# Patient Record
Sex: Male | Born: 1965 | Race: Black or African American | Hispanic: No | Marital: Married | Smoking: Never smoker
Health system: Southern US, Community
[De-identification: ages and names within clinical notes are randomized; demographics above are authoritative.]

## PROBLEM LIST (undated history)

## (undated) DIAGNOSIS — L03119 Cellulitis of unspecified part of limb: Secondary | ICD-10-CM

## (undated) DIAGNOSIS — K5792 Diverticulitis of intestine, part unspecified, without perforation or abscess without bleeding: Secondary | ICD-10-CM

## (undated) DIAGNOSIS — I10 Essential (primary) hypertension: Secondary | ICD-10-CM

## (undated) DIAGNOSIS — L02419 Cutaneous abscess of limb, unspecified: Secondary | ICD-10-CM

## (undated) HISTORY — PX: COLON SURGERY: SHX602

---

## 1999-11-16 ENCOUNTER — Emergency Department (HOSPITAL_COMMUNITY): Admission: EM | Admit: 1999-11-16 | Discharge: 1999-11-16 | Payer: Self-pay | Admitting: Emergency Medicine

## 1999-11-16 ENCOUNTER — Encounter: Payer: Self-pay | Admitting: Emergency Medicine

## 2012-04-19 ENCOUNTER — Encounter (HOSPITAL_BASED_OUTPATIENT_CLINIC_OR_DEPARTMENT_OTHER): Payer: Self-pay | Admitting: *Deleted

## 2012-04-19 ENCOUNTER — Emergency Department (HOSPITAL_BASED_OUTPATIENT_CLINIC_OR_DEPARTMENT_OTHER)
Admission: EM | Admit: 2012-04-19 | Discharge: 2012-04-19 | Disposition: A | Discharge status: Home / Self Care | Payer: Managed Care, Other (non HMO) | Attending: Emergency Medicine | Admitting: Emergency Medicine

## 2012-04-19 ENCOUNTER — Emergency Department (HOSPITAL_BASED_OUTPATIENT_CLINIC_OR_DEPARTMENT_OTHER): Payer: Managed Care, Other (non HMO)

## 2012-04-19 DIAGNOSIS — E119 Type 2 diabetes mellitus without complications: Secondary | ICD-10-CM | POA: Insufficient documentation

## 2012-04-19 DIAGNOSIS — N498 Inflammatory disorders of other specified male genital organs: Secondary | ICD-10-CM | POA: Insufficient documentation

## 2012-04-19 DIAGNOSIS — N492 Inflammatory disorders of scrotum: Secondary | ICD-10-CM

## 2012-04-19 DIAGNOSIS — I10 Essential (primary) hypertension: Secondary | ICD-10-CM | POA: Insufficient documentation

## 2012-04-19 HISTORY — DX: Diverticulitis of intestine, part unspecified, without perforation or abscess without bleeding: K57.92

## 2012-04-19 HISTORY — DX: Essential (primary) hypertension: I10

## 2012-04-19 LAB — URINALYSIS, ROUTINE W REFLEX MICROSCOPIC
Bilirubin Urine: NEGATIVE
Glucose, UA: >1000 mg/dL — AB
Ketones, ur: NEGATIVE mg/dL
Nitrite: NEGATIVE
Protein, ur: NEGATIVE mg/dL
Specific Gravity, Urine: 1.033 — ABNORMAL HIGH (ref 1.005–1.030)
Urobilinogen, UA: 1 mg/dL (ref 0.0–1.0)
pH: 5.5 (ref 5.0–8.0)

## 2012-04-19 LAB — URINE MICROSCOPIC-ADD ON

## 2012-04-19 MED ORDER — CLONIDINE HCL 0.1 MG PO TABS
0.1000 mg | ORAL_TABLET | Freq: Once | ORAL | Status: AC
  Administered 2012-04-19: 0.1 mg via ORAL
  Filled 2012-04-19: qty 1

## 2012-04-19 MED ORDER — SULFAMETHOXAZOLE-TRIMETHOPRIM 800-160 MG PO TABS: 1.0000 | ORAL_TABLET | Freq: Two times a day (BID) | ORAL | Status: AC

## 2012-04-19 MED ORDER — LIDOCAINE-EPINEPHRINE 2 %-1:100000 IJ SOLN: 20.0000 mL | Freq: Once | INTRAMUSCULAR | Status: DC

## 2012-04-19 MED ORDER — AMLODIPINE BESYLATE 5 MG PO TABS
5.0000 mg | ORAL_TABLET | Freq: Once | ORAL | Status: AC
  Administered 2012-04-19: 5 mg via ORAL
  Filled 2012-04-19: qty 1

## 2012-04-19 MED ORDER — HYDROCODONE-ACETAMINOPHEN 5-325 MG PO TABS
2.0000 | ORAL_TABLET | Freq: Once | ORAL | Status: AC
  Administered 2012-04-19: 2 via ORAL
  Filled 2012-04-19: qty 2

## 2012-04-19 MED ORDER — HYDROCODONE-ACETAMINOPHEN 5-500 MG PO TABS: 1.0000 | ORAL_TABLET | Freq: Four times a day (QID) | ORAL | Status: AC | PRN

## 2012-04-19 MED ORDER — HYDROMORPHONE HCL PF 1 MG/ML IJ SOLN
1.0000 mg | Freq: Once | INTRAMUSCULAR | Status: AC
  Administered 2012-04-19: 1 mg via INTRAMUSCULAR
  Filled 2012-04-19: qty 1

## 2012-04-19 MED ORDER — SULFAMETHOXAZOLE-TMP DS 800-160 MG PO TABS
1.0000 | ORAL_TABLET | Freq: Once | ORAL | Status: AC
  Administered 2012-04-19: 1 via ORAL
  Filled 2012-04-19: qty 1

## 2012-04-19 MED ORDER — SULFAMETHOXAZOLE-TRIMETHOPRIM 800-160 MG PO TABS: 1.0000 | ORAL_TABLET | Freq: Two times a day (BID) | ORAL | Status: DC

## 2012-04-19 NOTE — Discharge Instructions (Signed)
Keep area of incision and drainage very clean/dry. Take motrin or aleve as need.  You may also take vicodin as need for pain. No driving for the next 6 hours or when taking vicodin. Also, do not take tylenol or acetaminophen containing medication when taking vicodin.  Return to your doctor, or here, this Monday morning for recheck and possible packing removal.  Return to ER right away if worse, increased swelling, spreading redness, worsening or severe pain, fevers, severe headache, other concern.  Your blood pressure is high - take one extra of your sular tablets each day for the next couple days, and follow up with primary care doctor for recheck this Monday.      Incision and Drainage Incision and drainage (I&D) is a procedure in which a cavity-like structure (cystic structure) is opened and drained. The cyst to be drained usually contains material such as pus, fluid, or blood. Gauze is sometimes packed into the cut (incision). Keeping a drain or piece of gauze in the incision keeps the skin from healing first. This helps stop the cyst from forming again. HOME CARE INSTRUCTIONS   Only take over-the-counter or prescription medicines for pain, discomfort, or fever as directed by your caregiver. Use these only if your caregiver has not given medicines that would interfere.   See your caregiver as directed for a recheck.   If medicines (antibiotics) that kill germs were prescribed, take them as directed.  SEEK MEDICAL CARE IF:   You develop increased pain, swelling, redness, drainage, or bleeding in the wound.   You develop signs of an infection. These signs include muscle aches, chills, or a general ill feeling.   You have a fever.  MAKE SURE YOU:   Understand these instructions.   Will watch your condition.   Will get help right away if you are not doing well or get worse.  Document Released: 05/15/2001 Document Revised: 11/08/2011 Document Reviewed: 07/09/2008 [facility] Patient  Information 2012 [place], [place].      Abscess An abscess (boil or furuncle) is an infected area that contains a collection of pus.  SYMPTOMS Signs and symptoms of an abscess include pain, tenderness, redness, or hardness. You may feel a moveable soft area under your skin. An abscess can occur anywhere in the body.  TREATMENT  A surgical cut (incision) may be made over your abscess to drain the pus. Gauze may be packed into the space or a drain may be looped through the abscess cavity (pocket). This provides a drain that will allow the cavity to heal from the inside outwards. The abscess may be painful for a few days, but should feel much better if it was drained.  Your abscess, if seen early, may not have localized and may not have been drained. If not, another appointment may be required if it does not get better on its own or with medications. HOME CARE INSTRUCTIONS   Only take over-the-counter or prescription medicines for pain, discomfort, or fever as directed by your caregiver.   Take your antibiotics as directed if they were prescribed. Finish them even if you start to feel better.   Keep the skin and clothes clean around your abscess.   If the abscess was drained, you will need to use gauze dressing to collect any draining pus. Dressings will typically need to be changed 3 or more times a day.   The infection may spread by skin contact with others. Avoid skin contact as much as possible.   Practice good  hygiene. This includes regular hand washing, cover any draining skin lesions, and do not share personal care items.   If you participate in sports, do not share athletic equipment, towels, whirlpools, or personal care items. Shower after every practice or tournament.   If a draining area cannot be adequately covered:   Do not participate in sports.   Children should not participate in day care until the wound has healed or drainage stops.   If your caregiver has given you a  follow-up appointment, it is very important to keep that appointment. Not keeping the appointment could result in a much worse infection, chronic or permanent injury, pain, and disability. If there is any problem keeping the appointment, you must call back to this facility for assistance.  SEEK MEDICAL CARE IF:   You develop increased pain, swelling, redness, drainage, or bleeding in the wound site.   You develop signs of generalized infection including muscle aches, chills, fever, or a general ill feeling.   You have an oral temperature above 102 F (38.9 C).  MAKE SURE YOU:   Understand these instructions.   Will watch your condition.   Will get help right away if you are not doing well or get worse.  Document Released: 08/29/2005 Document Revised: 11/08/2011 Document Reviewed: 06/22/2008 [facility] Patient Information 2012 [place], [place].      Hypertension As your heart beats, it forces blood through your arteries. This force is your blood pressure. If the pressure is too high, it is called hypertension (HTN) or high blood pressure. HTN is dangerous because you may have it and not know it. High blood pressure may mean that your heart has to work harder to pump blood. Your arteries may be narrow or stiff. The extra work puts you at risk for heart disease, stroke, and other problems.  Blood pressure consists of two numbers, a higher number over a lower, 110/72, for example. It is stated as "110 over 72." The ideal is below 120 for the top number (systolic) and under 80 for the bottom (diastolic). Write down your blood pressure today. You should pay close attention to your blood pressure if you have certain conditions such as:  Heart failure.   Prior heart attack.   Diabetes   Chronic kidney disease.   Prior stroke.   Multiple risk factors for heart disease.  To see if you have HTN, your blood pressure should be measured while you are seated with your arm held at the level of  the heart. It should be measured at least twice. A one-time elevated blood pressure reading (especially in the Emergency Department) does not mean that you need treatment. There may be conditions in which the blood pressure is different between your right and left arms. It is important to see your caregiver soon for a recheck. Most people have essential hypertension which means that there is not a specific cause. This type of high blood pressure may be lowered by changing lifestyle factors such as:  Stress.   Smoking.   Lack of exercise.   Excessive weight.   Drug/tobacco/alcohol use.   Eating less salt.  Most people do not have symptoms from high blood pressure until it has caused damage to the body. Effective treatment can often prevent, delay or reduce that damage. TREATMENT  When a cause has been identified, treatment for high blood pressure is directed at the cause. There are a large number of medications to treat HTN. These fall into several categories, and your  caregiver will help you select the medicines that are best for you. Medications may have side effects. You should review side effects with your caregiver. If your blood pressure stays high after you have made lifestyle changes or started on medicines,   Your medication(s) may need to be changed.   Other problems may need to be addressed.   Be certain you understand your prescriptions, and know how and when to take your medicine.   Be sure to follow up with your caregiver within the time frame advised (usually within two weeks) to have your blood pressure rechecked and to review your medications.   If you are taking more than one medicine to lower your blood pressure, make sure you know how and at what times they should be taken. Taking two medicines at the same time can result in blood pressure that is too low.  SEEK IMMEDIATE MEDICAL CARE IF:  You develop a severe headache, blurred or changing vision, or confusion.    You have unusual weakness or numbness, or a faint feeling.   You have severe chest or abdominal pain, vomiting, or breathing problems.  MAKE SURE YOU:   Understand these instructions.   Will watch your condition.   Will get help right away if you are not doing well or get worse.  Document Released: 11/19/2005 Document Revised: 11/08/2011 Document Reviewed: 07/09/2008 [facility] Patient Information 2012 [place], [place].

## 2012-04-19 NOTE — ED Notes (Signed)
Suture cart at bedside 

## 2012-04-19 NOTE — ED Notes (Signed)
Informed EDP of pt. b/p

## 2012-04-19 NOTE — ED Provider Notes (Addendum)
History   This chart was scribed for Suzi Roots, MD by Shari Heritage. The patient was seen in room MH06/MH06. Patient's care was started at 1433.     CSN: [id number]  Arrival date & time 04/19/12  1433   First MD Initiated Contact with Patient 04/19/12 1457      Chief Complaint  Patient presents with  . Testicle Pain    (Consider location/radiation/quality/duration/timing/severity/associated sxs/prior treatment) The history is provided by the patient. No language interpreter was used.   [name] [name] is a 46 y.o. male who presents to the Emergency Department complaining of moderate testicular pain onset yesterday associated with abdominal pain under pressure. Patient says that the pain worsens when he is sitting and with palpation. Patient says that the pain onset gradually and worsened slowly. Pain is non-radiating and is a constant, dull pain. Patient says that he went to [facility] - was referred here for ultrasound. Patient denies dysuria, hematuria, or excessive urination. Patient is allergic to minocycline. Patient denies HA, chest pain, neck pain. Patient w/o history of testicular conditions or epididymitis. Patient claims he has never had an abscess or boil. Patient also with h/o of diabetes, diverticulitis and HTN. Patient has never smoked.     Past Medical History  Diagnosis Date  . Hypertension   . Diabetes mellitus   . Diverticulitis     Past Surgical History  Procedure Date  . Colon surgery     History reviewed. No pertinent family history.  History  Substance Use Topics  . Smoking status: Never Smoker   . Smokeless tobacco: Not on file  . Alcohol Use: Yes      Review of Systems  Constitutional: Negative for fever.  HENT: Negative for neck pain.   Eyes: Negative for pain.  Respiratory: Negative for shortness of breath.   Cardiovascular: Negative for chest pain.  Gastrointestinal: Negative for abdominal pain.  Genitourinary: Negative for flank pain  and genital sores.  Musculoskeletal: Negative for back pain.  Skin: Negative for rash.  Neurological: Negative for headaches.  Hematological: Negative for adenopathy.  Psychiatric/Behavioral: Negative for confusion.    Allergies  Minocycline  Home Medications   Current Outpatient Rx  Name Route Sig Dispense Refill  . METFORMIN HCL 500 MG PO TABS Oral Take 500 mg by mouth once.    Marland Kitchen NISOLDIPINE ER 17 MG PO TB24 Oral Take 17 mg by mouth daily.      BP 217/113  Pulse 99  Temp(Src) 98.6 F (37 C) (Oral)  Resp 18  Ht 6\' 3"  (1.905 m)  Wt 370 lb (167.831 kg)  BMI 46.25 kg/m2  SpO2 97%  Physical Exam  Nursing note and vitals reviewed. Constitutional: He is oriented to person, place, and time. He appears well-developed and well-nourished. No distress.  HENT:  Head: Atraumatic.  Eyes: Pupils are equal, round, and reactive to light.  Neck: Neck supple. No tracheal deviation present.  Cardiovascular: Normal rate.   Pulmonary/Chest: Effort normal. No accessory muscle usage. No respiratory distress.  Abdominal: Soft. He exhibits no distension. There is no tenderness.  Genitourinary:       Normal external genitalia. Testes desc bil. Left testicle and epididymus diffusely swollen and tender. No penile discharge. No ulcerations or skin lesions.   Musculoskeletal: Normal range of motion.  Neurological: He is alert and oriented to person, place, and time.  Skin: Skin is warm and dry.  Psychiatric: He has a normal mood and affect.    ED Course  Procedures (  including critical care time) DIAGNOSTIC STUDIES: Oxygen Saturation is 95% on room air, normal by my interpretation.    COORDINATION OF CARE: 3:04PM- Patient informed of current plan for treatment and evaluation and agrees with plan at this time.     Labs Reviewed  URINALYSIS, ROUTINE W REFLEX MICROSCOPIC - Abnormal; Notable for the following:    Specific Gravity, Urine 1.033 (*)    Glucose, UA >1000 (*)    Hgb urine  dipstick SMALL (*)    Leukocytes, UA SMALL (*)    All other components within normal limits  URINE MICROSCOPIC-ADD ON - Abnormal; Notable for the following:    Squamous Epithelial / LPF FEW (*)    All other components within normal limits    Results for orders placed during the hospital encounter of 04/19/12  URINALYSIS, ROUTINE W REFLEX MICROSCOPIC      Component Value Range   Color, Urine YELLOW  YELLOW    APPearance CLEAR  CLEAR    Specific Gravity, Urine 1.033 (*) 1.005 - 1.030    pH 5.5  5.0 - 8.0    Glucose, UA >1000 (*) NEGATIVE (mg/dL)   Hgb urine dipstick SMALL (*) NEGATIVE    Bilirubin Urine NEGATIVE  NEGATIVE    Ketones, ur NEGATIVE  NEGATIVE (mg/dL)   Protein, ur NEGATIVE  NEGATIVE (mg/dL)   Urobilinogen, UA 1.0  0.0 - 1.0 (mg/dL)   Nitrite NEGATIVE  NEGATIVE    Leukocytes, UA SMALL (*) NEGATIVE   URINE MICROSCOPIC-ADD ON      Component Value Range   Squamous Epithelial / LPF FEW (*) RARE    WBC, UA 3-6  <3 (WBC/hpf)   RBC / HPF 0-2  <3 (RBC/hpf)   Bacteria, UA RARE  RARE    Urine-Other MUCOUS PRESENT     US Scrotum  04/19/2012  *RADIOLOGY REPORT*  Clinical Data:  46 year old male with left testicular pain.  SCROTAL ULTRASOUND DOPPLER ULTRASOUND OF THE TESTICLES  Technique: Complete ultrasound examination of the testicles, epididymis, and other scrotal structures was performed.  Color and spectral Doppler ultrasound were also utilized to evaluate blood flow to the testicles.  Comparison:  None  Findings:  Right testis:  The right testicle is normal in appearance and size without focal solid testicular lesions. Normal color flow and vascular wave forms are noted.  Left testis:  The left testicle is normal in appearance and size without focal solid testicular lesions. Normal color flow vascular wave forms are noted.  Right epididymis:  Normal in size and appearance.  Left epididymis:  Normal in size and appearance.  Hydocele:  Absent  Varicocele:  A mild right varicocele is  present.  Pulsed Doppler interrogation of both testes demonstrates low resistance flow bilaterally.  IMPRESSION: Normal testicles bilaterally - no evidence of testicular torsion.  Mild right varicocele.  No other significant abnormalities identified.  Original Report Authenticated By: Rosendo Gros, M.D.   [place] Art/ven Flow Abd Pelv Doppler  04/19/2012  *RADIOLOGY REPORT*  Clinical Data:  46 year old male with left testicular pain.  SCROTAL ULTRASOUND DOPPLER ULTRASOUND OF THE TESTICLES  Technique: Complete ultrasound examination of the testicles, epididymis, and other scrotal structures was performed.  Color and spectral Doppler ultrasound were also utilized to evaluate blood flow to the testicles.  Comparison:  None  Findings:  Right testis:  The right testicle is normal in appearance and size without focal solid testicular lesions. Normal color flow and vascular wave forms are noted.  Left testis:  The left  testicle is normal in appearance and size without focal solid testicular lesions. Normal color flow vascular wave forms are noted.  Right epididymis:  Normal in size and appearance.  Left epididymis:  Normal in size and appearance.  Hydocele:  Absent  Varicocele:  A mild right varicocele is present.  Pulsed Doppler interrogation of both testes demonstrates low resistance flow bilaterally.  IMPRESSION: Normal testicles bilaterally - no evidence of testicular torsion.  Mild right varicocele.  No other significant abnormalities identified.  Original Report Authenticated By: Rosendo Gros, M.D.     MDM   vicodin po.   Ultrasound. Recheck bp.   Testicular u/s results noted.   Bedside u/s, appears to have soft tissue abscess lateral edge scrotum.    INCISION AND DRAINAGE Performed by: Suzi Roots Consent: Verbal consent obtained. Risks and benefits: risks, benefits and alternatives were discussed Type: abscess  Body area: left scrotum  Anesthesia: local infiltration  Local anesthetic:  lidocaine 2% w epinephrine  Anesthetic total: 7 ml  Complexity: complex Blunt dissection to break up loculations  Drainage: purulent  Drainage amount: large, bloody pus  Packing material: 1/4 in iodoform gauze  Patient tolerance: Patient tolerated the procedure well with no immediate complications.     Pt tolerated procedure well, sterile gauze dressing.   Bactrim ds po. Dilaudid 1 mg im.    Recheck pain improved. bp remains high. Pt states compliant w bp med.  States despite current bp med, bp tends to run quite high.  No headache, no cp, no sob, no edema.  Clonidine .1 mg po.   Recheck pt.   I personally performed the services described in this documentation, which was scribed in my presence. The recorded information has been reviewed and considered. Suzi Roots, MD  Recheck pt, no headache, no neuro c/o. No sob or cp. Pt states feels fine, requests d/c.   Pt is large individual, had normal bp cuff on, i rechecked w large, appropriately fitting cuff, bp 190/103.   Discussed need close pcp follow up with pt.   Suzi Roots, MD 04/19/12 1927  Suzi Roots, MD 04/19/12 2055

## 2012-04-19 NOTE — ED Notes (Signed)
Pt sent here from [facility] for ultrasound r/o torsion vs epididymitis. Pain since Thursday. Worse since last night.

## 2012-04-20 LAB — URINE CULTURE: Culture  Setup Time: 201305190226

## 2012-04-21 NOTE — ED Notes (Signed)
+   urine Chart sent to EDP office for review. 

## 2014-01-08 ENCOUNTER — Emergency Department (HOSPITAL_BASED_OUTPATIENT_CLINIC_OR_DEPARTMENT_OTHER): Payer: Managed Care, Other (non HMO)

## 2014-01-08 ENCOUNTER — Encounter (HOSPITAL_BASED_OUTPATIENT_CLINIC_OR_DEPARTMENT_OTHER): Payer: Self-pay | Admitting: Emergency Medicine

## 2014-01-08 ENCOUNTER — Inpatient Hospital Stay (HOSPITAL_COMMUNITY): Payer: Managed Care, Other (non HMO)

## 2014-01-08 ENCOUNTER — Inpatient Hospital Stay (HOSPITAL_BASED_OUTPATIENT_CLINIC_OR_DEPARTMENT_OTHER)
Admission: EM | Admit: 2014-01-08 | Discharge: 2014-01-19 | DRG: 853 | Disposition: A | Discharge status: Home Health | Payer: Managed Care, Other (non HMO) | Attending: Internal Medicine | Admitting: Internal Medicine

## 2014-01-08 DIAGNOSIS — Z833 Family history of diabetes mellitus: Secondary | ICD-10-CM

## 2014-01-08 DIAGNOSIS — Z8249 Family history of ischemic heart disease and other diseases of the circulatory system: Secondary | ICD-10-CM

## 2014-01-08 DIAGNOSIS — E119 Type 2 diabetes mellitus without complications: Secondary | ICD-10-CM | POA: Diagnosis present

## 2014-01-08 DIAGNOSIS — E1165 Type 2 diabetes mellitus with hyperglycemia: Secondary | ICD-10-CM | POA: Diagnosis present

## 2014-01-08 DIAGNOSIS — E1149 Type 2 diabetes mellitus with other diabetic neurological complication: Secondary | ICD-10-CM | POA: Diagnosis present

## 2014-01-08 DIAGNOSIS — M9080 Osteopathy in diseases classified elsewhere, unspecified site: Secondary | ICD-10-CM | POA: Diagnosis present

## 2014-01-08 DIAGNOSIS — I10 Essential (primary) hypertension: Secondary | ICD-10-CM | POA: Diagnosis present

## 2014-01-08 DIAGNOSIS — Z6841 Body Mass Index (BMI) 40.0 and over, adult: Secondary | ICD-10-CM

## 2014-01-08 DIAGNOSIS — IMO0002 Reserved for concepts with insufficient information to code with codable children: Secondary | ICD-10-CM | POA: Diagnosis present

## 2014-01-08 DIAGNOSIS — L02619 Cutaneous abscess of unspecified foot: Secondary | ICD-10-CM | POA: Diagnosis present

## 2014-01-08 DIAGNOSIS — M726 Necrotizing fasciitis: Secondary | ICD-10-CM | POA: Diagnosis present

## 2014-01-08 DIAGNOSIS — E871 Hypo-osmolality and hyponatremia: Secondary | ICD-10-CM | POA: Diagnosis present

## 2014-01-08 DIAGNOSIS — L03119 Cellulitis of unspecified part of limb: Secondary | ICD-10-CM

## 2014-01-08 DIAGNOSIS — L0291 Cutaneous abscess, unspecified: Secondary | ICD-10-CM | POA: Diagnosis present

## 2014-01-08 DIAGNOSIS — Z79899 Other long term (current) drug therapy: Secondary | ICD-10-CM

## 2014-01-08 DIAGNOSIS — E1142 Type 2 diabetes mellitus with diabetic polyneuropathy: Secondary | ICD-10-CM | POA: Diagnosis present

## 2014-01-08 DIAGNOSIS — I161 Hypertensive emergency: Secondary | ICD-10-CM | POA: Diagnosis present

## 2014-01-08 DIAGNOSIS — Z888 Allergy status to other drugs, medicaments and biological substances status: Secondary | ICD-10-CM

## 2014-01-08 DIAGNOSIS — M869 Osteomyelitis, unspecified: Secondary | ICD-10-CM | POA: Diagnosis present

## 2014-01-08 DIAGNOSIS — A480 Gas gangrene: Secondary | ICD-10-CM | POA: Diagnosis present

## 2014-01-08 DIAGNOSIS — E876 Hypokalemia: Secondary | ICD-10-CM | POA: Diagnosis not present

## 2014-01-08 DIAGNOSIS — E1169 Type 2 diabetes mellitus with other specified complication: Secondary | ICD-10-CM

## 2014-01-08 DIAGNOSIS — A419 Sepsis, unspecified organism: Principal | ICD-10-CM | POA: Diagnosis present

## 2014-01-08 DIAGNOSIS — L97409 Non-pressure chronic ulcer of unspecified heel and midfoot with unspecified severity: Secondary | ICD-10-CM | POA: Diagnosis present

## 2014-01-08 DIAGNOSIS — Z794 Long term (current) use of insulin: Secondary | ICD-10-CM

## 2014-01-08 DIAGNOSIS — E1159 Type 2 diabetes mellitus with other circulatory complications: Secondary | ICD-10-CM | POA: Diagnosis present

## 2014-01-08 DIAGNOSIS — Z91013 Allergy to seafood: Secondary | ICD-10-CM

## 2014-01-08 DIAGNOSIS — L0390 Cellulitis, unspecified: Secondary | ICD-10-CM | POA: Diagnosis present

## 2014-01-08 DIAGNOSIS — L02419 Cutaneous abscess of limb, unspecified: Secondary | ICD-10-CM

## 2014-01-08 DIAGNOSIS — N179 Acute kidney failure, unspecified: Secondary | ICD-10-CM | POA: Diagnosis present

## 2014-01-08 HISTORY — DX: Cutaneous abscess of limb, unspecified: L02.419

## 2014-01-08 HISTORY — DX: Cellulitis of unspecified part of limb: L03.119

## 2014-01-08 LAB — PROTIME-INR
INR: 1.16 (ref 0.00–1.49)
Prothrombin Time: 14.6 seconds (ref 11.6–15.2)

## 2014-01-08 LAB — CBC WITH DIFFERENTIAL/PLATELET
BASOS ABS: 0 10*3/uL (ref 0.0–0.1)
BASOS PCT: 0 % (ref 0–1)
EOS ABS: 0 10*3/uL (ref 0.0–0.7)
Eosinophils Relative: 0 % (ref 0–5)
HCT: 36.3 % — ABNORMAL LOW (ref 39.0–52.0)
HEMOGLOBIN: 12.1 g/dL — AB (ref 13.0–17.0)
LYMPHS PCT: 4 % — AB (ref 12–46)
Lymphs Abs: 1.5 10*3/uL (ref 0.7–4.0)
MCH: 26.4 pg (ref 26.0–34.0)
MCHC: 33.3 g/dL (ref 30.0–36.0)
MCV: 79.1 fL (ref 78.0–100.0)
MONOS PCT: 9 % (ref 3–12)
Monocytes Absolute: 3.4 10*3/uL — ABNORMAL HIGH (ref 0.1–1.0)
NEUTROS PCT: 87 % — AB (ref 43–77)
Neutro Abs: 33.3 10*3/uL — ABNORMAL HIGH (ref 1.7–7.7)
Platelets: 344 10*3/uL (ref 150–400)
RBC: 4.59 MIL/uL (ref 4.22–5.81)
RDW: 14.2 % (ref 11.5–15.5)
WBC: 38.2 10*3/uL — AB (ref 4.0–10.5)

## 2014-01-08 LAB — GLUCOSE, CAPILLARY
Glucose-Capillary: 277 mg/dL — ABNORMAL HIGH (ref 70–99)
Glucose-Capillary: 220 mg/dL — ABNORMAL HIGH (ref 70–99)
Glucose-Capillary: 193 mg/dL — ABNORMAL HIGH (ref 70–99)
Glucose-Capillary: 206 mg/dL — ABNORMAL HIGH (ref 70–99)

## 2014-01-08 LAB — CREATININE, SERUM
CREATININE: 1.17 mg/dL (ref 0.50–1.35)
GFR, EST AFRICAN AMERICAN: 85 mL/min — AB (ref >90)
GFR, EST NON AFRICAN AMERICAN: 74 mL/min — AB (ref >90)

## 2014-01-08 LAB — COMPREHENSIVE METABOLIC PANEL
ALT: 12 U/L (ref 0–53)
AST: 11 U/L (ref 0–37)
Albumin: 2.6 g/dL — ABNORMAL LOW (ref 3.5–5.2)
Alkaline Phosphatase: 123 U/L — ABNORMAL HIGH (ref 39–117)
BUN: 24 mg/dL — ABNORMAL HIGH (ref 6–23)
CALCIUM: 9.4 mg/dL (ref 8.4–10.5)
CO2: 20 mEq/L (ref 19–32)
Chloride: 89 mEq/L — ABNORMAL LOW (ref 96–112)
Creatinine, Ser: 1.2 mg/dL (ref 0.50–1.35)
GFR calc Af Amer: 83 mL/min — ABNORMAL LOW (ref >90)
GFR calc non Af Amer: 72 mL/min — ABNORMAL LOW (ref >90)
Glucose, Bld: 299 mg/dL — ABNORMAL HIGH (ref 70–99)
POTASSIUM: 4.1 meq/L (ref 3.7–5.3)
SODIUM: 131 meq/L — AB (ref 137–147)
TOTAL PROTEIN: 8.3 g/dL (ref 6.0–8.3)
Total Bilirubin: 0.6 mg/dL (ref 0.3–1.2)

## 2014-01-08 LAB — CBC
HCT: 33.9 % — ABNORMAL LOW (ref 39.0–52.0)
Hemoglobin: 11.8 g/dL — ABNORMAL LOW (ref 13.0–17.0)
MCH: 27.3 pg (ref 26.0–34.0)
MCHC: 34.8 g/dL (ref 30.0–36.0)
MCV: 78.5 fL (ref 78.0–100.0)
PLATELETS: 356 10*3/uL (ref 150–400)
RBC: 4.32 MIL/uL (ref 4.22–5.81)
RDW: 14.2 % (ref 11.5–15.5)
WBC: 41.3 10*3/uL — AB (ref 4.0–10.5)

## 2014-01-08 LAB — HEMOGLOBIN A1C
Hgb A1c MFr Bld: 12.5 % — ABNORMAL HIGH (ref <5.7)
Mean Plasma Glucose: 312 mg/dL — ABNORMAL HIGH (ref <117)

## 2014-01-08 LAB — LACTIC ACID, PLASMA: Lactic Acid, Venous: 1.7 mmol/L (ref 0.5–2.2)

## 2014-01-08 MED ORDER — INSULIN DETEMIR 100 UNIT/ML ~~LOC~~ SOLN
10.0000 [IU] | Freq: Every day | SUBCUTANEOUS | Status: DC
  Administered 2014-01-08 – 2014-01-10 (×3): 10 [IU] via SUBCUTANEOUS
  Filled 2014-01-08 (×4): qty 0.1

## 2014-01-08 MED ORDER — CLONIDINE HCL 0.2 MG PO TABS
0.2000 mg | ORAL_TABLET | Freq: Every day | ORAL | Status: DC
  Administered 2014-01-08 – 2014-01-11 (×3): 0.2 mg via ORAL
  Filled 2014-01-08 (×4): qty 1

## 2014-01-08 MED ORDER — SODIUM CHLORIDE 0.9 % IV SOLN
Freq: Once | INTRAVENOUS | Status: AC
  Administered 2014-01-08: 12:00:00 via INTRAVENOUS

## 2014-01-08 MED ORDER — INSULIN ASPART 100 UNIT/ML ~~LOC~~ SOLN
4.0000 [IU] | Freq: Three times a day (TID) | SUBCUTANEOUS | Status: DC
  Administered 2014-01-09 – 2014-01-10 (×4): 4 [IU] via SUBCUTANEOUS

## 2014-01-08 MED ORDER — ACETAMINOPHEN 650 MG RE SUPP: 650.0000 mg | Freq: Four times a day (QID) | RECTAL | Status: DC | PRN

## 2014-01-08 MED ORDER — CLINDAMYCIN PHOSPHATE 600 MG/50ML IV SOLN
600.0000 mg | Freq: Once | INTRAVENOUS | Status: AC
  Administered 2014-01-08: 600 mg via INTRAVENOUS
  Filled 2014-01-08: qty 50

## 2014-01-08 MED ORDER — INSULIN ASPART 100 UNIT/ML ~~LOC~~ SOLN
0.0000 [IU] | Freq: Every day | SUBCUTANEOUS | Status: DC
  Administered 2014-01-08: 2 [IU] via SUBCUTANEOUS
  Administered 2014-01-09: 3 [IU] via SUBCUTANEOUS
  Administered 2014-01-10: 2 [IU] via SUBCUTANEOUS

## 2014-01-08 MED ORDER — ONDANSETRON HCL 4 MG/2ML IJ SOLN
4.0000 mg | Freq: Four times a day (QID) | INTRAMUSCULAR | Status: DC | PRN
  Filled 2014-01-08: qty 2

## 2014-01-08 MED ORDER — HYDROCODONE-ACETAMINOPHEN 5-325 MG PO TABS
1.0000 | ORAL_TABLET | ORAL | Status: DC | PRN
  Administered 2014-01-09 – 2014-01-13 (×5): 2 via ORAL
  Administered 2014-01-14: 1 via ORAL
  Filled 2014-01-08: qty 1
  Filled 2014-01-08 (×5): qty 2

## 2014-01-08 MED ORDER — SODIUM CHLORIDE 0.9 % IV SOLN
INTRAVENOUS | Status: AC
  Administered 2014-01-09 (×2): via INTRAVENOUS

## 2014-01-08 MED ORDER — POLYETHYLENE GLYCOL 3350 17 G PO PACK
17.0000 g | PACK | Freq: Every day | ORAL | Status: DC | PRN
  Filled 2014-01-08: qty 1

## 2014-01-08 MED ORDER — HYDRALAZINE HCL 20 MG/ML IJ SOLN
5.0000 mg | Freq: Four times a day (QID) | INTRAMUSCULAR | Status: DC | PRN
  Administered 2014-01-11 – 2014-01-17 (×4): 5 mg via INTRAVENOUS
  Filled 2014-01-08 (×5): qty 1

## 2014-01-08 MED ORDER — CLINDAMYCIN PHOSPHATE 600 MG/50ML IV SOLN
600.0000 mg | Freq: Three times a day (TID) | INTRAVENOUS | Status: DC
  Administered 2014-01-08 – 2014-01-13 (×15): 600 mg via INTRAVENOUS
  Filled 2014-01-08 (×18): qty 50

## 2014-01-08 MED ORDER — PIPERACILLIN-TAZOBACTAM 3.375 G IVPB
3.3750 g | Freq: Three times a day (TID) | INTRAVENOUS | Status: DC
  Administered 2014-01-08 – 2014-01-17 (×28): 3.375 g via INTRAVENOUS
  Filled 2014-01-08 (×32): qty 50

## 2014-01-08 MED ORDER — INSULIN ASPART 100 UNIT/ML ~~LOC~~ SOLN
0.0000 [IU] | Freq: Three times a day (TID) | SUBCUTANEOUS | Status: DC
  Administered 2014-01-08: 3 [IU] via SUBCUTANEOUS
  Administered 2014-01-09: 8 [IU] via SUBCUTANEOUS
  Administered 2014-01-10 – 2014-01-11 (×4): 5 [IU] via SUBCUTANEOUS
  Administered 2014-01-11: 3 [IU] via SUBCUTANEOUS
  Administered 2014-01-11: 5 [IU] via SUBCUTANEOUS
  Administered 2014-01-12: 2 [IU] via SUBCUTANEOUS
  Administered 2014-01-12: 3 [IU] via SUBCUTANEOUS
  Administered 2014-01-12 – 2014-01-13 (×3): 2 [IU] via SUBCUTANEOUS
  Administered 2014-01-13: 3 [IU] via SUBCUTANEOUS
  Administered 2014-01-14: 2 [IU] via SUBCUTANEOUS
  Administered 2014-01-14: 3 [IU] via SUBCUTANEOUS
  Administered 2014-01-14: 2 [IU] via SUBCUTANEOUS
  Administered 2014-01-15 – 2014-01-16 (×4): 3 [IU] via SUBCUTANEOUS
  Administered 2014-01-17 (×2): 2 [IU] via SUBCUTANEOUS
  Administered 2014-01-18 (×2): 3 [IU] via SUBCUTANEOUS
  Administered 2014-01-19: 2 [IU] via SUBCUTANEOUS

## 2014-01-08 MED ORDER — VANCOMYCIN HCL IN DEXTROSE 1-5 GM/200ML-% IV SOLN
1000.0000 mg | Freq: Once | INTRAVENOUS | Status: AC
  Administered 2014-01-08: 1000 mg via INTRAVENOUS
  Filled 2014-01-08: qty 200

## 2014-01-08 MED ORDER — ONDANSETRON HCL 4 MG PO TABS: 4.0000 mg | ORAL_TABLET | Freq: Four times a day (QID) | ORAL | Status: DC | PRN

## 2014-01-08 MED ORDER — SODIUM CHLORIDE 0.9 % IV SOLN
INTRAVENOUS | Status: AC
  Administered 2014-01-08: 16:00:00 via INTRAVENOUS

## 2014-01-08 MED ORDER — NISOLDIPINE ER 17 MG PO TB24
17.0000 mg | ORAL_TABLET | Freq: Every day | ORAL | Status: DC
  Administered 2014-01-08 – 2014-01-11 (×3): 17 mg via ORAL
  Filled 2014-01-08 (×4): qty 1

## 2014-01-08 MED ORDER — HYDRALAZINE HCL 25 MG PO TABS
25.0000 mg | ORAL_TABLET | Freq: Two times a day (BID) | ORAL | Status: DC
  Administered 2014-01-08 – 2014-01-13 (×11): 25 mg via ORAL
  Filled 2014-01-08 (×15): qty 1

## 2014-01-08 MED ORDER — VANCOMYCIN HCL 10 G IV SOLR
1500.0000 mg | Freq: Two times a day (BID) | INTRAVENOUS | Status: DC
  Administered 2014-01-09 – 2014-01-12 (×7): 1500 mg via INTRAVENOUS
  Filled 2014-01-08 (×9): qty 1500

## 2014-01-08 MED ORDER — CHLORHEXIDINE GLUCONATE 0.12 % MT SOLN
15.0000 mL | Freq: Two times a day (BID) | OROMUCOSAL | Status: DC
  Administered 2014-01-08 – 2014-01-16 (×14): 15 mL via OROMUCOSAL
  Filled 2014-01-08 (×18): qty 15

## 2014-01-08 MED ORDER — BIOTENE DRY MOUTH MT LIQD
15.0000 mL | Freq: Two times a day (BID) | OROMUCOSAL | Status: DC
  Administered 2014-01-10 – 2014-01-16 (×9): 15 mL via OROMUCOSAL

## 2014-01-08 MED ORDER — HEPARIN SODIUM (PORCINE) 5000 UNIT/ML IJ SOLN
5000.0000 [IU] | Freq: Three times a day (TID) | INTRAMUSCULAR | Status: DC
  Administered 2014-01-08 (×2): 5000 [IU] via SUBCUTANEOUS
  Filled 2014-01-08 (×6): qty 1

## 2014-01-08 MED ORDER — GADOBENATE DIMEGLUMINE 529 MG/ML IV SOLN
20.0000 mL | Freq: Once | INTRAVENOUS | Status: AC | PRN
  Administered 2014-01-08: 20 mL via INTRAVENOUS

## 2014-01-08 MED ORDER — PIPERACILLIN-TAZOBACTAM 3.375 G IVPB
3.3750 g | Freq: Once | INTRAVENOUS | Status: AC
  Administered 2014-01-08: 3.375 g via INTRAVENOUS
  Filled 2014-01-08: qty 50

## 2014-01-08 MED ORDER — ACETAMINOPHEN 325 MG PO TABS: 650.0000 mg | ORAL_TABLET | Freq: Four times a day (QID) | ORAL | Status: DC | PRN

## 2014-01-08 MED ORDER — VANCOMYCIN HCL IN DEXTROSE 1-5 GM/200ML-% IV SOLN: 1000.0000 mg | Freq: Once | INTRAVENOUS | Status: DC

## 2014-01-08 NOTE — ED Notes (Signed)
[facility] at bedside preparing for transport 

## 2014-01-08 NOTE — Progress Notes (Signed)
[facility] to [phone]

## 2014-01-08 NOTE — Progress Notes (Signed)
1415: Pt arrived to [id number] via [facility].  1420: [facility] Admission paged and notified of pt's arrival to unit.

## 2014-01-08 NOTE — ED Notes (Signed)
Right foot has been wrapped with gause and sterile 4x4.

## 2014-01-08 NOTE — ED Provider Notes (Signed)
CSN: [id number]     Arrival date & time 01/08/14  1018 History   First MD Initiated Contact with Patient 01/08/14 1046     Chief Complaint  Patient presents with  . Foot Pain   (Consider location/radiation/quality/duration/timing/severity/associated sxs/prior Treatment) Patient is a 48 y.o. male presenting with lower extremity pain. The history is provided by the patient.  Foot Pain This is a new problem. Episode onset: 1 month. The problem occurs constantly. The problem has been rapidly worsening. Associated symptoms comments: About one month ago patient was on a two-week course of Cipro for a diabetic foot wound. He had been using Silvadene for the last 2 weeks and was restarted on Cipro on tues. Yesterday he noticed some increased redness in his foot but today the foot was red, swollen had blisters around his right great toe that were draining. Nothing aggravates the symptoms. Nothing relieves the symptoms. Treatments tried: abx. The treatment provided no relief.    Past Medical History  Diagnosis Date  . Hypertension   . Diabetes mellitus   . Diverticulitis    Past Surgical History  Procedure Laterality Date  . Colon surgery     No family history on file. History  Substance Use Topics  . Smoking status: Never Smoker   . Smokeless tobacco: Not on file  . Alcohol Use: Yes    Review of Systems  Constitutional: Negative for fever.  All other systems reviewed and are negative.    Allergies  Minocycline  Home Medications   Current Outpatient Rx  Name  Route  Sig  Dispense  Refill  . Canagliflozin 300 MG TABS   Oral   Take by mouth.         . cloNIDine (CATAPRES) 0.2 MG tablet   Oral   Take 0.2 mg by mouth daily.         . hydrALAZINE (APRESOLINE) 25 MG tablet   Oral   Take 25 mg by mouth 2 (two) times daily.         . Olmesartan-Amlodipine-HCTZ (TRIBENZOR) 40-5-25 MG TABS   Oral   Take by mouth.         . metFORMIN (GLUCOPHAGE) 500 MG tablet   Oral    Take 1,000 mg by mouth daily with breakfast.          . nisoldipine (SULAR) 17 MG 24 hr tablet   Oral   Take 17 mg by mouth daily.          BP 161/90  Pulse 104  Temp(Src) 98.3 F (36.8 C) (Oral)  Resp 18  SpO2 97% Physical Exam  Nursing note and vitals reviewed. Constitutional: He is oriented to person, place, and time. He appears well-developed and well-nourished. No distress.  HENT:  Head: Normocephalic and atraumatic.  Mouth/Throat: Oropharynx is clear and moist.  Eyes: Conjunctivae and EOM are normal. Pupils are equal, round, and reactive to light.  Neck: Normal range of motion. Neck supple.  Cardiovascular: Regular rhythm and intact distal pulses.  Tachycardia present.   No murmur heard. Pulmonary/Chest: Effort normal and breath sounds normal. No respiratory distress. He has no wheezes. He has no rales.  Abdominal: Soft. He exhibits no distension. There is no tenderness. There is no rebound and no guarding.  Musculoskeletal: Normal range of motion. He exhibits edema and tenderness.  Diabetic wound on the bottom of the right foot with pustular drainage and pain. Ruptured vesicles around the right great toe with warmth and erythema on the foot and  travel midway up the tib-fib. No calf tenderness. 2+ DP pulses. Decreased sensation over the right great toe. Sensation intact on dorsal and plantar surface of the foot  Neurological: He is alert and oriented to person, place, and time.  Skin: Skin is warm and dry. No rash noted. No erythema.  Psychiatric: He has a normal mood and affect. His behavior is normal.    ED Course  Procedures (including critical care time) Labs Review Labs Reviewed  GLUCOSE, CAPILLARY - Abnormal; Notable for the following:    Glucose-Capillary 277 (*)    All other components within normal limits  CBC WITH DIFFERENTIAL - Abnormal; Notable for the following:    WBC 38.2 (*)    Hemoglobin 12.1 (*)    HCT 36.3 (*)    All other components within  normal limits  COMPREHENSIVE METABOLIC PANEL   Imaging Review Dg Foot Complete Right  01/08/2014   CLINICAL DATA:  Prior history of soft tissue trauma.  EXAM: RIGHT FOOT COMPLETE - 3+ VIEW  COMPARISON:  None.  FINDINGS: Areas of subcutaneous emphysema project within the soft tissues between the distal aspect of the first and second digit as well as adjacent to the base of the distal phalanx and along the proximal phalanx of the first digit. No definite cortical destruction is appreciated. Considering the patient's history and extensive emphysematous changes surgical consultation, immediate is recommended. A necrotizing organism within the soft tissues cannot be excluded. There is otherwise no evidence of fracture nor dislocation.  IMPRESSION: Areas of subcutaneous emphysema within the soft tissues adjacent to the first and second digits as well as along the medial aspect of the first digit. Considering the patient's history and the radiographic findings immediate surgical consultation is recommended in etiologies such is a necrotizing organism cannot be excluded. There does not appear to be definite cortical destruction of the bone though if clinically warranted further evaluation with contrasted MRI is recommended These results were called by telephone at the time of interpretation on 01/08/2014 at 11:47 AM to Dr. Gwyneth SproutWHITNEY Taziah Difatta , who verbally acknowledged these results.   Electronically Signed   By: Salome HolmesHector  Cooper M.D.   On: 01/08/2014 11:48    EKG Interpretation   None       MDM   1. Cellulitis   2. Wound cellulitis     Patient with a diabetic wound to the bottom of his right foot with new cellulitis and swelling. Patient has been on Cipro intermittently for the last one month if symptoms are worsening. No prior history of diabetic wounds. He denies fever. Vital signs are stable. Patient will need admission for IV antibiotics. Vancomycin given. Plain film, CBC and CMP pending  11:52  AM Leukocytosis of 38,000. Plain film doubt osteomyelitis however areas of subcutaneous emphysema concerning for necrotizing organism. Will consult general surgery as well as medicine for admission and evaluation.  Ortho and triad will eval upon arrival.  abx broadened to zosyn and clinda  Gwyneth SproutWhitney Aristotelis Vilardi, MD 01/08/14 1222

## 2014-01-08 NOTE — Progress Notes (Signed)
ANTIBIOTIC CONSULT NOTE - INITIAL  Pharmacy Consult for Zosyn Indication: cellulitis and abscess/sepsis  Allergies  Allergen Reactions  . Minocycline Anaphylaxis    Patient Measurements: Height: 6\' 3"  (190.5 cm) Weight: 352 lb 11.2 oz (159.984 kg) IBW/kg (Calculated) : 84.5 Adjusted Body Weight: 115kg  Vital Signs: Temp: 99.4 F (37.4 C) (02/06 1422) Temp src: Oral (02/06 1422) BP: 185/101 mmHg (02/06 1422) Pulse Rate: 102 (02/06 1422) Intake/Output from previous day:   Intake/Output from this shift: Total I/O In: 250 [I.V.:250] Out: -   Labs:  Recent Labs  01/08/14 1115  WBC 38.2*  HGB 12.1*  PLT 344  CREATININE 1.20   Estimated Creatinine Clearance: 126.1 ml/min (by C-G formula based on Cr of 1.2). No results found for this basename: VANCOTROUGH, VANCOPEAK, VANCORANDOM, GENTTROUGH, GENTPEAK, GENTRANDOM, TOBRATROUGH, TOBRAPEAK, TOBRARND, AMIKACINPEAK, AMIKACINTROU, AMIKACIN,  in the last 72 hours   Microbiology: No results found for this or any previous visit (from the past 720 hour(s)).  Assessment: [age] YOM with history of DM who was on ciprofloxacin PTA for drainage of a wound on his foot. Did not improve and is now on IV antibiotics- vancomycin, clindamycin and Zosyn- has only received 1 dose at 1200 this afternoon.  SCr is 1.2mg /dL with est CrCl >161WR/UEA>100mL/min. WBC 38.2, Tmax 99.4. No results on blood cultures yet.  Goal of Therapy:  Eradication of infection  Plan:  1. Zosyn 3.375gm IV q8h extended interval dosing starting at 2100. 2. Follow c/s and narrow spectrum of therapy when able 3. Follow renal function and adjust dosing as needed  Jerricka Carvey D. Renna Kilmer, PharmD, BCPS Clinical Pharmacist Pager: [phone] 01/08/2014 3:26 PM

## 2014-01-08 NOTE — H&P (Addendum)
[facility] History and Physical  [name] [name] [name] DOB: [birth year] DOA: 01/08/2014  Referring physician: Dr. Anitra Lauth PCP: [name] Blamer, MD   Chief Complaint: foot drainage  HPI: [name] [name] is a 48 y.o. male  With past medical history of diabetes mellitus unknown hemoglobin A1c non-insulin-dependent hypertension that comes in for drainage. He relates this started about a month ago he was seen by his primary care doctor 2 weeks prior to admission and started on ciprofloxacin that but the drainage and the swelling progressively got worse to. Today he noticed increased redness coming up his foot and a blister around the great toe. He relates no cuts on this foot nothing makes it better or worse.  In the ED: Basic metabolic panel was done that shows mild hyponatremia with an elevated blood glucose of 300, CBC was done with a white count of 38 and a left shift of 33.3 foot x-ray shows area of soft tissue emphysema along the first and second toe, blood cultures were done and he was started empirically on vancomycin and Zosyn   Review of Systems:  Constitutional:  No weight loss, night sweats, Fevers, chills, fatigue.  HEENT:  No headaches, Difficulty swallowing,Tooth/dental problems,Sore throat,  No sneezing, itching, ear ache, nasal congestion, post nasal drip,  Cardio-vascular:  No chest pain, Orthopnea, PND, swelling in lower extremities, anasarca, dizziness, palpitations  GI:  No heartburn, indigestion, abdominal pain, nausea, vomiting, diarrhea, change in bowel habits, loss of appetite  Resp:  No shortness of breath with exertion or at rest. No excess mucus, no productive cough, No non-productive cough, No coughing up of blood.No change in color of mucus.No wheezing.No chest wall deformity  GU:  no dysuria, change in color of urine, no urgency or frequency. No flank pain.  Musculoskeletal:  No joint pain or swelling. No decreased range of motion. No back  pain.  Psych:  No change in mood or affect. No depression or anxiety. No memory loss.   Past Medical History  Diagnosis Date  . Hypertension   . Diabetes mellitus   . Diverticulitis    Past Surgical History  Procedure Laterality Date  . Colon surgery     Social History:  reports that he has never smoked. He does not have any smokeless tobacco history on file. He reports that he drinks alcohol. He reports that he does not use illicit drugs.  Allergies  Allergen Reactions  . Minocycline Anaphylaxis    Family History  Problem Relation Age of Onset  . Hypertension Mother   . Diabetes Mellitus II Father      Prior to Admission medications   Medication Sig Start Date End Date Taking? Authorizing Provider  Canagliflozin 300 MG TABS Take by mouth.   Yes Historical Provider, MD  cloNIDine (CATAPRES) 0.2 MG tablet Take 0.2 mg by mouth daily.   Yes Historical Provider, MD  hydrALAZINE (APRESOLINE) 25 MG tablet Take 25 mg by mouth 2 (two) times daily.   Yes Historical Provider, MD  Olmesartan-Amlodipine-HCTZ (TRIBENZOR) 40-5-25 MG TABS Take by mouth.   Yes Historical Provider, MD  metFORMIN (GLUCOPHAGE) 500 MG tablet Take 1,000 mg by mouth daily with breakfast.     Historical Provider, MD  nisoldipine (SULAR) 17 MG 24 hr tablet Take 17 mg by mouth daily.    Historical Provider, MD   Physical Exam: Filed Vitals:   01/08/14 1422  BP: 185/101  Pulse: 102  Temp: 99.4 F (37.4 C)  Resp: 18    BP 185/101  Pulse 102  Temp(Src) 99.4 F (37.4 C) (Oral)  Resp 18  SpO2 96%  General:  Appears calm and comfortable Eyes: PERRL, normal lids, irises & conjunctiva ENT: grossly normal hearing, lips & tongue Neck: no LAD, masses or thyromegaly Cardiovascular: RRR, no m/r/g. No LE edema. Telemetry: SR, no arrhythmias  Respiratory: CTA bilaterally, no w/r/r. Normal respiratory effort. Abdomen: soft, ntnd Skin/musculoskeletal: The right foot specially the first and second toe are swollen  redness extending past the ankle, there is a cut in the interim webbing of the first and second toe, with bloody drainage. there is a small ulcer along the head of the first toe there is some serous sanguinolent drainage. Not tender or warm to touch. The second toe is also edematous with this some blood. No answers are cut along the second or third toe or webbing, there is no pain with this movement of the joints. Psychiatric: grossly normal mood and affect, speech fluent and appropriate Neurologic: grossly non-focal.          Labs on Admission:  Basic Metabolic Panel:  Recent Labs Lab 01/08/14 1115  NA 131*  K 4.1  CL 89*  CO2 20  GLUCOSE 299*  BUN 24*  CREATININE 1.20  CALCIUM 9.4   Liver Function Tests:  Recent Labs Lab 01/08/14 1115  AST 11  ALT 12  ALKPHOS 123*  BILITOT 0.6  PROT 8.3  ALBUMIN 2.6*   No results found for this basename: LIPASE, AMYLASE,  in the last 168 hours No results found for this basename: AMMONIA,  in the last 168 hours CBC:  Recent Labs Lab 01/08/14 1115  WBC 38.2*  NEUTROABS 33.3*  HGB 12.1*  HCT 36.3*  MCV 79.1  PLT 344   Cardiac Enzymes: No results found for this basename: CKTOTAL, CKMB, CKMBINDEX, TROPONINI,  in the last 168 hours  BNP (last 3 results) No results found for this basename: PROBNP,  in the last 8760 hours CBG:  Recent Labs Lab 01/08/14 1045 01/08/14 1420  GLUCAP 277* 220*    Radiological Exams on Admission: Dg Foot Complete Right  01/08/2014   CLINICAL DATA:  Prior history of soft tissue trauma.  EXAM: RIGHT FOOT COMPLETE - 3+ VIEW  COMPARISON:  None.  FINDINGS: Areas of subcutaneous emphysema project within the soft tissues between the distal aspect of the first and second digit as well as adjacent to the base of the distal phalanx and along the proximal phalanx of the first digit. No definite cortical destruction is appreciated. Considering the patient's history and extensive emphysematous changes surgical  consultation, immediate is recommended. A necrotizing organism within the soft tissues cannot be excluded. There is otherwise no evidence of fracture nor dislocation.  IMPRESSION: Areas of subcutaneous emphysema within the soft tissues adjacent to the first and second digits as well as along the medial aspect of the first digit. Considering the patient's history and the radiographic findings immediate surgical consultation is recommended in etiologies such is a necrotizing organism cannot be excluded. There does not appear to be definite cortical destruction of the bone though if clinically warranted further evaluation with contrasted MRI is recommended These results were called by telephone at the time of interpretation on 01/08/2014 at 11:47 AM to Dr. Gwyneth SproutWHITNEY PLUNKETT , who verbally acknowledged these results.   Electronically Signed   By: Salome HolmesHector  Cooper M.D.   On: 01/08/2014 11:48    EKG: Independently reviewed. pending  Assessment/Plan Cellulitis and abscess/Sepsis: - Cont IV vancomycin, zosyn and clindamycin. -  Check lactic acid he does have an anion gap. - Tylenol for fever, MRI of right foot rule out osteomyelitis. - Consulted Dr. Charlann Boxer for possible I&D. - Cultures have been sent. - Zofran from nausea keep patient n.p.o. - Start IV fluids normal saline. - His low risk for cardiopulmonary complications. I discussed the risk and benefits of surgical procedures with him at this time I think it would be prudent to proceed with I&D.  Malignant hypertension - Resume home medications.  Diabetes mellitus, type 2: - He will be  n.p.o., start him on Levemir tomorrow morning and sliding scale. Check a hemoglobin A1c.  Orthopedic surgeon Dr. August Saucer  Code Status: full Family Communication: none Disposition Plan: inpatient  Time spent: 80 minutes  FELIZ Rosine Beat [facility] Pager [phone]
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
## 2014-01-08 NOTE — Consult Note (Signed)
Reason for Consult:  Right foot diabetic ulcer, infection Referring Physician:  David Stall, MD  [name] [name] is an 48 y.o. male.  HPI: 48 yo male with past medical history of diabetes mellitus unknown hemoglobin A1c non-insulin-dependent hypertension that comes in for drainage. He relates this started about a month ago.  He was seen by his primary care doctor 2 weeks prior to admission and started on ciprofloxacin that but the drainage and the swelling progressively got worse to. Today he noticed increased redness coming up his foot and a blister around the great toe. He relates no cuts on this foot nothing makes it better or worse.  In the ED:   He was admitted to the medical service due to condition of foot and uncontrolled diabetes.  Orthopaedics was consulted for management of his foot.  Basic metabolic panel was done that shows mild hyponatremia with an elevated blood glucose of 300, CBC was done with a white count of 38 and a left shift of 33.3 foot x-ray shows area of soft tissue emphysema along the first and second toe, blood cultures were done and he was started empirically on vancomycin and Zosyn     Past Medical History  Diagnosis Date  . Hypertension   . Diabetes mellitus   . Diverticulitis   . Cellulitis and abscess of leg 01/08/2014    RT LEG    Past Surgical History  Procedure Laterality Date  . Colon surgery      Family History  Problem Relation Age of Onset  . Hypertension Mother   . Diabetes Mellitus II Father     Social History:  reports that he has never smoked. He has never used smokeless tobacco. He reports that he drinks alcohol. He reports that he does not use illicit drugs.  Allergies:  Allergies  Allergen Reactions  . Minocycline Anaphylaxis  . Shellfish Allergy Nausea And Vomiting    Medications:  I have reviewed the patient's current medications. Scheduled: . sodium chloride   Intravenous STAT  . antiseptic oral rinse  15 mL Mouth Rinse  [id number]  . chlorhexidine  15 mL Mouth Rinse BID  . clindamycin (CLEOCIN) IV  600 mg Intravenous Q8H  . cloNIDine  0.2 mg Oral Daily  . heparin  5,000 Units Subcutaneous Q8H  . hydrALAZINE  25 mg Oral BID  . insulin aspart  0-15 Units Subcutaneous TID WC  . insulin aspart  0-5 Units Subcutaneous QHS  . insulin aspart  4 Units Subcutaneous TID WC  . insulin detemir  10 Units Subcutaneous QHS  . nisoldipine  17 mg Oral Daily  . piperacillin-tazobactam (ZOSYN)  IV  3.375 g Intravenous Q8H  . [START ON 01/09/2014] vancomycin  1,500 mg Intravenous Q12H    Results for orders placed during the hospital encounter of 01/08/14 (from the past 24 hour(s))  GLUCOSE, CAPILLARY     Status: Abnormal   Collection Time    01/08/14 10:45 AM      Result Value Range   Glucose-Capillary 277 (*) 70 - 99 mg/dL   Comment 1 Notify RN     Comment 2 Documented in Chart    CBC WITH DIFFERENTIAL     Status: Abnormal   Collection Time    01/08/14 11:15 AM      Result Value Range   WBC 38.2 (*) 4.0 - 10.5 K/uL   RBC 4.59  4.22 - 5.81 MIL/uL   Hemoglobin 12.1 (*) 13.0 - 17.0 g/dL   HCT 16.1 (*)  39.0 - 52.0 %   MCV 79.1  78.0 - 100.0 fL   MCH 26.4  26.0 - 34.0 pg   MCHC 33.3  30.0 - 36.0 g/dL   RDW 78.4  69.6 - 29.5 %   Platelets 344  150 - 400 K/uL   Neutrophils Relative % 87 (*) 43 - 77 %   Lymphocytes Relative 4 (*) 12 - 46 %   Monocytes Relative 9  3 - 12 %   Eosinophils Relative 0  0 - 5 %   Basophils Relative 0  0 - 1 %   Neutro Abs 33.3 (*) 1.7 - 7.7 K/uL   Lymphs Abs 1.5  0.7 - 4.0 K/uL   Monocytes Absolute 3.4 (*) 0.1 - 1.0 K/uL   Eosinophils Absolute 0.0  0.0 - 0.7 K/uL   Basophils Absolute 0.0  0.0 - 0.1 K/uL   RBC Morphology POLYCHROMASIA PRESENT     WBC Morphology MILD LEFT SHIFT (1-5% METAS, OCC MYELO, OCC BANDS)    COMPREHENSIVE METABOLIC PANEL     Status: Abnormal   Collection Time    01/08/14 11:15 AM      Result Value Range   Sodium 131 (*) 137 - 147 mEq/L   Potassium 4.1  3.7 - 5.3  mEq/L   Chloride 89 (*) 96 - 112 mEq/L   CO2 20  19 - 32 mEq/L   Glucose, Bld 299 (*) 70 - 99 mg/dL   BUN 24 (*) 6 - 23 mg/dL   Creatinine, Ser 2.84  0.50 - 1.35 mg/dL   Calcium 9.4  8.4 - 13.2 mg/dL   Total Protein 8.3  6.0 - 8.3 g/dL   Albumin 2.6 (*) 3.5 - 5.2 g/dL   AST 11  0 - 37 U/L   ALT 12  0 - 53 U/L   Alkaline Phosphatase 123 (*) 39 - 117 U/L   Total Bilirubin 0.6  0.3 - 1.2 mg/dL   GFR calc non Af Amer 72 (*) >90 mL/min   GFR calc Af Amer 83 (*) >90 mL/min  GLUCOSE, CAPILLARY     Status: Abnormal   Collection Time    01/08/14  2:20 PM      Result Value Range   Glucose-Capillary 220 (*) 70 - 99 mg/dL  CBC     Status: Abnormal   Collection Time    01/08/14  3:40 PM      Result Value Range   WBC 41.3 (*) 4.0 - 10.5 K/uL   RBC 4.32  4.22 - 5.81 MIL/uL   Hemoglobin 11.8 (*) 13.0 - 17.0 g/dL   HCT 44.0 (*) 10.2 - 72.5 %   MCV 78.5  78.0 - 100.0 fL   MCH 27.3  26.0 - 34.0 pg   MCHC 34.8  30.0 - 36.0 g/dL   RDW 36.6  44.0 - 34.7 %   Platelets 356  150 - 400 K/uL  CREATININE, SERUM     Status: Abnormal   Collection Time    01/08/14  3:40 PM      Result Value Range   Creatinine, Ser 1.17  0.50 - 1.35 mg/dL   GFR calc non Af Amer 74 (*) >90 mL/min   GFR calc Af Amer 85 (*) >90 mL/min  PROTIME-INR     Status: None   Collection Time    01/08/14  3:40 PM      Result Value Range   Prothrombin Time 14.6  11.6 - 15.2 seconds   INR 1.16  0.00 - 1.49  LACTIC ACID, PLASMA     Status: None   Collection Time    01/08/14  3:40 PM      Result Value Range   Lactic Acid, Venous 1.7  0.5 - 2.2 mmol/L  GLUCOSE, CAPILLARY     Status: Abnormal   Collection Time    01/08/14  5:09 PM      Result Value Range   Glucose-Capillary 193 (*) 70 - 99 mg/dL    X-ray: EXAM:  RIGHT FOOT COMPLETE - 3+ VIEW  COMPARISON: None.   FINDINGS:   Areas of subcutaneous emphysema project within the soft tissues  between the distal aspect of the first and second digit as well as  adjacent to  the base of the distal phalanx and along the proximal  phalanx of the first digit. No definite cortical destruction is  appreciated. Considering the patient's history and extensive  emphysematous changes surgical consultation, immediate is  recommended. A necrotizing organism within the soft tissues cannot  be excluded. There is otherwise no evidence of fracture nor  dislocation.   IMPRESSION:   Areas of subcutaneous emphysema within the soft tissues adjacent to  the first and second digits as well as along the medial aspect of  the first digit. Considering the patient's history and the  radiographic findings immediate surgical consultation is recommended  in etiologies such is a necrotizing organism cannot be excluded.  There does not appear to be definite cortical destruction of the  bone though if clinically warranted further evaluation with  contrasted MRI is recommended These results were called by telephone  at the time of interpretation on 01/08/2014 at 11:47 AM to Dr. Gwyneth SproutWHITNEY  PLUNKETT , who verbally acknowledged these results.   Electronically Signed  By: Salome HolmesHector Cooper M.D.  On: 01/08/2014 11:48   ROS Review of Systems:  Constitutional:  No weight loss, night sweats, Fevers, chills, fatigue.  HEENT:  No headaches, Difficulty swallowing,Tooth/dental problems,Sore throat,  No sneezing, itching, ear ache, nasal congestion, post nasal drip,  Cardio-vascular:  No chest pain, Orthopnea, PND, swelling in lower extremities, anasarca, dizziness, palpitations  GI:  No heartburn, indigestion, abdominal pain, nausea, vomiting, diarrhea, change in bowel habits, loss of appetite  Resp:  No shortness of breath with exertion or at rest. No excess mucus, no productive cough, No non-productive cough, No coughing up of blood.No change in color of mucus.No wheezing.No chest wall deformity  GU:  no dysuria, change in color of urine, no urgency or frequency. No flank pain.  Musculoskeletal:   Positive foot infection, blistering  Psych:  No change in mood or affect. No depression or anxiety. No memory loss.    Blood pressure 158/70, pulse 102, temperature 99.4 F (37.4 C), temperature source Oral, resp. rate 18, height 6\' 3"  (1.905 m), weight 159.984 kg (352 lb 11.2 oz), SpO2 96.00%.  Physical Exam Seen and evaluated tonight States comfortable but a bit weak feeling Food at bedside  Right foot exam: Foul smell Swelling, erythema and warm Skin breaks at great and second toes with bloody drainage no obvious pus  Palpable DP pulse  Assessment/Plan: Right diabetic foot ulcer involving medial forefoot Soft tissue abscess  MRI ordered by medical team appropriately to assess for osteomyelitis and its extent Will make NPO after midnight May have one of partners manage tomorrow as not emergent tonight and MRI yet to be complete  At time of note completion MRI completed but not interpreted by Radiologist  My review revealed no  significant bone involvement with more plantar based abscess, medial forefoot  Jayli Fogleman D 01/08/2014, 9:30 PM

## 2014-01-08 NOTE — ED Notes (Addendum)
Pt c/o right foot pain with swelling and drainage x 1 month. Pt sts he took 2 wk course of Cipro prescribed by Dr. Tiburcio PeaHarris without improvement. Pt sts he was then prescribed silvadene cream after that and was started on a second course of Cipro on 01/05/14. Serous drainage noted to sock in triage

## 2014-01-08 NOTE — ED Notes (Signed)
Pt returned from radiology.

## 2014-01-08 NOTE — Progress Notes (Signed)
UR completed. Longino Trefz RN CCM Case Mgmt phone [phone] 

## 2014-01-08 NOTE — ED Notes (Signed)
Report called to [facility].  Pt and family informed of plan of care.

## 2014-01-08 NOTE — ED Notes (Signed)
Warmth, redness, and swelling noted to foot, extending up right leg. Dr. Anitra LauthPlunkett at bedside.

## 2014-01-08 NOTE — ED Notes (Signed)
MD at bedside. 

## 2014-01-08 NOTE — Progress Notes (Signed)
ANTIBIOTIC CONSULT NOTE - INITIAL  Pharmacy Consult for vancomycin Indication: wound infection  Allergies  Allergen Reactions  . Minocycline Anaphylaxis    Patient Measurements:    Weight: 167.8 kg as of 04/19/12  Vital Signs: Temp: 98.3 F (36.8 C) (02/06 1031) Temp src: Oral (02/06 1031) BP: 161/90 mmHg (02/06 1031) Pulse Rate: 104 (02/06 1031) Intake/Output from previous day:   Intake/Output from this shift:    Labs:  Recent Labs  01/08/14 1115  WBC 38.2*  HGB 12.1*  PLT 344   CrCl is unknown because no creatinine reading has been taken. No results found for this basename: VANCOTROUGH, VANCOPEAK, VANCORANDOM, GENTTROUGH, GENTPEAK, GENTRANDOM, TOBRATROUGH, TOBRAPEAK, TOBRARND, AMIKACINPEAK, AMIKACINTROU, AMIKACIN,  in the last 72 hours   Microbiology: No results found for this or any previous visit (from the past 720 hour(s)).  Medical History: Past Medical History  Diagnosis Date  . Hypertension   . Diabetes mellitus   . Diverticulitis      Assessment: 48 yo man to start vancomycin per pharmacy for diabetic foot infection.  Per PMH he was on a 2 week course of cipro 1 month ago for diabetic foot infection.  He has been using silvadene for the last 2 weeks and was restarted on cipro on Tuesday 01/05/14.   Old wt in EPIC from 04/19/12 = 167.8 kg.  AF. WBC elevated at 38.2. Xray of foot pending.  Creat 1.2,   Goal of Therapy:  Vancomycin trough level 10-15 mcg/ml  Plan:  1. Vancomycin 2000 mg IV x 1 loading dose 2.vancomycin 1500 mg IV q12 maintenance dose per obesity nomogram 3. F/u weight, renal fxn, wbc, temp, culture data. 4. Check steady state vanco trough as needed.  Herby AbrahamMichelle T. Lorenzo Arscott, Pharm.D. [phone] 01/08/2014 11:51 AM

## 2014-01-09 ENCOUNTER — Encounter (HOSPITAL_COMMUNITY): Payer: Self-pay | Admitting: Certified Registered Nurse Anesthetist

## 2014-01-09 ENCOUNTER — Inpatient Hospital Stay (HOSPITAL_COMMUNITY): Payer: Managed Care, Other (non HMO) | Admitting: Anesthesiology

## 2014-01-09 ENCOUNTER — Encounter (HOSPITAL_COMMUNITY)
Admission: EM | Disposition: A | Discharge status: Home Health | Payer: Self-pay | Source: Home / Self Care | Attending: Internal Medicine

## 2014-01-09 ENCOUNTER — Encounter (HOSPITAL_COMMUNITY): Payer: Managed Care, Other (non HMO) | Admitting: Anesthesiology

## 2014-01-09 HISTORY — PX: I & D EXTREMITY: SHX5045

## 2014-01-09 LAB — CBC
HEMATOCRIT: 31.5 % — AB (ref 39.0–52.0)
Hemoglobin: 10.8 g/dL — ABNORMAL LOW (ref 13.0–17.0)
MCH: 26.9 pg (ref 26.0–34.0)
MCHC: 34.3 g/dL (ref 30.0–36.0)
MCV: 78.4 fL (ref 78.0–100.0)
Platelets: 328 10*3/uL (ref 150–400)
RBC: 4.02 MIL/uL — ABNORMAL LOW (ref 4.22–5.81)
RDW: 14.4 % (ref 11.5–15.5)
WBC: 35.3 10*3/uL — ABNORMAL HIGH (ref 4.0–10.5)

## 2014-01-09 LAB — COMPREHENSIVE METABOLIC PANEL
ALK PHOS: 103 U/L (ref 39–117)
ALT: 11 U/L (ref 0–53)
AST: 12 U/L (ref 0–37)
Albumin: 1.9 g/dL — ABNORMAL LOW (ref 3.5–5.2)
BUN: 30 mg/dL — ABNORMAL HIGH (ref 6–23)
CO2: 19 mEq/L (ref 19–32)
Calcium: 8.6 mg/dL (ref 8.4–10.5)
Chloride: 92 mEq/L — ABNORMAL LOW (ref 96–112)
Creatinine, Ser: 1.53 mg/dL — ABNORMAL HIGH (ref 0.50–1.35)
GFR calc non Af Amer: 53 mL/min — ABNORMAL LOW (ref >90)
GFR, EST AFRICAN AMERICAN: 62 mL/min — AB (ref >90)
GLUCOSE: 196 mg/dL — AB (ref 70–99)
POTASSIUM: 3.6 meq/L — AB (ref 3.7–5.3)
SODIUM: 132 meq/L — AB (ref 137–147)
TOTAL PROTEIN: 7.1 g/dL (ref 6.0–8.3)
Total Bilirubin: 0.5 mg/dL (ref 0.3–1.2)

## 2014-01-09 LAB — GLUCOSE, CAPILLARY
GLUCOSE-CAPILLARY: 251 mg/dL — AB (ref 70–99)
Glucose-Capillary: 185 mg/dL — ABNORMAL HIGH (ref 70–99)
Glucose-Capillary: 183 mg/dL — ABNORMAL HIGH (ref 70–99)
Glucose-Capillary: 211 mg/dL — ABNORMAL HIGH (ref 70–99)
Glucose-Capillary: 204 mg/dL — ABNORMAL HIGH (ref 70–99)

## 2014-01-09 LAB — MRSA PCR SCREENING: MRSA by PCR: NEGATIVE

## 2014-01-09 SURGERY — IRRIGATION AND DEBRIDEMENT EXTREMITY
Anesthesia: General | Site: Foot | Laterality: Right

## 2014-01-09 MED ORDER — MIDAZOLAM HCL 2 MG/2ML IJ SOLN
INTRAMUSCULAR | Status: AC
  Filled 2014-01-09: qty 2

## 2014-01-09 MED ORDER — ONDANSETRON HCL 4 MG PO TABS
4.0000 mg | ORAL_TABLET | Freq: Four times a day (QID) | ORAL | Status: DC | PRN
Start: 2014-01-09 — End: 2014-01-09

## 2014-01-09 MED ORDER — MIDAZOLAM HCL 5 MG/5ML IJ SOLN
INTRAMUSCULAR | Status: DC | PRN
  Administered 2014-01-09: 2 mg via INTRAVENOUS

## 2014-01-09 MED ORDER — ENOXAPARIN SODIUM 40 MG/0.4ML ~~LOC~~ SOLN
40.0000 mg | SUBCUTANEOUS | Status: DC
  Administered 2014-01-09 – 2014-01-14 (×6): 40 mg via SUBCUTANEOUS
  Filled 2014-01-09 (×7): qty 0.4

## 2014-01-09 MED ORDER — ONDANSETRON HCL 4 MG/2ML IJ SOLN
INTRAMUSCULAR | Status: AC
  Filled 2014-01-09: qty 2

## 2014-01-09 MED ORDER — GLYCOPYRROLATE 0.2 MG/ML IJ SOLN
INTRAMUSCULAR | Status: DC | PRN
  Administered 2014-01-09 (×2): 0.1 mg via INTRAVENOUS

## 2014-01-09 MED ORDER — PROPOFOL 10 MG/ML IV BOLUS
INTRAVENOUS | Status: AC
  Filled 2014-01-09: qty 20

## 2014-01-09 MED ORDER — LIDOCAINE HCL (CARDIAC) 20 MG/ML IV SOLN
INTRAVENOUS | Status: AC
  Filled 2014-01-09: qty 5

## 2014-01-09 MED ORDER — METOCLOPRAMIDE HCL 5 MG/ML IJ SOLN
5.0000 mg | Freq: Three times a day (TID) | INTRAMUSCULAR | Status: DC | PRN
  Filled 2014-01-09: qty 2

## 2014-01-09 MED ORDER — METOCLOPRAMIDE HCL 10 MG PO TABS: 5.0000 mg | ORAL_TABLET | Freq: Three times a day (TID) | ORAL | Status: DC | PRN

## 2014-01-09 MED ORDER — OXYCODONE HCL 5 MG/5ML PO SOLN: 5.0000 mg | Freq: Once | ORAL | Status: DC | PRN

## 2014-01-09 MED ORDER — PROPOFOL 10 MG/ML IV BOLUS
INTRAVENOUS | Status: DC | PRN
  Administered 2014-01-09: 200 mg via INTRAVENOUS

## 2014-01-09 MED ORDER — SODIUM CHLORIDE 0.9 % IR SOLN
Status: DC | PRN
  Administered 2014-01-09: 1000 mL

## 2014-01-09 MED ORDER — POTASSIUM CHLORIDE CRYS ER 10 MEQ PO TBCR
30.0000 meq | EXTENDED_RELEASE_TABLET | Freq: Once | ORAL | Status: AC
  Administered 2014-01-09: 30 meq via ORAL
  Filled 2014-01-09: qty 1

## 2014-01-09 MED ORDER — ONDANSETRON HCL 4 MG/2ML IJ SOLN: 4.0000 mg | Freq: Four times a day (QID) | INTRAMUSCULAR | Status: DC | PRN

## 2014-01-09 MED ORDER — LIDOCAINE HCL (CARDIAC) 20 MG/ML IV SOLN
INTRAVENOUS | Status: DC | PRN
  Administered 2014-01-09: 100 mg via INTRAVENOUS

## 2014-01-09 MED ORDER — DOCUSATE SODIUM 100 MG PO CAPS
100.0000 mg | ORAL_CAPSULE | Freq: Two times a day (BID) | ORAL | Status: DC
  Administered 2014-01-09 – 2014-01-19 (×12): 100 mg via ORAL
  Filled 2014-01-09 (×22): qty 1

## 2014-01-09 MED ORDER — GLYCOPYRROLATE 0.2 MG/ML IJ SOLN
INTRAMUSCULAR | Status: AC
  Filled 2014-01-09: qty 1

## 2014-01-09 MED ORDER — FENTANYL CITRATE 0.05 MG/ML IJ SOLN
INTRAMUSCULAR | Status: AC
  Filled 2014-01-09: qty 5

## 2014-01-09 MED ORDER — PROMETHAZINE HCL 25 MG/ML IJ SOLN: 6.2500 mg | INTRAMUSCULAR | Status: DC | PRN

## 2014-01-09 MED ORDER — OXYCODONE HCL 5 MG PO TABS: 5.0000 mg | ORAL_TABLET | Freq: Once | ORAL | Status: DC | PRN

## 2014-01-09 MED ORDER — FENTANYL CITRATE 0.05 MG/ML IJ SOLN
INTRAMUSCULAR | Status: DC | PRN
  Administered 2014-01-09: 50 ug via INTRAVENOUS
  Administered 2014-01-09: 100 ug via INTRAVENOUS
  Administered 2014-01-09: 50 ug via INTRAVENOUS

## 2014-01-09 MED ORDER — HYDROMORPHONE HCL PF 1 MG/ML IJ SOLN: 0.2500 mg | INTRAMUSCULAR | Status: DC | PRN

## 2014-01-09 MED ORDER — ONDANSETRON HCL 4 MG/2ML IJ SOLN
INTRAMUSCULAR | Status: DC | PRN
  Administered 2014-01-09: 4 mg via INTRAVENOUS

## 2014-01-09 SURGICAL SUPPLY — 46 items
BANDAGE ELASTIC 4 VELCRO ST LF (GAUZE/BANDAGES/DRESSINGS) ×3 IMPLANT
BANDAGE GAUZE ELAST BULKY 4 IN (GAUZE/BANDAGES/DRESSINGS) ×5 IMPLANT
CLOTH BEACON ORANGE TIMEOUT ST (SAFETY) ×1 IMPLANT
CUFF TOURNIQUET SINGLE 18IN (TOURNIQUET CUFF) ×3 IMPLANT
CUFF TOURNIQUET SINGLE 24IN (TOURNIQUET CUFF) IMPLANT
CUFF TOURNIQUET SINGLE 34IN LL (TOURNIQUET CUFF) ×2 IMPLANT
CUFF TOURNIQUET SINGLE 44IN (TOURNIQUET CUFF) IMPLANT
DRAPE SURG 17X23 STRL (DRAPES) ×5 IMPLANT
DRAPE U-SHAPE 47X51 STRL (DRAPES) ×3 IMPLANT
DRSG EMULSION OIL 3X3 NADH (GAUZE/BANDAGES/DRESSINGS) ×1 IMPLANT
DRSG PAD ABDOMINAL 8X10 ST (GAUZE/BANDAGES/DRESSINGS) ×5 IMPLANT
ELECT REM PT RETURN 9FT ADLT (ELECTROSURGICAL) ×3
ELECTRODE REM PT RTRN 9FT ADLT (ELECTROSURGICAL) IMPLANT
FACESHIELD LNG OPTICON STERILE (SAFETY) ×1 IMPLANT
GLOVE BIOGEL PI IND STRL 7.5 (GLOVE) ×1 IMPLANT
GLOVE BIOGEL PI IND STRL 8 (GLOVE) ×1 IMPLANT
GLOVE BIOGEL PI INDICATOR 7.5 (GLOVE)
GLOVE BIOGEL PI INDICATOR 8 (GLOVE) ×2
GLOVE ECLIPSE 8.0 STRL XLNG CF (GLOVE) ×1 IMPLANT
GLOVE ORTHO TXT STRL SZ7.5 (GLOVE) ×5 IMPLANT
GLOVE SURG ORTHO 8.0 STRL STRW (GLOVE) ×1 IMPLANT
GOWN STRL NON-REIN LRG LVL3 (GOWN DISPOSABLE) ×9 IMPLANT
GOWN STRL REIN 3XL XLG LVL4 (GOWN DISPOSABLE) ×3 IMPLANT
HANDPIECE INTERPULSE COAX TIP (DISPOSABLE)
KIT BASIN OR (CUSTOM PROCEDURE TRAY) ×3 IMPLANT
KIT ROOM TURNOVER OR (KITS) ×3 IMPLANT
MANIFOLD NEPTUNE II (INSTRUMENTS) ×3 IMPLANT
NS IRRIG 1000ML POUR BTL (IV SOLUTION) ×3 IMPLANT
PACK ORTHO EXTREMITY (CUSTOM PROCEDURE TRAY) ×3 IMPLANT
PAD ARMBOARD 7.5X6 YLW CONV (MISCELLANEOUS) ×6 IMPLANT
SET HNDPC FAN SPRY TIP SCT (DISPOSABLE) IMPLANT
SPONGE GAUZE 4X4 12PLY (GAUZE/BANDAGES/DRESSINGS) ×5 IMPLANT
SPONGE LAP 18X18 X RAY DECT (DISPOSABLE) ×5 IMPLANT
SPONGE LAP 4X18 X RAY DECT (DISPOSABLE) ×1 IMPLANT
STOCKINETTE IMPERVIOUS 9X36 MD (GAUZE/BANDAGES/DRESSINGS) ×3 IMPLANT
SUCTION FRAZIER TIP 10 FR DISP (SUCTIONS) IMPLANT
SUT ETHILON 3 0 PS 1 (SUTURE) ×3 IMPLANT
SUT SILK 2 0 FS (SUTURE) ×3 IMPLANT
TOWEL OR 17X24 6PK STRL BLUE (TOWEL DISPOSABLE) ×3 IMPLANT
TOWEL OR 17X26 10 PK STRL BLUE (TOWEL DISPOSABLE) ×3 IMPLANT
TUBE ANAEROBIC SPECIMEN COL (MISCELLANEOUS) ×4 IMPLANT
TUBE CONNECTING 12'X1/4 (SUCTIONS) ×1
TUBE CONNECTING 12X1/4 (SUCTIONS) ×2 IMPLANT
UNDERPAD 30X30 INCONTINENT (UNDERPADS AND DIAPERS) ×3 IMPLANT
WATER STERILE IRR 1000ML POUR (IV SOLUTION) ×1 IMPLANT
YANKAUER SUCT BULB TIP NO VENT (SUCTIONS) ×3 IMPLANT

## 2014-01-09 NOTE — Progress Notes (Signed)
Orthopedic Tech Progress Note Patient Details:  [name] [name] December 08, 1965 [id number]  Ortho Devices Type of Ortho Device: Darco shoe Ortho Device/Splint Interventions: Application   Shawnie PonsCammer, Francina Beery Carol 01/09/2014, 12:11 PM

## 2014-01-09 NOTE — Op Note (Signed)
01/08/2014 - 01/09/2014  11:09 AM  PATIENT:   Loleta Chanceeginald F Haas  48 y.o. 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 M.D.  ASSISTANTS: none   ANESTHESIA:   LMA general  EBL: minimal  TT <30 min  SPECIMEN:  Aerobic and anaerobic cultures  Drains: none, wound packed open   PATIENT DISPOSITION:  PACU - hemodynamically stable.    PLAN OF CARE: Admit to inpatient   Wound care consult for BID wet to dry dressing changes Will monitor wound. Significant tissue loss and zone of necrosis will likely require 1st and second toe/ray amputation  Dictation# [id number]

## 2014-01-09 NOTE — Plan of Care (Signed)
Problem: Phase I Progression Outcomes Goal: Pain controlled with appropriate interventions Outcome: Completed/Met Date Met:  01/09/14 Pt has not complained of pain this shift

## 2014-01-09 NOTE — Preoperative (Signed)
Beta Blockers   Reason not to administer Beta Blockers:Not Applicable 

## 2014-01-09 NOTE — Anesthesia Preprocedure Evaluation (Addendum)
Anesthesia Evaluation   Patient awake    Reviewed: Allergy & Precautions, H&P , NPO status , Patient's Chart, lab work & pertinent test results  History of Anesthesia Complications Negative for: history of anesthetic complications  Airway Mallampati: II TM Distance: >3 FB Neck ROM: Full    Dental  (+) Teeth Intact and Dental Advisory Given   Pulmonary neg pulmonary ROS,    Pulmonary exam normal       Cardiovascular hypertension,     Neuro/Psych negative neurological ROS  negative psych ROS   GI/Hepatic negative GI ROS, Neg liver ROS,   Endo/Other  diabetes, Oral Hypoglycemic Agents and Insulin DependentMorbid obesity  Renal/GU Renal InsufficiencyRenal disease     Musculoskeletal   Abdominal   Peds  Hematology negative hematology ROS (+)   Anesthesia Other Findings   Reproductive/Obstetrics                          Anesthesia Physical Anesthesia Plan  ASA: III and emergent  Anesthesia Plan: General   Post-op Pain Management:    Induction: Intravenous  Airway Management Planned: LMA  Additional Equipment:   Intra-op Plan:   Post-operative Plan: Extubation in OR  Informed Consent: I have reviewed the patients History and Physical, chart, labs and discussed the procedure including the risks, benefits and alternatives for the proposed anesthesia with the patient or authorized representative who has indicated his/her understanding and acceptance.   Dental advisory given  Plan Discussed with: CRNA, Anesthesiologist and Surgeon  Anesthesia Plan Comments:        Anesthesia Quick Evaluation

## 2014-01-09 NOTE — Progress Notes (Signed)
[name] ChanceReginald F [name]  MRN: [id number] DOB/Age: 01-28-1966 47 y.o. Physician: Lynnea MaizesK Tyisha Cressy, M.D. Day of Surgery Procedure(s) (LRB): IRRIGATION AND DEBRIDEMENT EXTREMITY (Right)  Subjective: C/o foot pain, swelling. Vital Signs Temp:  [98.3 F (36.8 C)-99.4 F (37.4 C)] 98.9 F (37.2 C) (02/07 0300) Pulse Rate:  [86-104] 86 (02/07 0300) Resp:  [18-24] 23 (02/07 0300) BP: (152-185)/(69-101) 155/69 mmHg (02/07 0300) SpO2:  [94 %-97 %] 94 % (02/07 0300) Weight:  [159.984 kg (352 lb 11.2 oz)] 159.984 kg (352 lb 11.2 oz) (02/06 1422)  Lab Results  Recent Labs  01/08/14 1540 01/09/14 0546  WBC 41.3* 35.3*  HGB 11.8* 10.8*  HCT 33.9* 31.5*  PLT 356 328   BMET  Recent Labs  01/08/14 1115 01/08/14 1540 01/09/14 0546  NA 131*  --  132*  K 4.1  --  3.6*  CL 89*  --  92*  CO2 20  --  19  GLUCOSE 299*  --  196*  BUN 24*  --  30*  CREATININE 1.20 1.17 1.53*  CALCIUM 9.4  --  8.6   INR  Date Value Range Status  01/08/2014 1.16  0.00 - 1.49 Final     Exam  Right foot diffusely swollen, foul smell, purulent drainage, ascending erythema to midfoot. MRI with sub Q gas, soft tissue swelling, no obvious bone involvement.  Impression: Gas gangrene right forefoot   Plan To OR for I and D. Risks/benefits/ treatment options reviewed.  [name] [name] M 01/09/2014, 9:05 AM

## 2014-01-09 NOTE — Progress Notes (Signed)
Assessed pt for IV site with Earlie LouValorie Conley, RN. Unable to see any potential sites.

## 2014-01-09 NOTE — Progress Notes (Signed)
[facility] PROGRESS NOTE  [name] [name]:[id number] DOB: May 14, 1966 DOA: 01/08/2014 PCP: [name], MD  Brief narrative: [name] [name] is a 48yo man with PMH of DM on metformin and victoza who presents with right foot drainage, swelling.  He notes that he stepped on a tack about a month ago and developed a wound.  At that time he was treated with ciprofloxacin for 2 weeks.  The wound improved and he was subsequently treated with silvadene cream for 2 more weeks, however, the wound worsened.  He was started back on Ciprofloxacin Tuesday of this week, however, he noticed an increased redness and swelling of the left foot/great toe so he presented to the hospital.   Cellulitis/Abscess - On IV vancomycin, zosyn, clindamycin - Due for OR debridement today - Swelling/redness somewhat improved - Tylenol for fever - MRI foot revealed concern for necrotizing fasciitis  - Cultures pending - Zofran, IVF.  - Continue NPO for now until post surgical.   H/O Malignant HTN - Resumed on home medications including clonidine and hydralazine - If BP starts to run high, will start a prn medication  DM2 - Hgb A1C - SSI and levemir - Holding PO medications at this time while NPO - CBGs ranging from 180s to 200s.  Acute rise in Cr, AKI?  - Unknown baseline Cr - Possibly due to medications - Check daily and if continues to rise, possibly consider stopping vancomycin and other renally cleared medications in his acute illness - Continue IVF, PO fluids when tolerated  Mild Hypokalemia - will replace orally and monitor  Mild Hyponatremia - Monitor BMET, currently 132  DVT PPx: Lovenox SQ  Consultants:  Orthopedics  Procedures/Studies: Mr Foot Right W Wo Contrast  01/09/2014   CLINICAL DATA:  Diabetic with great toe swelling, erythema and drainage. Soft tissue emphysema on radiographs.  EXAM: MRI OF THE RIGHT FOREFOOT WITHOUT AND WITH CONTRAST  TECHNIQUE: Multiplanar, multisequence MR  imaging was performed both before and after administration of intravenous contrast.  CONTRAST:  20mL MULTIHANCE GADOBENATE DIMEGLUMINE 529 MG/ML IV SOLN  COMPARISON:  DG FOOT COMPLETE*R* dated 01/08/2014  FINDINGS: There is extensive susceptibility artifact within the plantar aspect of the first web space corresponding with the extensive soft tissue emphysema on the radiographs. There is extension to the skin plantar to the first metatarsal phalangeal joint. There is also dorsal extension into the great toe and along the extensor tendon dorsal to the second metatarsal. There is soft tissue edema and heterogeneous enhancement throughout the forefoot. No focal fluid collections are identified.  There is a small effusion of the first metatarsal phalangeal joint. This joint demonstrates low level synovial enhancement following contrast. There is no evidence of intra-articular air.  The toes and visualized metatarsals demonstrate no suspicious marrow edema, enhancement or cortical destruction to suggest osteomyelitis.  IMPRESSION: 1. As demonstrated radiographically, there is extensive soft tissue emphysema within the forefoot medially, suspicious for necrotizing infection. 2. No drainable abscess identified. 3. No evidence of osteomyelitis. Mild synovial enhancement of the first metatarsal phalangeal joint is noted, potentially due to intra-articular spread of infection. However, there is only a small associated effusion.   Electronically Signed   By: Roxy Horseman M.D.   On: 01/09/2014 08:21   Dg Foot Complete Right  01/08/2014   CLINICAL DATA:  Prior history of soft tissue trauma.  EXAM: RIGHT FOOT COMPLETE - 3+ VIEW  COMPARISON:  None.  FINDINGS: Areas of subcutaneous emphysema project within the soft tissues between the  distal aspect of the first and second digit as well as adjacent to the base of the distal phalanx and along the proximal phalanx of the first digit. No definite cortical destruction is appreciated.  Considering the patient's history and extensive emphysematous changes surgical consultation, immediate is recommended. A necrotizing organism within the soft tissues cannot be excluded. There is otherwise no evidence of fracture nor dislocation.  IMPRESSION: Areas of subcutaneous emphysema within the soft tissues adjacent to the first and second digits as well as along the medial aspect of the first digit. Considering the patient's history and the radiographic findings immediate surgical consultation is recommended in etiologies such is a necrotizing organism cannot be excluded. There does not appear to be definite cortical destruction of the bone though if clinically warranted further evaluation with contrasted MRI is recommended These results were called by telephone at the time of interpretation on 01/08/2014 at 11:47 AM to Dr. Gwyneth Sprout , who verbally acknowledged these results.   Electronically Signed   By: Salome Holmes M.D.   On: 01/08/2014 11:48     Antibiotics:  Clindamycin --> 01/08/14  Vancomycin --> 01/08/14  Zosyn --> 01/08/14  Code Status: Full Family Communication: Pt at bedside Disposition Plan: Home when medically stable  HPI/Subjective: No events overnight.  Pain in leg seems to have decreased somewhat.   Objective: Filed Vitals:   01/08/14 1553 01/08/14 1731 01/08/14 2100 01/09/14 0300  BP: 180/70 158/70 152/83 155/69  Pulse:   98 86  Temp:   98.5 F (36.9 C) 98.9 F (37.2 C)  TempSrc:   Oral Oral  Resp:   24 23  Height:      Weight:      SpO2:   94% 94%    Intake/Output Summary (Last 24 hours) at 01/09/14 0900 Last data filed at 01/09/14 6045  Gross per 24 hour  Intake 2016.67 ml  Output    700 ml  Net 1316.67 ml    Exam:   General:  Pt is alert, follows commands appropriately, not in acute distress  Cardiovascular: Regular rate and rhythm, S1/S2  Respiratory: Clear to auscultation bilaterally, no wheezing, no crackles  Abdomen: Soft, non tender,  non distended, bowel sounds present, no guarding  Extremities: + non pitting edema to right foot to ankle, + erythema to ankle and warmth to mid calf on the right.  First and second toe swollen with cut in the web space between with clear/yellow drainage.  + TTP.  No pain with movement of joints.  Patient able to move toes without issue.   Neuro: Grossly nonfocal  Data Reviewed: Basic Metabolic Panel:  Recent Labs Lab 01/08/14 1115 01/08/14 1540 01/09/14 0546  NA 131*  --  132*  K 4.1  --  3.6*  CL 89*  --  92*  CO2 20  --  19  GLUCOSE 299*  --  196*  BUN 24*  --  30*  CREATININE 1.20 1.17 1.53*  CALCIUM 9.4  --  8.6   Liver Function Tests:  Recent Labs Lab 01/08/14 1115 01/09/14 0546  AST 11 12  ALT 12 11  ALKPHOS 123* 103  BILITOT 0.6 0.5  PROT 8.3 7.1  ALBUMIN 2.6* 1.9*   CBC:  Recent Labs Lab 01/08/14 1115 01/08/14 1540 01/09/14 0546  WBC 38.2* 41.3* 35.3*  NEUTROABS 33.3*  --   --   HGB 12.1* 11.8* 10.8*  HCT 36.3* 33.9* 31.5*  MCV 79.1 78.5 78.4  PLT 344 356 328  CBG:  Recent Labs Lab 01/08/14 1045 01/08/14 1420 01/08/14 1709 01/08/14 2222 01/09/14 0806  GLUCAP 277* 220* 193* 206* 185*      Scheduled Meds: . sodium chloride   Intravenous STAT  . antiseptic oral rinse  15 mL Mouth Rinse q12n4p  . chlorhexidine  15 mL Mouth Rinse BID  . clindamycin (CLEOCIN) IV  600 mg Intravenous Q8H  . cloNIDine  0.2 mg Oral Daily  . heparin  5,000 Units Subcutaneous Q8H  . hydrALAZINE  25 mg Oral BID  . insulin aspart  0-15 Units Subcutaneous TID WC  . insulin aspart  0-5 Units Subcutaneous QHS  . insulin aspart  4 Units Subcutaneous TID WC  . insulin detemir  10 Units Subcutaneous QHS  . nisoldipine  17 mg Oral Daily  . piperacillin-tazobactam (ZOSYN)  IV  3.375 g Intravenous Q8H  . vancomycin  1,500 mg Intravenous Q12H   Continuous Infusions: . sodium chloride 125 mL/hr at 01/08/14 1553     [name] [name], [name] Felmlee, MD  [facility] Pager [phone]  If  7PM-7AM, please contact night-coverage [email or web address] Password [email or web address] 01/09/2014, 9:00 AM   LOS: 1 day

## 2014-01-09 NOTE — Anesthesia Postprocedure Evaluation (Signed)
Anesthesia Post Note  Patient: Loleta ChanceReginald F Footman  Procedure(s) Performed: Procedure(s) (LRB): IRRIGATION AND DEBRIDEMENT EXTREMITY (Right)  Anesthesia type: general  Patient location: PACU  Post pain: Pain level controlled  Post assessment: Patient's Cardiovascular Status Stable  Last Vitals:  Filed Vitals:   01/09/14 1150  BP:   Pulse: 72  Temp: 36.7 C  Resp: 18    Post vital signs: Reviewed and stable  Level of consciousness: sedated  Complications: No apparent anesthesia complications

## 2014-01-09 NOTE — Transfer of Care (Signed)
Immediate Anesthesia Transfer of Care Note  Patient: [name] [name]  Procedure(s) Performed: Procedure(s): IRRIGATION AND DEBRIDEMENT EXTREMITY (Right)  Patient Location: PACU  Anesthesia Type:General  Level of Consciousness: patient cooperative, lethargic and responds to stimulation  Airway & Oxygen Therapy: Patient Spontanous Breathing and Patient connected to face mask oxygen  Post-op Assessment: Report given to PACU RN and Post -op Vital signs reviewed and stable  Post vital signs: Reviewed and stable  Complications: No apparent anesthesia complications

## 2014-01-10 LAB — CBC
HCT: 31.2 % — ABNORMAL LOW (ref 39.0–52.0)
Hemoglobin: 10.6 g/dL — ABNORMAL LOW (ref 13.0–17.0)
MCH: 26.8 pg (ref 26.0–34.0)
MCHC: 34 g/dL (ref 30.0–36.0)
MCV: 79 fL (ref 78.0–100.0)
PLATELETS: 355 10*3/uL (ref 150–400)
RBC: 3.95 MIL/uL — AB (ref 4.22–5.81)
RDW: 14.6 % (ref 11.5–15.5)
WBC: 33.6 10*3/uL — ABNORMAL HIGH (ref 4.0–10.5)

## 2014-01-10 LAB — BASIC METABOLIC PANEL
BUN: 32 mg/dL — AB (ref 6–23)
CALCIUM: 8.4 mg/dL (ref 8.4–10.5)
CO2: 20 mEq/L (ref 19–32)
Chloride: 97 mEq/L (ref 96–112)
Creatinine, Ser: 1.39 mg/dL — ABNORMAL HIGH (ref 0.50–1.35)
GFR calc Af Amer: 69 mL/min — ABNORMAL LOW (ref >90)
GFR, EST NON AFRICAN AMERICAN: 60 mL/min — AB (ref >90)
GLUCOSE: 222 mg/dL — AB (ref 70–99)
Potassium: 4 mEq/L (ref 3.7–5.3)
SODIUM: 135 meq/L — AB (ref 137–147)

## 2014-01-10 LAB — GLUCOSE, CAPILLARY
Glucose-Capillary: 220 mg/dL — ABNORMAL HIGH (ref 70–99)
Glucose-Capillary: 249 mg/dL — ABNORMAL HIGH (ref 70–99)
Glucose-Capillary: 250 mg/dL — ABNORMAL HIGH (ref 70–99)
Glucose-Capillary: 230 mg/dL — ABNORMAL HIGH (ref 70–99)

## 2014-01-10 NOTE — Progress Notes (Signed)
Pt's IV found to be alarming without sound on both modules. IV Zosyn hanging. Next dose of Zosyn scheduled for 2200. Called pharmacy and spoke with pharmacist who instructed RN to discard Zosyn that was hanging (with at least half dose still in bag), and hang new bag after new IV started. Will continue to monitor.

## 2014-01-10 NOTE — Progress Notes (Signed)
PROGRESS NOTE  [name] [name] ZOX:[id number] DOB: Jan 13, 1966 DOA: 01/08/2014 PCP: [name], MD  Brief narrative: [name] [name] is a 48yo man with PMH of DM on metformin and victoza who presents with right foot drainage, swelling.  He notes that he stepped on a tack about a month ago and developed a wound.  At that time he was treated with ciprofloxacin for 2 weeks.  The wound improved and he was subsequently treated with silvadene cream for 2 more weeks, however, the wound worsened.  He was started back on Ciprofloxacin Tuesday of this week, however, he noticed an increased redness and swelling of the left foot/great toe so he presented to the hospital.   Cellulitis/Abscess - On IV vancomycin, zosyn, clindamycin - s/p debridement in OR 2/7 - Swelling/redness somewhat improved - Tylenol for fever - MRI foot revealed concern for necrotizing fasciitis  - Cultures pending - Zofran, IVF.  -pain controlled  H/O Malignant HTN - Resumed on home medications including clonidine and hydralazine   DM2 - Hgb A1C: 12.5 on admission - SSI and levemir - CBGs ranging from 180s to 200s.  Acute rise in Cr, AKI?  - Unknown baseline Cr - Possibly due to medications - Check daily and if continues to rise, possibly consider stopping vancomycin and other renally cleared medications in his acute illness - Continue IVF, PO fluids when tolerated  Mild Hypokalemia - repleted  Mild Hyponatremia - resolved  DVT PPx: Lovenox SQ  Consultants:  Orthopedics  Procedures/Studies: Mr Foot Right W Wo Contrast  01/09/2014   CLINICAL DATA:  Diabetic with great toe swelling, erythema and drainage. Soft tissue emphysema on radiographs.  EXAM: MRI OF THE RIGHT FOREFOOT WITHOUT AND WITH CONTRAST  TECHNIQUE: Multiplanar, multisequence MR imaging was performed both before and after administration of intravenous contrast.  CONTRAST:  20mL MULTIHANCE GADOBENATE DIMEGLUMINE 529 MG/ML IV SOLN  COMPARISON:  DG FOOT  COMPLETE*R* dated 01/08/2014  FINDINGS: There is extensive susceptibility artifact within the plantar aspect of the first web space corresponding with the extensive soft tissue emphysema on the radiographs. There is extension to the skin plantar to the first metatarsal phalangeal joint. There is also dorsal extension into the great toe and along the extensor tendon dorsal to the second metatarsal. There is soft tissue edema and heterogeneous enhancement throughout the forefoot. No focal fluid collections are identified.  There is a small effusion of the first metatarsal phalangeal joint. This joint demonstrates low level synovial enhancement following contrast. There is no evidence of intra-articular air.  The toes and visualized metatarsals demonstrate no suspicious marrow edema, enhancement or cortical destruction to suggest osteomyelitis.  IMPRESSION: 1. As demonstrated radiographically, there is extensive soft tissue emphysema within the forefoot medially, suspicious for necrotizing infection. 2. No drainable abscess identified. 3. No evidence of osteomyelitis. Mild synovial enhancement of the first metatarsal phalangeal joint is noted, potentially due to intra-articular spread of infection. However, there is only a small associated effusion.   Electronically Signed   By: Roxy Horseman M.D.   On: 01/09/2014 08:21   Dg Foot Complete Right  01/08/2014   CLINICAL DATA:  Prior history of soft tissue trauma.  EXAM: RIGHT FOOT COMPLETE - 3+ VIEW  COMPARISON:  None.  FINDINGS: Areas of subcutaneous emphysema project within the soft tissues between the distal aspect of the first and second digit as well as adjacent to the base of the distal phalanx and along the proximal phalanx of the first digit. No definite cortical destruction  is appreciated. Considering the patient's history and extensive emphysematous changes surgical consultation, immediate is recommended. A necrotizing organism within the soft tissues cannot be  excluded. There is otherwise no evidence of fracture nor dislocation.  IMPRESSION: Areas of subcutaneous emphysema within the soft tissues adjacent to the first and second digits as well as along the medial aspect of the first digit. Considering the patient's history and the radiographic findings immediate surgical consultation is recommended in etiologies such is a necrotizing organism cannot be excluded. There does not appear to be definite cortical destruction of the bone though if clinically warranted further evaluation with contrasted MRI is recommended These results were called by telephone at the time of interpretation on 01/08/2014 at 11:47 AM to Dr. Gwyneth Sprout , who verbally acknowledged these results.   Electronically Signed   By: Salome Holmes M.D.   On: 01/08/2014 11:48    Antibiotics:  Clindamycin --> 01/08/14  Vancomycin --> 01/08/14  Zosyn --> 01/08/14  Code Status: Full Family Communication: Pt at bedside Disposition Plan: Home when medically stable  HPI/Subjective: S/p surgery Pain controlled thirsty   Objective: Filed Vitals:   01/09/14 1848 01/09/14 2113 01/10/14 0224 01/10/14 0647  BP: 163/95 167/78 158/82 144/86  Pulse: 94 94 83 85  Temp: 98.5 F (36.9 C) 98.7 F (37.1 C) 99 F (37.2 C) 98.9 F (37.2 C)  TempSrc: Oral Oral Oral Oral  Resp: 18 17 17 17   Height:      Weight:      SpO2: 91% 90% 90% 93%    Intake/Output Summary (Last 24 hours) at 01/10/14 0937 Last data filed at 01/10/14 0649  Gross per 24 hour  Intake    500 ml  Output   1400 ml  Net   -900 ml    Exam:   General:  Pt is alert, follows commands appropriately, not in acute distress  Cardiovascular: Regular rate and rhythm, S1/S2  Respiratory: Clear to auscultation bilaterally, no wheezing, no crackles  Abdomen: Soft, non tender, non distended, bowel sounds present, no guarding  Extremities: right foot wrapped in gauze/brace   Neuro: Grossly nonfocal  Data Reviewed: Basic  Metabolic Panel:  Recent Labs Lab 01/08/14 1115 01/08/14 1540 01/09/14 0546 01/10/14 0700  NA 131*  --  132* 135*  K 4.1  --  3.6* 4.0  CL 89*  --  92* 97  CO2 20  --  19 20  GLUCOSE 299*  --  196* 222*  BUN 24*  --  30* 32*  CREATININE 1.20 1.17 1.53* 1.39*  CALCIUM 9.4  --  8.6 8.4   Liver Function Tests:  Recent Labs Lab 01/08/14 1115 01/09/14 0546  AST 11 12  ALT 12 11  ALKPHOS 123* 103  BILITOT 0.6 0.5  PROT 8.3 7.1  ALBUMIN 2.6* 1.9*   CBC:  Recent Labs Lab 01/08/14 1115 01/08/14 1540 01/09/14 0546 01/10/14 0700  WBC 38.2* 41.3* 35.3* 33.6*  NEUTROABS 33.3*  --   --   --   HGB 12.1* 11.8* 10.8* 10.6*  HCT 36.3* 33.9* 31.5* 31.2*  MCV 79.1 78.5 78.4 79.0  PLT 344 356 328 355   CBG:  Recent Labs Lab 01/09/14 1113 01/09/14 1757 01/09/14 2110 01/09/14 2322 01/10/14 0758  GLUCAP 183* 251* 211* 204* 220*      Scheduled Meds: . antiseptic oral rinse  15 mL Mouth Rinse q12n4p  . chlorhexidine  15 mL Mouth Rinse BID  . clindamycin (CLEOCIN) IV  600 mg Intravenous Q8H  .  cloNIDine  0.2 mg Oral Daily  . docusate sodium  100 mg Oral BID  . enoxaparin (LOVENOX) injection  40 mg Subcutaneous Q24H  . hydrALAZINE  25 mg Oral BID  . insulin aspart  0-15 Units Subcutaneous TID WC  . insulin aspart  0-5 Units Subcutaneous QHS  . insulin aspart  4 Units Subcutaneous TID WC  . insulin detemir  10 Units Subcutaneous QHS  . nisoldipine  17 mg Oral Daily  . piperacillin-tazobactam (ZOSYN)  IV  3.375 g Intravenous Q8H  . vancomycin  1,500 mg Intravenous Q12H   Continuous Infusions:     VANN, JESSICA, DO  [facility] Pager (98[address] [email or web address]) [phone]  If 7PM-7AM, please contact night-coverage [email or web address] Password [email or web address] 01/10/2014, 9:37 AM   LOS: 2 days

## 2014-01-10 NOTE — Progress Notes (Signed)
RN misread the CBG value to be 251 instead of 211 and gave 3 units of insulin instead of 2 units. Patient and MD notified. CBG was rechecked and found to be 204. Patient did not show or complain of any signs of distress. Will continue to monitor.

## 2014-01-10 NOTE — Progress Notes (Signed)
Subjective: 1 Day Post-Op Procedure(s) (LRB): IRRIGATION AND DEBRIDEMENT EXTREMITY (Right) Patient reports pain as 2 on 0-10 scale.  Dressing intact with some bloody drainage.WBC 33,600 but less than Pre-op. Afebrile  Objective: Vital signs in last 24 hours: Temp:  [97.4 F (36.3 C)-99 F (37.2 C)] 98.9 F (37.2 C) (02/08 0647) Pulse Rate:  [72-94] 85 (02/08 0647) Resp:  [17-22] 17 (02/08 0647) BP: (132-167)/(69-95) 144/86 mmHg (02/08 0647) SpO2:  [89 %-93 %] 93 % (02/08 0647) FiO2 (%):  [32 %] 32 % (02/07 1217)  Intake/Output from previous day: 02/07 0701 - 02/08 0700 In: 500 [I.V.:500] Out: 1700 [Urine:1700] Intake/Output this shift:     Recent Labs  01/08/14 1115 01/08/14 1540 01/09/14 0546 01/10/14 0700  HGB 12.1* 11.8* 10.8* 10.6*    Recent Labs  01/09/14 0546 01/10/14 0700  WBC 35.3* 33.6*  RBC 4.02* 3.95*  HCT 31.5* 31.2*  PLT 328 355    Recent Labs  01/09/14 0546 01/10/14 0700  NA 132* 135*  K 3.6* 4.0  CL 92* 97  CO2 19 20  BUN 30* 32*  CREATININE 1.53* 1.39*  GLUCOSE 196* 222*  CALCIUM 8.6 8.4    Recent Labs  01/08/14 1540  INR 1.16    Dressinf intact.  Assessment/Plan: 1 Day Post-Op Procedure(s) (LRB): IRRIGATION AND DEBRIDEMENT EXTREMITY (Right)   Kamie Korber A 01/10/2014, 9:26 AM

## 2014-01-10 NOTE — Op Note (Signed)
NAMAris Lot:  [name], [name]              ACCOUNT NO.:  [id number]  MEDICAL RECORD NO.:  [id number]  LOCATION:  [id number]                        FACILITY:  [facility]  PHYSICIAN:  Vania ReaKevin M. Geniene [name], M.D.  DATE OF BIRTH:  Jul 08, 1966  DATE OF PROCEDURE:  01/09/2014 DATE OF DISCHARGE:                              OPERATIVE REPORT   PREOPERATIVE DIAGNOSIS:  Right forefoot gas gangrene.  POSTOPERATIVE DIAGNOSIS:  Right forefoot gas gangrene with the additional finding of a first webspace abscess.  PROCEDURE:  Exploration, incision, debridement and irrigation of right forefoot first webspace abscess.  SURGEON:  Vania ReaKevin M. [name] [name], M.D.  ASSISTANT:  None.  ANESTHESIA:  LMA, general.  TOURNIQUET TIME:  Less than 30 minutes.  Exact time is available from the anesthesia record.  ESTIMATED BLOOD LOSS:  Minimal.  DRAINS:  None.  The wound was packed open.  INTRAOPERATIVE SPECIMENS:  Aerobic and anaerobic cultures were taken of the abscess.  HISTORY:  [name] is a 48 year old gentleman, diabetic, presents with progressive increasing right foot pain, swelling, drainage and foul odor.  Admitted to the Medicine Service and completed a MRI scan, which was concerning for gas gangrene with necrotic tissue, but no obvious drainable fluid collection.  His examination shows a diffusely swollen right foot particularly about the forefoot with an area dorsally of superficial skin slough and a plantar eschar at the first metatarsal head and some foul-smelling purulent drainage emanating from the plantar ulcer and also some ascending erythema.  The patient reported minimal pain; however, plain radiograph showed no obvious bony involvement and the MRI scan confirmed the soft tissue involvement, but no obvious bony destruction.  Due to the evidence clinically and diagnostically for gas gangrene with associated soft tissue infection/abscess, the patient was brought urgently to the operating room at this morning for  planned exploration, irrigation and debridement.  Preoperatively, I counseled [name] regarding treatment options and risks versus benefits thereof.  Possible surgical complications were reviewed including potential for bleeding, infection, neurovascular injury, and likely need for additional surgery including potential for amputation.  He understands and accepts, and agrees with our plan.  PROCEDURE IN DETAIL:  After undergoing routine preop evaluation, the patient was maintained on his current dosing of antibiotics.  Brought to the operating room and placed supine on the operating table, underwent smooth induction of an LMA general anesthesia.  Turned by the right thigh, right leg was sterilely prepped and draped in standard fashion. Time-out was called.  The tourniquet was inflated to 350 mmHg.  I made an initial dorsal incision along the first webspace to the midmetatarsal level and this did indeed to gain access to area of frankly necrotic tissue with extremely foul smell and then this was confluent with the ulceration plantarly.  I did obtain cultures at this point both aerobic and anaerobic.  I then went ahead and extended the incision plantarly eclipsing out the plantar ulcer and found large areas of necrotic tissue, which were sharply divided and excised.  Ultimately, entire webspace was opened, and both of the soft tissues were removed since they were all frankly necrotic.  Dissected to the margins of the abscess/necrotic zone and completely released these areas debulking the  obviously necrotic nonviable tissues.  At this point, then the tourniquet was let down and confirmed that the majority of the cut edges did have bleeding surfaces.  There were some areas distally in the webspace about the first and second MTP joints where definitive vascularity was not obvious, but strongly, we had removed all the grossly devitalized necrotic tissues.  The wound was then  copiously irrigated.  I placed a wet packing into the dorsal and plantar aspects of the wound and then bulky, dry dressings were wrapped about the forefoot followed by Kerlix and Ace bandage.  At this point, the patient was then awakened, extubated, and taken to the recovery room in stable condition.     Vania Rea. [name] [name], M.D.     KMS/MEDQ  D:  01/09/2014  T:  01/10/2014  Job:  [id number]

## 2014-01-11 ENCOUNTER — Encounter (HOSPITAL_COMMUNITY): Payer: Self-pay | Admitting: Orthopedic Surgery

## 2014-01-11 DIAGNOSIS — Z794 Long term (current) use of insulin: Secondary | ICD-10-CM

## 2014-01-11 DIAGNOSIS — IMO0002 Reserved for concepts with insufficient information to code with codable children: Secondary | ICD-10-CM | POA: Diagnosis present

## 2014-01-11 DIAGNOSIS — E1165 Type 2 diabetes mellitus with hyperglycemia: Secondary | ICD-10-CM | POA: Diagnosis present

## 2014-01-11 DIAGNOSIS — A480 Gas gangrene: Secondary | ICD-10-CM | POA: Diagnosis present

## 2014-01-11 LAB — BASIC METABOLIC PANEL
BUN: 23 mg/dL (ref 6–23)
CALCIUM: 8.6 mg/dL (ref 8.4–10.5)
CO2: 22 meq/L (ref 19–32)
CREATININE: 1.13 mg/dL (ref 0.50–1.35)
Chloride: 98 mEq/L (ref 96–112)
GFR calc Af Amer: 89 mL/min — ABNORMAL LOW (ref >90)
GFR calc non Af Amer: 77 mL/min — ABNORMAL LOW (ref >90)
GLUCOSE: 251 mg/dL — AB (ref 70–99)
Potassium: 3.7 mEq/L (ref 3.7–5.3)
Sodium: 135 mEq/L — ABNORMAL LOW (ref 137–147)

## 2014-01-11 LAB — GLUCOSE, CAPILLARY
GLUCOSE-CAPILLARY: 215 mg/dL — AB (ref 70–99)
Glucose-Capillary: 204 mg/dL — ABNORMAL HIGH (ref 70–99)
Glucose-Capillary: 173 mg/dL — ABNORMAL HIGH (ref 70–99)
Glucose-Capillary: 180 mg/dL — ABNORMAL HIGH (ref 70–99)

## 2014-01-11 LAB — CBC
HCT: 32.3 % — ABNORMAL LOW (ref 39.0–52.0)
HEMOGLOBIN: 11 g/dL — AB (ref 13.0–17.0)
MCH: 26.9 pg (ref 26.0–34.0)
MCHC: 34.1 g/dL (ref 30.0–36.0)
MCV: 79 fL (ref 78.0–100.0)
Platelets: 390 10*3/uL (ref 150–400)
RBC: 4.09 MIL/uL — AB (ref 4.22–5.81)
RDW: 14.7 % (ref 11.5–15.5)
WBC: 28.1 10*3/uL — ABNORMAL HIGH (ref 4.0–10.5)

## 2014-01-11 LAB — WOUND CULTURE: Culture: NO GROWTH

## 2014-01-11 MED ORDER — CLONIDINE HCL 0.1 MG PO TABS
0.1000 mg | ORAL_TABLET | Freq: Every day | ORAL | Status: DC
  Administered 2014-01-12: 0.1 mg via ORAL
  Filled 2014-01-11 (×2): qty 1

## 2014-01-11 MED ORDER — INSULIN ASPART 100 UNIT/ML ~~LOC~~ SOLN: 6.0000 [IU] | Freq: Three times a day (TID) | SUBCUTANEOUS | Status: DC

## 2014-01-11 MED ORDER — INSULIN PEN STARTER KIT
1.0000 | Freq: Once | Status: AC
  Administered 2014-01-11: 1
  Filled 2014-01-11: qty 1

## 2014-01-11 MED ORDER — INSULIN DETEMIR 100 UNIT/ML ~~LOC~~ SOLN
20.0000 [IU] | Freq: Every day | SUBCUTANEOUS | Status: DC
  Administered 2014-01-11 – 2014-01-13 (×3): 20 [IU] via SUBCUTANEOUS
  Filled 2014-01-11 (×4): qty 0.2

## 2014-01-11 MED ORDER — LISINOPRIL 10 MG PO TABS
10.0000 mg | ORAL_TABLET | Freq: Every day | ORAL | Status: DC
  Administered 2014-01-11 – 2014-01-12 (×2): 10 mg via ORAL
  Filled 2014-01-11 (×2): qty 1

## 2014-01-11 MED ORDER — INSULIN ASPART 100 UNIT/ML ~~LOC~~ SOLN
6.0000 [IU] | Freq: Three times a day (TID) | SUBCUTANEOUS | Status: DC
  Administered 2014-01-11 – 2014-01-13 (×7): 6 [IU] via SUBCUTANEOUS

## 2014-01-11 MED ORDER — METOPROLOL TARTRATE 12.5 MG HALF TABLET
12.5000 mg | ORAL_TABLET | Freq: Two times a day (BID) | ORAL | Status: DC
  Administered 2014-01-11 – 2014-01-12 (×4): 12.5 mg via ORAL
  Filled 2014-01-11 (×6): qty 1

## 2014-01-11 MED ORDER — INSULIN DETEMIR 100 UNIT/ML ~~LOC~~ SOLN
14.0000 [IU] | Freq: Every day | SUBCUTANEOUS | Status: DC
  Filled 2014-01-11: qty 0.14

## 2014-01-11 NOTE — Progress Notes (Signed)
[facility] PROGRESS NOTE  [name] [name] TOI:[id number] DOB: 06-24-1966 DOA: 01/08/2014 PCP: [name] Frees, MD  HPI/Subjective: [name] [name] is a 48yo man with PMH of DM2 and HTN who presents with right foot drainage, swelling. MRI showed sub-cutaneous gas but no indication of osteomyelitis. He is s/p debridement and irrigation of the right foot; done on 2/7. Currently patient has mild pain in his foot with weight bearing but no other complaints.   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 Onnie [name]    H/O Malignant HTN  -BP still elevated at 174/94 -Continue home regimen of clonidine and hydralazine.  Taper clonidine down to 0.1 mg -Added metoprolol 12.5 mg bid and Lisinopril 10 mg Q day on 2/9.  DM2  -Hgb A1C: 12.5 on admission; on Metformin and Invokana at home -Continue SSI and levemir  -RN insulin education ordered.  He will be discharged on insulin. -DM coordinator has been consulted   Acute rise in Cr, AKI  -on 2/7, Cr of 1.53 secondary to acute infection. -Currently Cr of 1.10  -Will monitor with BMET in am   Mild Hypokalemia  -Repleted -Will monitor with BMET in am   Mild Hyponatremia  -Resolving with IVF -Will monitor with BMET in am   DVT Prophylaxis:  SCDs and Lovenox  Code Status: Full  Family Communication: Wife at the bedside this morning  Disposition Plan: Remain inpatient   Consultants:  Orthopaedics   Procedures:  Irrigation and debridement extremity right  Antibiotics:  Clindamycin; day 4   Zosyn; day 4  Vancomycin; day  3  Objective: Filed Vitals:   01/11/14 0634 01/11/14 0737 01/11/14 1035 01/11/14 1314  BP: 163/87 160/82 175/97 174/92  Pulse: 83 80 84 80  Temp:    98.9 F (37.2 C)  TempSrc:    Oral  Resp:  18  18  Height:      Weight:      SpO2:  97%  96%    Intake/Output Summary (Last 24 hours) at 01/11/14 1536 Last data filed at 01/11/14 1520  Gross per 24 hour  Intake   1520 ml  Output   1500 ml  Net     20 ml   Filed Weights   01/08/14 1422  Weight: 159.984 kg (352 lb 11.2 oz)   Exam: General: Obese, NAD, appears stated age  Cardiovascular: RRR, S1 S2 auscultated, no rubs, murmurs or gallops.   Respiratory: Clear to auscultation bilaterally with equal chest rise  Abdomen: Soft, nontender, nondistended, + bowel sounds  Extremities: L foot in bandage; lower L leg warm and tender to palpation with mild erythema  Neuro: AAOx3 Psych: Normal affect and demeanor with intact judgement and insight  Data Reviewed: Basic Metabolic Panel:  Recent Labs Lab 01/08/14 1115 01/08/14 1540 01/09/14 0546 01/10/14 0700 01/11/14 0945  NA 131*  --  132* 135* 135*  K 4.1  --  3.6* 4.0 3.7  CL 89*  --  92* 97 98  CO2 20  --  19 20 22   GLUCOSE 299*  --  196* 222* 251*  BUN 24*  --  30* 32* 23  CREATININE 1.20 1.17 1.53* 1.39* 1.13  CALCIUM 9.4  --  8.6 8.4 8.6  Liver Function Tests:  Recent Labs Lab 01/08/14 1115 01/09/14 0546  AST 11 12  ALT 12 11  ALKPHOS 123* 103  BILITOT 0.6 0.5  PROT 8.3 7.1  ALBUMIN 2.6* 1.9*  CBC:  Recent Labs Lab 01/08/14 1115 01/08/14 1540 01/09/14 0546 01/10/14 0700 01/11/14 0945  WBC 38.2* 41.3* 35.3* 33.6* 28.1*  NEUTROABS 33.3*  --   --   --   --   HGB 12.1* 11.8* 10.8* 10.6* 11.0*  HCT 36.3* 33.9* 31.5* 31.2* 32.3*  MCV 79.1 78.5 78.4 79.0 79.0  PLT 344 356 328 355 390  CBG:  Recent Labs Lab 01/10/14 1147 01/10/14 1703 01/10/14 2144 01/11/14 0801 01/11/14 1237  GLUCAP 249* 250* 230* 204* 173*    Recent Results (from the past 240  hour(s))  CULTURE, BLOOD (ROUTINE X 2)     Status: None   Collection Time    01/08/14 12:20 PM      Result Value Range Status   Specimen Description BLOOD LEFT FOREARM   Final   Special Requests BOTTLES DRAWN AEROBIC ONLY 5CC EACH   Final   Culture  Setup Time     Final   Value: 01/08/2014 14:10     Performed at [facility]   Culture     Final   Value:        BLOOD CULTURE RECEIVED NO GROWTH TO DATE CULTURE WILL BE HELD FOR 5 DAYS BEFORE ISSUING A FINAL NEGATIVE REPORT     Performed at [facility]   Report Status PENDING   Incomplete  MRSA PCR SCREENING     Status: None   Collection Time    01/09/14  7:42 AM      Result Value Range Status   MRSA by PCR NEGATIVE  NEGATIVE Final   Comment:            The GeneXpert MRSA Assay (FDA     approved for NASAL specimens     only), is one component of a     comprehensive MRSA colonization     surveillance program. It is not     intended to diagnose MRSA     infection nor to guide or     monitor treatment for     MRSA infections.  WOUND CULTURE     Status: None   Collection Time    01/09/14 10:35 AM      Result Value Range Status   Specimen Description WOUND LEFT FOOT   Final   Special Requests NONE   Final   Gram Stain     Final   Value: RARE WBC PRESENT, PREDOMINANTLY PMN     NO SQUAMOUS EPITHELIAL CELLS SEEN     MODERATE GRAM POSITIVE COCCI IN PAIRS     Performed at [facility]   Culture     Final   Value: NO GROWTH 2 DAYS     Performed at [facility]   Report Status 01/11/2014 FINAL   Final  ANAEROBIC CULTURE     Status: None   Collection Time    01/09/14 10:35 AM      Result Value Range Status   Specimen Description FOOT LEFT   Final   Special Requests NONE   Final   Gram Stain     Final   Value: NO WBC SEEN     NO SQUAMOUS EPITHELIAL CELLS SEEN  ABUNDANT GRAM POSITIVE COCCI IN PAIRS     MODERATE GRAM VARIABLE ROD     Performed at [facility]   Culture     Final    Value: NO ANAEROBES ISOLATED; CULTURE IN PROGRESS FOR 5 DAYS     Performed at [facility]   Report Status PENDING   Incomplete    Studies: No results found.  Scheduled Meds: . antiseptic oral rinse  15 mL Mouth Rinse q12n4p  . chlorhexidine  15 mL Mouth Rinse BID  . clindamycin (CLEOCIN) IV  600 mg Intravenous Q8H  . [START ON 01/12/2014] cloNIDine  0.1 mg Oral Daily  . docusate sodium  100 mg Oral BID  . enoxaparin (LOVENOX) injection  40 mg Subcutaneous Q24H  . hydrALAZINE  25 mg Oral BID  . insulin aspart  0-15 Units Subcutaneous TID WC  . insulin aspart  0-5 Units Subcutaneous QHS  . insulin aspart  6 Units Subcutaneous TID WC  . insulin detemir  14 Units Subcutaneous QHS  . Insulin Pen Starter Kit  1 kit Other Once  . lisinopril  10 mg Oral Daily  . metoprolol tartrate  12.5 mg Oral BID  . piperacillin-tazobactam (ZOSYN)  IV  3.375 g Intravenous Q8H  . vancomycin  1,500 mg Intravenous Q12H   Active Problems:   Cellulitis and abscess   Malignant hypertension   Diabetes mellitus, type 2   Sepsis  Lang Snow, PA-S Imogene Burn, PA-C [facility] Pager [phone]. If 7PM-7AM, please contact night-coverage at [email or web address], password [email or web address] 01/11/2014, 3:36 PM  LOS: 3 days

## 2014-01-11 NOTE — Progress Notes (Signed)
[name] [name]  MRN: [id number] DOB/Age: 46/12/1965 46 y.o. Physician: [name] [name], [name].D. 2 Days Post-Op Procedure(s) (LRB): IRRIGATION AND DEBRIDEMENT EXTREMITY (Right)  Subjective: Denies foot pain Vital Signs Temp:  [97.9 F (36.6 C)-98.5 F (36.9 C)] 98.2 F (36.8 C) (02/09 0551) Pulse Rate:  [80-87] 84 (02/09 1035) Resp:  [18] 18 (02/09 0737) BP: (141-175)/(80-97) 175/97 mmHg (02/09 1035) SpO2:  [90 %-97 %] 97 % (02/09 0737)  Lab Results  Recent Labs  01/10/14 0700 01/11/14 0945  WBC 33.6* 28.1*  HGB 10.6* 11.0*  HCT 31.2* 32.3*  PLT 355 390   BMET  Recent Labs  01/10/14 0700 01/11/14 0945  NA 135* 135*  K 4.0 3.7  CL 97 98  CO2 20 22  GLUCOSE 222* 251*  BUN 32* 23  CREATININE 1.39* 1.13  CALCIUM 8.4 8.6   INR  Date Value Range Status  01/08/2014 1.16  0.00 - 1.49 Final     Exam  Dressings unchanged since surgery. Removed now showing dorsal skin dysvascularity, necrotic tissue in depth of webspace, reports sensation in tip of great toe. All /cx NGSF  Plan Will require further debridement, will allow tissue viability to further declare. [name] [name] 01/11/2014, 12:57 PM

## 2014-01-11 NOTE — Progress Notes (Addendum)
Inpatient Diabetes Program Recommendations  AACE/ADA: New Consensus Statement on Inpatient Glycemic Control (2013)  Target Ranges:  Prepandial:   less than 140 mg/dL      Peak postprandial:   less than 180 mg/dL (1-2 hours)      Critically ill patients:  140 - 180 mg/dL   Admit with LE cellulitis.  A1c 12.5% shows extremely poor glucose control at home.  PCP is Dr. Kenton Kingfisher.  Spoke to patient about his current A1c of 12.5%.  Explained what an A1c is and what it measures.  Reminded patient that his goal A1c is 7% or less per ADA standards to prevent both acute and long-term complications.  Encouraged patient to check his CBGs at least bid at home (fasting and another check within the day) and to record all CBGs in a logbook for his PCP to review.  Patient stated to me that he does not have a CBG meter at home.  Will need CBG meter Rx at d/c.  Based on his A1c of 12.5%, patient will likely need insulin at d/c.  Was taking Metformin and Invokana prior to admission without success.  Patient stated he would be interested in using insulin pens at home.  Educated patient and spouse on insulin pen use at home.  Reviewed contents of insulin flexpen starter kit.  Reviewed all steps if insulin pen including attachment of needle, 2-unit air shot, dialing up dose, giving injection, removing needle, disposal of sharps, storage of unused insulin, disposal of insulin etc.  Patient able to provide successful return demonstration.  Also reviewed troubleshooting with insulin pen.  MD to give patient Rxs for insulin pens and insulin pen needles.  Also reviewed s/sxs of hypoglycemia at home and how to treat.  MD- Note that Levemir increased to 14 units QHS today and also noted that Novolog MC increased to 6 units tid today.  MD- Please make sure to give patient the following Rxs at d/c:  1. Levemir Flexpen 2. Novolog Flexpen (if Novolog ordered at d/c) 3. Insulin pen needles (32 gauge x10m) 4. CBG meter rx  (patient does not have CBG meter at home)    Will follow. JWyn QuakerRN, MSN, CDE Diabetes Coordinator Inpatient Diabetes Program Team Pager: [phone](8a-10p)

## 2014-01-11 NOTE — Progress Notes (Signed)
Pt watched first 3 diabetic videos. Diabetic coordinator showed pt use of insulin pen. RN reviewed insulin pen use and gave pt insulin pen kit. pt gave teachback on proper administration of insulin pen. Devoria Albe RN

## 2014-01-11 NOTE — Progress Notes (Signed)
Addendum  Patient seen and examined, chart and data base reviewed.  I agree with the above assessment and plan.  For full details please see Mrs. Algis DownsMarianne York PA note.  Gas gangrene of the right foot, patient is on vancomycin, Zosyn and clindamycin.  Status post debridement done by Dr. Rennis ChrisSupple, patient will need further debridement.  We will consult ID in a.m. for further antibiotics recommendation.   Clint LippsMutaz A Aylyn Wenzler, MD [facility] Pager: [phone] 01/11/2014, 4:20 PM

## 2014-01-11 NOTE — Progress Notes (Signed)
ANTIBIOTIC CONSULT NOTE - Follow up  Pharmacy Consult for Zosyn, Vancomycin  Indication: cellulitis and abscess/sepsis  Allergies  Allergen Reactions  . Minocycline Anaphylaxis  . Shellfish Allergy Nausea And Vomiting    Patient Measurements: Height: 6\' 3"  (190.5 cm) Weight: 352 lb 11.2 oz (159.984 kg) IBW/kg (Calculated) : 84.5 Adjusted Body Weight: 115kg  Vital Signs: Temp: 98.9 F (37.2 C) (02/09 1314) Temp src: Oral (02/09 1314) BP: 174/92 mmHg (02/09 1314) Pulse Rate: 80 (02/09 1314) Intake/Output from previous day: 02/08 0701 - 02/09 0700 In: 1350 [IV Piggyback:1350] Out: 1500 [Urine:1500] Intake/Output from this shift: Total I/O In: 480 [P.O.:480] Out: -   Labs:  Recent Labs  01/09/14 0546 01/10/14 0700 01/11/14 0945  WBC 35.3* 33.6* 28.1*  HGB 10.8* 10.6* 11.0*  PLT 328 355 390  CREATININE 1.53* 1.39* 1.13   Estimated Creatinine Clearance: 133.9 ml/min (by C-G formula based on Cr of 1.13). No results found for this basename: VANCOTROUGH, VANCOPEAK, VANCORANDOM, GENTTROUGH, GENTPEAK, GENTRANDOM, TOBRATROUGH, TOBRAPEAK, TOBRARND, AMIKACINPEAK, AMIKACINTROU, AMIKACIN,  in the last 72 hours    Assessment: [age] YOM with history of DM on IV antibiotics- vancomycin, clindamycin and Zosyn for right foot cellulitis, abscess. MRI of foot concerning for necrotizing fasciitis.S/p debridement in OR on 2/7. Per ortho, pt will require further debridement. SCr is 1.13mg /dL with est CrCl >401UU/VOZ>100mL/min. WBC has trended down to 28.1, Afebrile. No results on blood cultures yet.  Goal of Therapy:  Eradication of infection  Plan:  1. Zosyn 3.375gm IV q8h extended interval dosing starting at 2100. 2. Vancomycin 1500 mg IV Q 12 hours  3. Clindamycin 600 mg IV Q 8 hours per physician orders 4. Follow c/s and narrow spectrum of therapy when able 5. Follow renal function and adjust dosing as needed 6. Vanc trough scheduled for tonight at 2330.   Vinnie LevelBenjamin Maggi Hershkowitz, PharmD.   Clinical Pharmacist Pager [phone]

## 2014-01-11 NOTE — Consult Note (Signed)
Reason for Consult: Cellulitis abscess open wound right foot status post irrigation and debridement. Referring Physician: Dr. supple  [name] [name] is an 48 y.o. male.  HPI: Patient is a 48 year old gentleman with diabetic insensate neuropathy he noted an acute onset of redness swelling ulceration to his right foot. Patient came to the emergency room and underwent emergent irrigation and debridement of the abscess of the right forefoot with Dr. supple. Patient is currently on aggressive IV antibiotics and is seen in consultation for foot salvage intervention.  Past Medical History  Diagnosis Date  . Hypertension   . Diabetes mellitus   . Diverticulitis   . Cellulitis and abscess of leg 01/08/2014    RT LEG    Past Surgical History  Procedure Laterality Date  . Colon surgery    . I&d extremity Right 01/09/2014    Procedure: IRRIGATION AND DEBRIDEMENT EXTREMITY;  Surgeon: Marin Shutter, MD;  Location: [facility];  Service: Orthopedics;  Laterality: Right;    Family History  Problem Relation Age of Onset  . Hypertension Mother   . Diabetes Mellitus II Father     Social History:  reports that he has never smoked. He has never used smokeless tobacco. He reports that he drinks alcohol. He reports that he does not use illicit drugs.  Allergies:  Allergies  Allergen Reactions  . Minocycline Anaphylaxis  . Shellfish Allergy Nausea And Vomiting    Medications: I have reviewed the patient's current medications.  Results for orders placed during the hospital encounter of 01/08/14 (from the past 48 hour(s))  GLUCOSE, CAPILLARY     Status: Abnormal   Collection Time    01/09/14  9:10 PM      Result Value Range   Glucose-Capillary 211 (*) 70 - 99 mg/dL   Comment 1 Notify RN    GLUCOSE, CAPILLARY     Status: Abnormal   Collection Time    01/09/14 11:22 PM      Result Value Range   Glucose-Capillary 204 (*) 70 - 99 mg/dL  BASIC METABOLIC PANEL     Status: Abnormal   Collection Time    01/10/14  7:00 AM      Result Value Range   Sodium 135 (*) 137 - 147 mEq/L   Potassium 4.0  3.7 - 5.3 mEq/L   Chloride 97  96 - 112 mEq/L   CO2 20  19 - 32 mEq/L   Glucose, Bld 222 (*) 70 - 99 mg/dL   BUN 32 (*) 6 - 23 mg/dL   Creatinine, Ser 1.39 (*) 0.50 - 1.35 mg/dL   Calcium 8.4  8.4 - 10.5 mg/dL   GFR calc non Af Amer 60 (*) >90 mL/min   GFR calc Af Amer 69 (*) >90 mL/min   Comment: (NOTE)     The eGFR has been calculated using the CKD EPI equation.     This calculation has not been validated in all clinical situations.     eGFR's persistently <90 mL/min signify possible Chronic Kidney     Disease.  CBC     Status: Abnormal   Collection Time    01/10/14  7:00 AM      Result Value Range   WBC 33.6 (*) 4.0 - 10.5 K/uL   Comment: CONSISTENT WITH PREVIOUS RESULT   RBC 3.95 (*) 4.22 - 5.81 MIL/uL   Hemoglobin 10.6 (*) 13.0 - 17.0 g/dL   HCT 31.2 (*) 39.0 - 52.0 %   MCV 79.0  78.0 -  100.0 fL   MCH 26.8  26.0 - 34.0 pg   MCHC 34.0  30.0 - 36.0 g/dL   RDW 14.6  11.5 - 15.5 %   Platelets 355  150 - 400 K/uL  GLUCOSE, CAPILLARY     Status: Abnormal   Collection Time    01/10/14  7:58 AM      Result Value Range   Glucose-Capillary 220 (*) 70 - 99 mg/dL  GLUCOSE, CAPILLARY     Status: Abnormal   Collection Time    01/10/14 11:47 AM      Result Value Range   Glucose-Capillary 249 (*) 70 - 99 mg/dL  GLUCOSE, CAPILLARY     Status: Abnormal   Collection Time    01/10/14  5:03 PM      Result Value Range   Glucose-Capillary 250 (*) 70 - 99 mg/dL  GLUCOSE, CAPILLARY     Status: Abnormal   Collection Time    01/10/14  9:44 PM      Result Value Range   Glucose-Capillary 230 (*) 70 - 99 mg/dL   Comment 1 Notify RN    GLUCOSE, CAPILLARY     Status: Abnormal   Collection Time    01/11/14  8:01 AM      Result Value Range   Glucose-Capillary 204 (*) 70 - 99 mg/dL  CBC     Status: Abnormal   Collection Time    01/11/14  9:45 AM      Result Value Range   WBC 28.1 (*) 4.0 - 10.5  K/uL   RBC 4.09 (*) 4.22 - 5.81 MIL/uL   Hemoglobin 11.0 (*) 13.0 - 17.0 g/dL   HCT 32.3 (*) 39.0 - 52.0 %   MCV 79.0  78.0 - 100.0 fL   MCH 26.9  26.0 - 34.0 pg   MCHC 34.1  30.0 - 36.0 g/dL   RDW 14.7  11.5 - 15.5 %   Platelets 390  150 - 400 K/uL  BASIC METABOLIC PANEL     Status: Abnormal   Collection Time    01/11/14  9:45 AM      Result Value Range   Sodium 135 (*) 137 - 147 mEq/L   Potassium 3.7  3.7 - 5.3 mEq/L   Chloride 98  96 - 112 mEq/L   CO2 22  19 - 32 mEq/L   Glucose, Bld 251 (*) 70 - 99 mg/dL   BUN 23  6 - 23 mg/dL   Creatinine, Ser 1.13  0.50 - 1.35 mg/dL   Calcium 8.6  8.4 - 10.5 mg/dL   GFR calc non Af Amer 77 (*) >90 mL/min   GFR calc Af Amer 89 (*) >90 mL/min   Comment: (NOTE)     The eGFR has been calculated using the CKD EPI equation.     This calculation has not been validated in all clinical situations.     eGFR's persistently <90 mL/min signify possible Chronic Kidney     Disease.  GLUCOSE, CAPILLARY     Status: Abnormal   Collection Time    01/11/14 12:37 PM      Result Value Range   Glucose-Capillary 173 (*) 70 - 99 mg/dL  GLUCOSE, CAPILLARY     Status: Abnormal   Collection Time    01/11/14  5:17 PM      Result Value Range   Glucose-Capillary 215 (*) 70 - 99 mg/dL    No results found.  Review of Systems  All other systems reviewed  and are negative.   Blood pressure 174/92, pulse 80, temperature 98.9 F (37.2 C), temperature source Oral, resp. rate 18, height 6' 3"  (1.905 m), weight 159.984 kg (352 lb 11.2 oz), SpO2 96.00%. Physical Exam On examination patient has a palpable dorsalis pedis pulse. He has a large wound through the second ray right foot. There is ischemic skin changes around the wound edges and there is some ischemic tissue deep within the wound. There is no purulence. There is cellulitis of the right lower extremity. Assessment/Plan: Assessment: Diabetic insensate neuropathy status post aggressive emergent irrigation and  debridement for an abscess to the right foot.  Plan: We'll start hydrotherapy and continue with his IV antibiotics. We will plan for repeat surgery on Friday. This should give his foot time to resolve some of the periwound cellulitis provide time for the pulsatile lavage to help decrease the bacterial load and provide [place] the best chance for foot salvage intervention. Discussed with the patient that we will try to save as much of his foot as possible.  Mahi Zabriskie V 01/11/2014, 6:57 PM

## 2014-01-11 NOTE — Consult Note (Signed)
Reviewed records, patient s/p irrigation and debridement of right forefoot 01/10/14.  Orders per orthopedics for BID saline packing to the dorsal and plantar open wounds on the right foot.  Bedside nursing performs routine wound care as ordered. WOC will not consult unless needed for recommendations for other topical care.  M.Kimmie Doren RN,CWOCN [phone]

## 2014-01-12 DIAGNOSIS — E1159 Type 2 diabetes mellitus with other circulatory complications: Secondary | ICD-10-CM

## 2014-01-12 DIAGNOSIS — IMO0001 Reserved for inherently not codable concepts without codable children: Secondary | ICD-10-CM

## 2014-01-12 DIAGNOSIS — A480 Gas gangrene: Secondary | ICD-10-CM

## 2014-01-12 DIAGNOSIS — I96 Gangrene, not elsewhere classified: Secondary | ICD-10-CM

## 2014-01-12 DIAGNOSIS — L97409 Non-pressure chronic ulcer of unspecified heel and midfoot with unspecified severity: Secondary | ICD-10-CM

## 2014-01-12 DIAGNOSIS — E1165 Type 2 diabetes mellitus with hyperglycemia: Secondary | ICD-10-CM

## 2014-01-12 LAB — CBC
HCT: 31.8 % — ABNORMAL LOW (ref 39.0–52.0)
Hemoglobin: 10.6 g/dL — ABNORMAL LOW (ref 13.0–17.0)
MCH: 26.3 pg (ref 26.0–34.0)
MCHC: 33.3 g/dL (ref 30.0–36.0)
MCV: 78.9 fL (ref 78.0–100.0)
PLATELETS: 392 10*3/uL (ref 150–400)
RBC: 4.03 MIL/uL — ABNORMAL LOW (ref 4.22–5.81)
RDW: 14.6 % (ref 11.5–15.5)
WBC: 22.6 10*3/uL — AB (ref 4.0–10.5)

## 2014-01-12 LAB — GLUCOSE, CAPILLARY
GLUCOSE-CAPILLARY: 128 mg/dL — AB (ref 70–99)
Glucose-Capillary: 166 mg/dL — ABNORMAL HIGH (ref 70–99)
Glucose-Capillary: 150 mg/dL — ABNORMAL HIGH (ref 70–99)
Glucose-Capillary: 178 mg/dL — ABNORMAL HIGH (ref 70–99)

## 2014-01-12 LAB — MAGNESIUM: Magnesium: 2.6 mg/dL — ABNORMAL HIGH (ref 1.5–2.5)

## 2014-01-12 LAB — BASIC METABOLIC PANEL
BUN: 13 mg/dL (ref 6–23)
CALCIUM: 8.4 mg/dL (ref 8.4–10.5)
CO2: 24 mEq/L (ref 19–32)
Chloride: 102 mEq/L (ref 96–112)
Creatinine, Ser: 1 mg/dL (ref 0.50–1.35)
GFR calc Af Amer: >90 mL/min (ref >90)
GFR, EST NON AFRICAN AMERICAN: 89 mL/min — AB (ref >90)
Glucose, Bld: 157 mg/dL — ABNORMAL HIGH (ref 70–99)
Potassium: 3.5 mEq/L — ABNORMAL LOW (ref 3.7–5.3)
Sodium: 139 mEq/L (ref 137–147)

## 2014-01-12 LAB — SURGICAL PCR SCREEN
MRSA, PCR: NEGATIVE
Staphylococcus aureus: NEGATIVE

## 2014-01-12 LAB — VANCOMYCIN, TROUGH: Vancomycin Tr: 12.4 ug/mL (ref 10.0–20.0)

## 2014-01-12 LAB — MRSA PCR SCREENING: MRSA by PCR: NEGATIVE

## 2014-01-12 MED ORDER — VANCOMYCIN HCL 10 G IV SOLR
1250.0000 mg | Freq: Three times a day (TID) | INTRAVENOUS | Status: DC
  Administered 2014-01-12 – 2014-01-14 (×5): 1250 mg via INTRAVENOUS
  Filled 2014-01-12 (×6): qty 1250

## 2014-01-12 MED ORDER — POTASSIUM CHLORIDE CRYS ER 20 MEQ PO TBCR
40.0000 meq | EXTENDED_RELEASE_TABLET | Freq: Once | ORAL | Status: AC
  Administered 2014-01-12: 40 meq via ORAL
  Filled 2014-01-12: qty 2

## 2014-01-12 MED ORDER — LISINOPRIL 20 MG PO TABS
20.0000 mg | ORAL_TABLET | Freq: Every day | ORAL | Status: DC
  Administered 2014-01-13 – 2014-01-14 (×2): 20 mg via ORAL
  Filled 2014-01-12 (×3): qty 1

## 2014-01-12 NOTE — Progress Notes (Signed)
ANTIBIOTIC CONSULT NOTE - Follow up  Pharmacy Consult for Zosyn, Vancomycin  Indication: cellulitis and abscess/sepsis  Allergies  Allergen Reactions  . Minocycline Anaphylaxis  . Shellfish Allergy Nausea And Vomiting    Patient Measurements: Height: 6\' 3"  (190.5 cm) Weight: 352 lb 11.2 oz (159.984 kg) IBW/kg (Calculated) : 84.5 Adjusted Body Weight: 115kg  Vital Signs: Temp: 98.8 F (37.1 C) (02/10 0450) Temp src: Oral (02/10 0450) BP: 174/74 mmHg (02/10 0450) Pulse Rate: 78 (02/10 0450) Intake/Output from previous day: 02/09 0701 - 02/10 0700 In: 820 [P.O.:720; IV Piggyback:100] Out: -  Intake/Output from this shift:    Labs:  Recent Labs  01/10/14 0700 01/11/14 0945 01/12/14 0624  WBC 33.6* 28.1* 22.6*  HGB 10.6* 11.0* 10.6*  PLT 355 390 392  CREATININE 1.39* 1.13 1.00   Estimated Creatinine Clearance: 151.3 ml/min (by C-G formula based on Cr of 1).  Recent Labs  01/12/14 1130  VANCOTROUGH 12.4      Assessment: 47 YOM with history of DM on IV antibiotics- vancomycin, clindamycin and Zosyn for right foot cellulitis, abscess. MRI of foot concerning for necrotizing fasciitis.S/p debridement in OR on 2/7. Per ortho, pt will require further debridement. SCr is 1 mg/dL with est CrCl >829FA/OZH>100mL/min. WBC has trended down to 22.6, Afebrile. No results on blood cultures yet. A 11.5 hour vancomycin trough was sub-therapeutic at 12.4. Will increase Vanc dose today.   Clinda 2/6>> Zosyn 2/6>> Vanco 2/6>>   2/6 BCx2>>ngtd  2/7: L foot wound cx>> moderate gram positive cocci    Goal of Therapy:  Eradication of infection  Plan:  1. Zosyn 3.375gm IV q8h extended interval dosing starting at 2100. 2. Increase Vancomycin to 1250 mg IV Q 8 hours  3. Clindamycin 600 mg IV Q 8 hours per physician orders 4. Follow c/s and narrow spectrum of therapy when able 5. Collect vanc trough at steady state.  6. Follow up ID recommendations.   Vinnie LevelBenjamin Lottie Siska, PharmD.   Clinical Pharmacist Pager [phone]

## 2014-01-12 NOTE — Progress Notes (Signed)
[facility] PROGRESS NOTE  BRADYN SOWARD OZH:[id number] DOB: [birth year] DOA: 01/08/2014 PCP: [name], MD  HPI/Subjective: [name] is a 48yo man with PMH of DM2 and HTN who presents with right foot drainage, swelling. MRI showed sub-cutaneous gas but no indication of osteomyelitis. He is s/p debridement and irrigation of the right foot; done on 2/7.   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 Rennis Chris consulted Dr. Lajoyce Corners who will take the patient to the OR on Friday. -Blood cultures pending  -anaerobic Wound cultures growing Gram + cocci in pairs  -Continue IV vancomycin, zosyn, clindamycin; have consulted ID (Dr. Orvan Falconer) for antibiotic guidance -Hydrotherapy ordered by Dr. Lajoyce Corners.  Continue Tylenol/Hydrocodone for pain management     H/O Malignant HTN  - BP only moderately controlled. -Continue home regimen of clonidine and hydralazine.  Tapering clonidine will d/c it on 2/11. -Added metoprolol 12.5 mg bid and Lisinopril 10 mg Q day on 2/9. Increase lisinopril to 20 mg on 2/11.   DM2  -Hgb A1C: 12.5 on admission; on Metformin and Invokana at home -Continue SSI and levemir.  CBGs much better controlled. -He will be discharged on insulin. Will need levemir and novolog flex pens and Rx for meter.  Acute rise in Cr, AKI  -on 2/7, Cr of 1.53 secondary to acute infection. -resolved and back to baseline   Mild Hypokalemia  -Repleted.  Continue to replete as needed. -Add on serum magnesium -Will monitor with BMET in am   Mild Hyponatremia  -Resolved with IVF -Will monitor with BMET in am   DVT Prophylaxis:  SCDs and Lovenox  Code Status: Full  Family Communication:  Disposition Plan: Remain inpatient   Consultants:  Orthopaedics (Drs Rennis Chris and Lajoyce Corners)  Procedures:  Irrigation and debridement extremity right  Antibiotics: Anti-infectives    Start     Dose/Rate Route Frequency Ordered Stop   01/09/14 0000  vancomycin (VANCOCIN) 1,500 mg in sodium chloride 0.9 % 500 mL IVPB     1,500 mg 250 mL/hr over 120 Minutes Intravenous Every 12 hours 01/08/14 1236     01/08/14 2100  piperacillin-tazobactam (ZOSYN) IVPB 3.375 g     3.375 g 12.5 mL/hr over 240 Minutes Intravenous 3 times per day 01/08/14 1526     01/08/14 1530  clindamycin (CLEOCIN) IVPB 600 mg     600 mg 100 mL/hr over 30 Minutes Intravenous 3 times per day 01/08/14 1514     01/08/14 1230  vancomycin (VANCOCIN) IVPB 1000 mg/200 mL premix  Status:  Discontinued     1,000 mg 200 mL/hr over 60 Minutes Intravenous  Once 01/08/14 1103 01/08/14 1514   01/08/14 1200  piperacillin-tazobactam (ZOSYN) IVPB 3.375 g     3.375 g 12.5 mL/hr over 240 Minutes Intravenous  Once 01/08/14 1152 01/08/14 1627   01/08/14 1200  clindamycin (CLEOCIN) IVPB 600 mg     600 mg 100 mL/hr over 30 Minutes Intravenous  Once 01/08/14 1152 01/08/14 1257   01/08/14 1130  vancomycin (VANCOCIN) IVPB 1000 mg/200 mL premix     1,000 mg 200 mL/hr over 60 Minutes Intravenous  Once 01/08/14 1103 01/08/14 1227     Objective: Filed Vitals:   01/11/14 1035 01/11/14 1314 01/11/14 2143 01/12/14 0450  BP: 175/97 174/92 183/85 174/74  Pulse: 84 80 87 78  Temp:  98.9 F (37.2 C) 99 F (37.2 C) 98.8 F (37.1 C)  TempSrc:  Oral Oral Oral  Resp:  18 20 24   Height:      Weight:      SpO2:  96% 96% 97%    Intake/Output Summary (Last 24 hours) at 01/12/14 1205 Last data filed at 01/11/14 1520  Gross per 24 hour  Intake    340 ml  Output      0 ml  Net    340 ml   Filed Weights   01/08/14 1422  Weight: 159.984 kg (352 lb 11.2 oz)   Exam: General: Obese, NAD, appears stated age.  Speaks as though he is feeling well, but grimaces as though he's in pain. Cardiovascular: RRR, S1 S2 auscultated, no rubs, murmurs or gallops.   Respiratory: Clear to auscultation bilaterally with equal chest rise  Abdomen:  Soft, nontender, nondistended, + bowel sounds  Extremities: L foot in bandage; lower L leg warm and tender to palpation with mild erythema  Neuro: AAOx3 Psych: Normal affect and demeanor with intact judgement and insight  Data Reviewed: Basic Metabolic Panel:  Recent Labs Lab 01/08/14 1115 01/08/14 1540 01/09/14 0546 01/10/14 0700 01/11/14 0945 01/12/14 0624  NA 131*  --  132* 135* 135* 139  K 4.1  --  3.6* 4.0 3.7 3.5*  CL 89*  --  92* 97 98 102  CO2 20  --  19 20 22 24   GLUCOSE 299*  --  196* 222* 251* 157*  BUN 24*  --  30* 32* 23 13  CREATININE 1.20 1.17 1.53* 1.39* 1.13 1.00  CALCIUM 9.4  --  8.6 8.4 8.6 8.4  Liver Function Tests:  Recent Labs Lab 01/08/14 1115 01/09/14 0546  AST 11 12  ALT 12 11  ALKPHOS 123* 103  BILITOT 0.6 0.5  PROT 8.3 7.1  ALBUMIN 2.6* 1.9*  CBC:  Recent Labs Lab 01/08/14 1115 01/08/14 1540 01/09/14 0546 01/10/14 0700 01/11/14 0945 01/12/14 0624  WBC 38.2* 41.3* 35.3* 33.6* 28.1* 22.6*  NEUTROABS 33.3*  --   --   --   --   --   HGB 12.1* 11.8* 10.8* 10.6* 11.0* 10.6*  HCT 36.3* 33.9* 31.5* 31.2* 32.3* 31.8*  MCV 79.1 78.5 78.4 79.0 79.0 78.9  PLT 344 356 328 355 390 392  CBG:  Recent Labs Lab 01/11/14 0801 01/11/14 1237 01/11/14 1717 01/11/14 2126 01/12/14 0759  GLUCAP 204* 173* 215* 180* 128*    Recent Results (from the past 240 hour(s))  CULTURE, BLOOD (ROUTINE X 2)     Status: None   Collection Time    01/08/14 12:20 PM      Result Value Range Status   Specimen Description BLOOD LEFT FOREARM   Final   Special Requests BOTTLES DRAWN AEROBIC ONLY 5CC EACH   Final   Culture  Setup Time     Final   Value: 01/08/2014 14:10     [facility]formed at [facility]   Culture     Final   Value:        BLOOD CULTURE RECEIVED NO GROWTH TO DATE CULTURE WILL BE HELD FOR 5 DAYS BEFORE ISSUING A FINAL NEGATIVE REPORT     Performed at [facility]   Report Status PENDING   Incomplete  MRSA PCR SCREENING     Status:  None   Collection Time    01/09/14  7:42 AM      Result Value Range Status   MRSA by PCR NEGATIVE  NEGATIVE Final   Comment:  The GeneXpert MRSA Assay (FDA     approved for NASAL specimens     only), is one component of a     comprehensive MRSA colonization     surveillance program. It is not     intended to diagnose MRSA     infection nor to guide or     monitor treatment for     MRSA infections.  WOUND CULTURE     Status: None   Collection Time    01/09/14 10:35 AM      Result Value Range Status   Specimen Description WOUND LEFT FOOT   Final   Special Requests NONE   Final   Gram Stain     Final   Value: RARE WBC PRESENT, PREDOMINANTLY PMN     NO SQUAMOUS EPITHELIAL CELLS SEEN     MODERATE GRAM POSITIVE COCCI IN PAIRS     Performed at [facility]   Culture     Final   Value: NO GROWTH 2 DAYS     Performed at [facility]   Report Status 01/11/2014 FINAL   Final  ANAEROBIC CULTURE     Status: None   Collection Time    01/09/14 10:35 AM      Result Value Range Status   Specimen Description FOOT LEFT   Final   Special Requests NONE   Final   Gram Stain     Final   Value: NO WBC SEEN     NO SQUAMOUS EPITHELIAL CELLS SEEN     ABUNDANT GRAM POSITIVE COCCI IN PAIRS     MODERATE GRAM VARIABLE ROD     Performed at [facility]   Culture     Final   Value: NO ANAEROBES ISOLATED; CULTURE IN PROGRESS FOR 5 DAYS     Performed at [facility]   Report Status PENDING   Incomplete  MRSA PCR SCREENING     Status: None   Collection Time    01/12/14 12:14 AM      Result Value Range Status   MRSA by PCR NEGATIVE  NEGATIVE Final   Comment:            The GeneXpert MRSA Assay (FDA     approved for NASAL specimens     only), is one component of a     comprehensive MRSA colonization     surveillance program. It is not     intended to diagnose MRSA     infection nor to guide or     monitor treatment for     MRSA infections.  SURGICAL  PCR SCREEN     Status: None   Collection Time    01/12/14  3:03 AM      Result Value Range Status   MRSA, PCR NEGATIVE  NEGATIVE Final   Staphylococcus aureus NEGATIVE  NEGATIVE Final   Comment:            The Xpert SA Assay (FDA     approved for NASAL specimens     in patients over 14 years of age),     is one component of     a comprehensive surveillance     program.  Test performance has     been validated by [employer] for patients greater     than or equal to 40 year old.     It is not intended     to diagnose infection nor to  guide or monitor treatment.    Studies: No results found.  Scheduled Meds: . antiseptic oral rinse  15 mL Mouth Rinse q12n4p  . chlorhexidine  15 mL Mouth Rinse BID  . clindamycin (CLEOCIN) IV  600 mg Intravenous Q8H  . cloNIDine  0.1 mg Oral Daily  . docusate sodium  100 mg Oral BID  . enoxaparin (LOVENOX) injection  40 mg Subcutaneous Q24H  . hydrALAZINE  25 mg Oral BID  . insulin aspart  0-15 Units Subcutaneous TID WC  . insulin aspart  6 Units Subcutaneous TID WC  . insulin detemir  20 Units Subcutaneous QHS  . [START ON 01/13/2014] lisinopril  20 mg Oral Daily  . metoprolol tartrate  12.5 mg Oral BID  . piperacillin-tazobactam (ZOSYN)  IV  3.375 g Intravenous Q8H  . potassium chloride  40 mEq Oral Once  . vancomycin  1,500 mg Intravenous Q12H   Principal Problem:   Gas gangrene Active Problems:   Cellulitis and abscess   Malignant hypertension   Diabetes mellitus, type 2   Sepsis   Insulin dependent type 2 diabetes mellitus, uncontrolled  Algis Downs, [place] [facility] Pager [phone]. If 7PM-7AM, please contact night-coverage at [email or web address], password [facility] 01/12/2014, 12:05 PM  LOS: 4 days

## 2014-01-12 NOTE — Consult Note (Signed)
[facility]    Date of Admission:  01/08/2014    Total days of antibiotics ? about week 4        Day 5 vancomycin        Day 5 piperacillin tazobactam              Reason for Consult: Diabetic right foot infection with first toe web space abscess with gas gangrene    Referring Physician: Dr. Clydia LlanoMutaz Elmahi Primary Care Physician: Dr. Leonides Sakeandy Harris  Principal Problem:   Gas gangrene Active Problems:   Cellulitis and abscess   Malignant hypertension   Diabetes mellitus, type 2   Sepsis   Insulin dependent type 2 diabetes mellitus, uncontrolled   . antiseptic oral rinse  15 mL Mouth Rinse q12n4p  . chlorhexidine  15 mL Mouth Rinse BID  . clindamycin (CLEOCIN) IV  600 mg Intravenous Q8H  . cloNIDine  0.1 mg Oral Daily  . docusate sodium  100 mg Oral BID  . enoxaparin (LOVENOX) injection  40 mg Subcutaneous Q24H  . hydrALAZINE  25 mg Oral BID  . insulin aspart  0-15 Units Subcutaneous TID WC  . insulin aspart  6 Units Subcutaneous TID WC  . insulin detemir  20 Units Subcutaneous QHS  . [START ON 01/13/2014] lisinopril  20 mg Oral Daily  . metoprolol tartrate  12.5 mg Oral BID  . piperacillin-tazobactam (ZOSYN)  IV  3.375 g Intravenous Q8H  . vancomycin  1,250 mg Intravenous Q8H    Recommendations: 1. Continue vancomycin and piperacillin tazobactam pending final cultures   Assessment: He has a severe, polymicrobiac, gangrenous soft tissue infection of his foot which has been growing for the past month. I would favor continuing current broad empiric antibiotic therapy for now pending final cultures. However it is quite likely the cultures will remain negative due to prior treatment with antibiotics. Even though there is no bony involvement it may be best to have a PICC placed to treat him for several weeks in order to improve the chance of avoiding amputations. Obviously, better glycemic control will help as well. I will follow up  tomorrow   HPI: [name] [name] is a 48 y.o. 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 He began to develop some redness and swelling of his foot one month ago started on empiric ciprofloxacin. He did not improve and was switched to what sounds like trimethoprim sulfamethoxazole and then switch back to ciprofloxacin. He was admitted to the hospital 2 days ago. The white blood cell count was 38,000 foul-smelling, grossly infected foot. He underwent incision and drainage of an abscess with foul-smelling purulent material was found between the first and second toes. There was extensive necrotic tissue but no obvious bony involvement at the time of surgery or by MRI.    Review of Systems: Review of systems not obtained due to patient factors.  Past Medical History  Diagnosis Date  . Hypertension   . Diabetes mellitus   . Diverticulitis   . Cellulitis and abscess of leg 01/08/2014    RT LEG    History  Substance Use Topics  . Smoking status: Never Smoker   . Smokeless tobacco: Never Used  . Alcohol Use: Yes     Comment: SOCIAL    Family History  Problem Relation Age of Onset  . Hypertension Mother   . Diabetes Mellitus II Father  Allergies  Allergen Reactions  . Minocycline Anaphylaxis  . Shellfish Allergy Nausea And Vomiting    OBJECTIVE: Blood pressure 171/86, pulse 107, temperature 97.8 F (36.6 C), temperature source Oral, resp. rate 22, height 6\' 3"  (1.905 m), weight 159.984 kg (352 lb 11.2 oz), SpO2 96.00%. General: He is very groggy after receiving Vicodin for dressing change Skin: No rash Lungs: Clear Cor: Regular S1 and S2 no murmurs Right foot in bulky dressing  Lab Results Lab Results  Component Value Date   WBC 22.6* 01/12/2014   HGB 10.6* 01/12/2014   HCT 31.8* 01/12/2014   MCV 78.9 01/12/2014   PLT 392 01/12/2014    Lab Results  Component Value Date   CREATININE 1.00 01/12/2014   BUN 13 01/12/2014    NA 139 01/12/2014   K 3.5* 01/12/2014   CL 102 01/12/2014   CO2 24 01/12/2014    Lab Results  Component Value Date   ALT 11 01/09/2014   AST 12 01/09/2014   ALKPHOS 103 01/09/2014   BILITOT 0.5 01/09/2014     Microbiology: Recent Results (from the past 240 hour(s))  CULTURE, BLOOD (ROUTINE X 2)     Status: None   Collection Time    01/08/14 12:20 PM      Result Value Range Status   Specimen Description BLOOD LEFT FOREARM   Final   Special Requests BOTTLES DRAWN AEROBIC ONLY 5CC EACH   Final   Culture  Setup Time     Final   Value: 01/08/2014 14:10     Performed at [facility]   Culture     Final   Value:        BLOOD CULTURE RECEIVED NO GROWTH TO DATE CULTURE WILL BE HELD FOR 5 DAYS BEFORE ISSUING A FINAL NEGATIVE REPORT     Performed at [facility]   Report Status PENDING   Incomplete  MRSA PCR SCREENING     Status: None   Collection Time    01/09/14  7:42 AM      Result Value Range Status   MRSA by PCR NEGATIVE  NEGATIVE Final   Comment:            The GeneXpert MRSA Assay (FDA     approved for NASAL specimens     only), is one component of a     comprehensive MRSA colonization     surveillance program. It is not     intended to diagnose MRSA     infection nor to guide or     monitor treatment for     MRSA infections.  WOUND CULTURE     Status: None   Collection Time    01/09/14 10:35 AM      Result Value Range Status   Specimen Description WOUND LEFT FOOT   Final   Special Requests NONE   Final   Gram Stain     Final   Value: RARE WBC PRESENT, PREDOMINANTLY PMN     NO SQUAMOUS EPITHELIAL CELLS SEEN     MODERATE GRAM POSITIVE COCCI IN PAIRS     Performed at [facility]   Culture     Final   Value: NO GROWTH 2 DAYS     Performed at [facility]   Report Status 01/11/2014 FINAL   Final  ANAEROBIC CULTURE     Status: None   Collection Time    01/09/14 10:35 AM      Result Value Range Status  Specimen Description FOOT LEFT   Final    Special Requests NONE   Final   Gram Stain     Final   Value: NO WBC SEEN     NO SQUAMOUS EPITHELIAL CELLS SEEN     ABUNDANT GRAM POSITIVE COCCI IN PAIRS     MODERATE GRAM VARIABLE ROD     Performed at [facility]   Culture     Final   Value: NO ANAEROBES ISOLATED; CULTURE IN PROGRESS FOR 5 DAYS     Performed at [facility]   Report Status PENDING   Incomplete  MRSA PCR SCREENING     Status: None   Collection Time    01/12/14 12:14 AM      Result Value Range Status   MRSA by PCR NEGATIVE  NEGATIVE Final   Comment:            The GeneXpert MRSA Assay (FDA     approved for NASAL specimens     only), is one component of a     comprehensive MRSA colonization     surveillance program. It is not     intended to diagnose MRSA     infection nor to guide or     monitor treatment for     MRSA infections.  SURGICAL PCR SCREEN     Status: None   Collection Time    01/12/14  3:03 AM      Result Value Range Status   MRSA, PCR NEGATIVE  NEGATIVE Final   Staphylococcus aureus NEGATIVE  NEGATIVE Final   Comment:            The Xpert SA Assay (FDA     approved for NASAL specimens     in patients over 37 years of age),     is one component of     a comprehensive surveillance     program.  Test performance has     been validated by [employer] for patients greater     than or equal to 54 year old.     It is not intended     to diagnose infection nor to     guide or monitor treatment.   MRI OF THE RIGHT FOREFOOT WITHOUT AND WITH CONTRAST 01/09/2014   IMPRESSION:  1. As demonstrated radiographically, there is extensive soft tissue  emphysema within the forefoot medially, suspicious for necrotizing  infection.  2. No drainable abscess identified.  3. No evidence of osteomyelitis. Mild synovial enhancement of the  first metatarsal phalangeal joint is noted, potentially due to  intra-articular spread of infection. However, there is only a small  associated effusion.     By: Roxy Horseman M.D.  On: 01/09/2014 08:21   Cliffton Asters, MD [facility] [phone] pager   [phone] cell 01/12/2014, 2:45 PM

## 2014-01-12 NOTE — Progress Notes (Signed)
Addendum  Patient seen and examined, chart and data base reviewed.  I agree with the above assessment and plan.  For full details please see Mrs. Algis DownsMarianne York PA note.  Gas gangrene in the first webspace, On Clinda, Vanc and Zosyn. Lajoyce Cornersuda and Orvan FalconerCampbell are following.  Currently continue hydrotherapy and likely to have re-debridement on Friday.   Clint LippsMutaz A Tamyra Fojtik, MD [facility] Pager: [phone] 01/12/2014, 12:54 PM

## 2014-01-12 NOTE — Progress Notes (Signed)
Physical Therapy Wound Treatment Patient Details  Name: [name] [name] MRN: [id number] Date of Birth: Feb 04, 1966  Today's Date: 01/12/2014 Time: 1308-6578 Time Calculation (min): 31 min  Subjective  Subjective: Pt states pain is just a little. Patient and Family Stated Goals: Get foot better Date of Onset:  (about 1 month) Prior Treatments: Surgical I&D  Pain Score: Pain Score: 0-No pain  Wound Assessment  Wound 01/08/14 Diabetic ulcer Foot Right around right great toe (Active)  Site / Wound Assessment Yellow;Red;Pink 01/12/2014 10:48 AM  % Wound base Red or Granulating 10% 01/12/2014 10:48 AM  % Wound base Yellow 90% 01/12/2014 10:48 AM  % Wound base Black 0% 01/12/2014 10:48 AM  % Wound base Other (Comment) 0% 01/12/2014 10:48 AM  Peri-wound Assessment Edema;Erythema (blanchable) 01/12/2014 10:48 AM  Wound Length (cm) 20 cm 01/12/2014 10:48 AM  Wound Width (cm) 3 cm 01/12/2014 10:48 AM  Wound Depth (cm) 4 cm 01/12/2014 10:48 AM  Margins Unattacted edges (unapproximated) 01/12/2014 10:48 AM  Closure None 01/12/2014 10:48 AM  Drainage Amount Minimal 01/12/2014 10:48 AM  Drainage Description Serosanguineous 01/12/2014 10:48 AM  Treatment Debridement (Selective);Hydrotherapy (Pulse lavage);Packing (Saline gauze) 01/12/2014 10:48 AM  Dressing Type Compression wrap;Gauze (Comment);Moist to dry 01/12/2014 10:48 AM  Dressing Changed Changed 01/12/2014 10:48 AM  Dressing Status Clean;Dry;Intact 01/12/2014 10:48 AM      Hydrotherapy Pulsed lavage therapy - wound location: rt foot wound Pulsed Lavage with Suction (psi): 8 psi Pulsed Lavage with Suction - Normal Saline Used: 1000 mL Pulsed Lavage Tip: Tip with splash shield Selective Debridement Selective Debridement - Location: rt foot wound Selective Debridement - Tools Used: Forceps;Scissors Selective Debridement - Tissue Removed: yellow slough   Wound Assessment and Plan  Wound Therapy - Assess/Plan/Recommendations Wound Therapy - Clinical  Statement: Pt presents to hydrotherapy with open foot wound on rt foot after surgical debridement.  Can benefit from hydrotherapy to decrease necrotic tissue and promote healing. Wound Therapy - Functional Problem List: Decr mobility due to foot wound. Factors Delaying/Impairing Wound Healing: Diabetes Mellitus;Altered sensation;Infection - systemic/local Hydrotherapy Plan: Debridement;Dressing change;Patient/family education;Pulsatile lavage with suction Wound Therapy - Frequency: 6X / week Wound Therapy - Follow Up Recommendations: Home health RN Wound Plan: see above  Wound Therapy Goals- Improve the function of patient's integumentary system by progressing the wound(s) through the phases of wound healing (inflammation - proliferation - remodeling) by: Decrease Necrotic Tissue to: 75% Decrease Necrotic Tissue - Progress: Goal set today Increase Granulation Tissue to: 25% Increase Granulation Tissue - Progress: Goal set today Goals/treatment plan/discharge plan were made with and agreed upon by patient/family: Yes Time For Goal Achievement: 7 days Wound Therapy - Potential for Goals: Fair  Goals will be updated until maximal potential achieved or discharge criteria met.  Discharge criteria: when goals achieved, discharge from hospital, MD decision/surgical intervention, no progress towards goals, refusal/missing three consecutive treatments without notification or medical reason.  GP     Tryone Kille 01/12/2014, 11:02 AM  Suanne Marker PT [phone]

## 2014-01-13 DIAGNOSIS — L0889 Other specified local infections of the skin and subcutaneous tissue: Secondary | ICD-10-CM

## 2014-01-13 LAB — GLUCOSE, CAPILLARY
GLUCOSE-CAPILLARY: 142 mg/dL — AB (ref 70–99)
GLUCOSE-CAPILLARY: 128 mg/dL — AB (ref 70–99)
GLUCOSE-CAPILLARY: 133 mg/dL — AB (ref 70–99)
Glucose-Capillary: 160 mg/dL — ABNORMAL HIGH (ref 70–99)

## 2014-01-13 LAB — CBC
HCT: 31.1 % — ABNORMAL LOW (ref 39.0–52.0)
HEMOGLOBIN: 10.2 g/dL — AB (ref 13.0–17.0)
MCH: 26.1 pg (ref 26.0–34.0)
MCHC: 32.8 g/dL (ref 30.0–36.0)
MCV: 79.5 fL (ref 78.0–100.0)
Platelets: 426 10*3/uL — ABNORMAL HIGH (ref 150–400)
RBC: 3.91 MIL/uL — AB (ref 4.22–5.81)
RDW: 15.1 % (ref 11.5–15.5)
WBC: 20 10*3/uL — ABNORMAL HIGH (ref 4.0–10.5)

## 2014-01-13 LAB — BASIC METABOLIC PANEL
BUN: 9 mg/dL (ref 6–23)
CO2: 23 meq/L (ref 19–32)
CREATININE: 1.04 mg/dL (ref 0.50–1.35)
Calcium: 8.5 mg/dL (ref 8.4–10.5)
Chloride: 104 mEq/L (ref 96–112)
GFR calc Af Amer: >90 mL/min (ref >90)
GFR, EST NON AFRICAN AMERICAN: 85 mL/min — AB (ref >90)
GLUCOSE: 149 mg/dL — AB (ref 70–99)
Potassium: 3.8 mEq/L (ref 3.7–5.3)
SODIUM: 140 meq/L (ref 137–147)

## 2014-01-13 MED ORDER — METOPROLOL TARTRATE 25 MG PO TABS
25.0000 mg | ORAL_TABLET | Freq: Two times a day (BID) | ORAL | Status: DC
  Administered 2014-01-13 – 2014-01-14 (×4): 25 mg via ORAL
  Filled 2014-01-13 (×7): qty 1

## 2014-01-13 MED ORDER — INSULIN ASPART 100 UNIT/ML ~~LOC~~ SOLN
8.0000 [IU] | Freq: Three times a day (TID) | SUBCUTANEOUS | Status: DC
  Administered 2014-01-13 – 2014-01-19 (×17): 8 [IU] via SUBCUTANEOUS

## 2014-01-13 MED ORDER — AMLODIPINE BESYLATE 10 MG PO TABS
10.0000 mg | ORAL_TABLET | Freq: Every day | ORAL | Status: DC
  Administered 2014-01-13 – 2014-01-19 (×7): 10 mg via ORAL
  Filled 2014-01-13 (×8): qty 1

## 2014-01-13 MED ORDER — CLONIDINE HCL 0.2 MG PO TABS
0.2000 mg | ORAL_TABLET | Freq: Every day | ORAL | Status: DC
  Administered 2014-01-13 – 2014-01-14 (×2): 0.2 mg via ORAL
  Filled 2014-01-13 (×2): qty 1

## 2014-01-13 NOTE — Progress Notes (Signed)
Physical Therapy Wound Treatment Patient Details  Name: [name] [name] MRN: [id number] Date of Birth: [birth year]  Today's Date: 01/13/2014 Time: 3552-1747 Time Calculation (min): 24 min  Subjective  Subjective: No c/o's  Pain Score: Pain Score: 0-No pain  Wound Assessment  Wound 01/08/14 Diabetic ulcer Foot Right around right great toe (Active)  Dressing Type Moist to dry;Compression wrap 01/13/2014 10:22 AM  Dressing Changed Changed 01/13/2014 10:22 AM  Dressing Status Clean;Dry;Intact 01/13/2014 10:22 AM  Site / Wound Assessment Yellow;Red;Pink 01/13/2014 10:22 AM  % Wound base Red or Granulating 10% 01/13/2014 10:22 AM  % Wound base Yellow 85% 01/13/2014 10:22 AM  % Wound base Black 0% 01/13/2014 10:22 AM  % Wound base Other (Comment) 5% 01/13/2014 10:22 AM  Peri-wound Assessment Edema;Erythema (blanchable) 01/13/2014 10:22 AM  Wound Length (cm) 20 cm 01/13/2014 12:25 AM  Wound Width (cm) 3 cm 01/13/2014 12:25 AM  Wound Depth (cm) 4 cm 01/13/2014 12:25 AM  Margins Unattached edges (unapproximated) 01/13/2014 10:22 AM  Closure None 01/13/2014 10:22 AM  Drainage Amount Moderate 01/13/2014 10:22 AM  Drainage Description Serosanguineous 01/13/2014 10:22 AM  Treatment Hydrotherapy (Pulse lavage);Packing (Saline gauze) 01/13/2014 10:22 AM      Hydrotherapy Pulsed lavage therapy - wound location: rt foot wound Pulsed Lavage with Suction (psi): 8 psi Pulsed Lavage with Suction - Normal Saline Used: 1000 mL Pulsed Lavage Tip: Tip with splash shield   Wound Assessment and Plan  Wound Therapy - Assess/Plan/Recommendations Wound Therapy - Clinical Statement: No change in wound status. Hydrotherapy Plan: Debridement;Dressing change;Patient/family education;Pulsatile lavage with suction Wound Therapy - Frequency: 6X / week Wound Therapy - Follow Up Recommendations: Home health RN Wound Plan: see above  Wound Therapy Goals- Improve the function of patient's integumentary system by progressing the  wound(s) through the phases of wound healing (inflammation - proliferation - remodeling) by: Decrease Necrotic Tissue to: 75% Decrease Necrotic Tissue - Progress: Not progressing Increase Granulation Tissue to: 25% Increase Granulation Tissue - Progress: Mot progressing  Goals will be updated until maximal potential achieved or discharge criteria met.  Discharge criteria: when goals achieved, discharge from hospital, MD decision/surgical intervention, no progress towards goals, refusal/missing three consecutive treatments without notification or medical reason.  GP     Tylin Force 01/13/2014, 10:25 AM  Suanne Marker PT [phone]

## 2014-01-13 NOTE — Progress Notes (Signed)
Pt with elevated bp of 190/102. Pt was given 5mg  of hydralazine IV

## 2014-01-13 NOTE — Progress Notes (Signed)
Patient ID: [name] [name], male   DOB: 03/26/1966, 47 y.o.   MRN: [id number] Dressing clean and dry plan for midfoot amputation on Friday afternoon. 

## 2014-01-13 NOTE — Progress Notes (Signed)
Patient ID: [name] [name], male   DOB: March 16, 1966, 48 y.o.   MRN: [id number]         [facility]    Date of Admission:  01/08/2014    Total days of antibiotics 6         Principal Problem:   Gas gangrene Active Problems:   Cellulitis and abscess   Malignant hypertension   Diabetes mellitus, type 2   Sepsis   Insulin dependent type 2 diabetes mellitus, uncontrolled   . amLODipine  10 mg Oral Daily  . antiseptic oral rinse  15 mL Mouth Rinse q12n4p  . chlorhexidine  15 mL Mouth Rinse BID  . clindamycin (CLEOCIN) IV  600 mg Intravenous 3 times per day  . cloNIDine  0.2 mg Oral Daily  . docusate sodium  100 mg Oral BID  . enoxaparin (LOVENOX) injection  40 mg Subcutaneous Q24H  . hydrALAZINE  25 mg Oral BID  . insulin aspart  0-15 Units Subcutaneous TID WC  . insulin aspart  8 Units Subcutaneous TID WC  . insulin detemir  20 Units Subcutaneous QHS  . lisinopril  20 mg Oral Daily  . metoprolol tartrate  25 mg Oral BID  . piperacillin-tazobactam (ZOSYN)  IV  3.375 g Intravenous 3 times per day  . vancomycin  1,250 mg Intravenous Q8H     Objective: Temp:  [97.9 F (36.6 C)-98.4 F (36.9 C)] 97.9 F (36.6 C) (02/11 1345) Pulse Rate:  [72-85] 72 (02/11 1557) Resp:  [15-18] 18 (02/11 1557) BP: (116-201)/(68-105) 166/89 mmHg (02/11 1557) SpO2:  [97 %-100 %] 99 % (02/11 1557)  Lab Results Lab Results  Component Value Date   WBC 20.0* 01/13/2014   HGB 10.2* 01/13/2014   HCT 31.1* 01/13/2014   MCV 79.5 01/13/2014   PLT 426* 01/13/2014    Lab Results  Component Value Date   CREATININE 1.04 01/13/2014   BUN 9 01/13/2014   NA 140 01/13/2014   K 3.8 01/13/2014   CL 104 01/13/2014   CO2 23 01/13/2014    Lab Results  Component Value Date   ALT 11 01/09/2014   AST 12 01/09/2014   ALKPHOS 103 01/09/2014   BILITOT 0.5 01/09/2014      Microbiology: Recent Results (from the past 240 hour(s))  CULTURE, BLOOD (ROUTINE X 2)     Status: None   Collection Time      01/08/14 12:20 PM      Result Value Ref Range Status   Specimen Description BLOOD LEFT FOREARM   Final   Special Requests BOTTLES DRAWN AEROBIC ONLY 5CC EACH   Final   Culture  Setup Time     Final   Value: 01/08/2014 14:10     Performed at [facility]   Culture     Final   Value:        BLOOD CULTURE RECEIVED NO GROWTH TO DATE CULTURE WILL BE HELD FOR 5 DAYS BEFORE ISSUING A FINAL NEGATIVE REPORT     Performed at [facility]   Report Status PENDING   Incomplete  MRSA PCR SCREENING     Status: None   Collection Time    01/09/14  7:42 AM      Result Value Ref Range Status   MRSA by PCR NEGATIVE  NEGATIVE Final   Comment:            The GeneXpert MRSA Assay (FDA     approved for NASAL specimens  only), is one component of a     comprehensive MRSA colonization     surveillance program. It is not     intended to diagnose MRSA     infection nor to guide or     monitor treatment for     MRSA infections.  WOUND CULTURE     Status: None   Collection Time    01/09/14 10:35 AM      Result Value Ref Range Status   Specimen Description WOUND LEFT FOOT   Final   Special Requests NONE   Final   Gram Stain     Final   Value: RARE WBC PRESENT, PREDOMINANTLY PMN     NO SQUAMOUS EPITHELIAL CELLS SEEN     MODERATE GRAM POSITIVE COCCI IN PAIRS     Performed at [facility]   Culture     Final   Value: NO GROWTH 2 DAYS     Performed at [facility]   Report Status 01/11/2014 FINAL   Final  ANAEROBIC CULTURE     Status: None   Collection Time    01/09/14 10:35 AM      Result Value Ref Range Status   Specimen Description FOOT LEFT   Final   Special Requests NONE   Final   Gram Stain     Final   Value: NO WBC SEEN     NO SQUAMOUS EPITHELIAL CELLS SEEN     ABUNDANT GRAM POSITIVE COCCI IN PAIRS     MODERATE GRAM VARIABLE ROD     Performed at [facility]   Culture     Final   Value: NO ANAEROBES ISOLATED; CULTURE IN PROGRESS FOR 5 DAYS      Performed at [facility]   Report Status PENDING   Incomplete  MRSA PCR SCREENING     Status: None   Collection Time    01/12/14 12:14 AM      Result Value Ref Range Status   MRSA by PCR NEGATIVE  NEGATIVE Final   Comment:            The GeneXpert MRSA Assay (FDA     approved for NASAL specimens     only), is one component of a     comprehensive MRSA colonization     surveillance program. It is not     intended to diagnose MRSA     infection nor to guide or     monitor treatment for     MRSA infections.  SURGICAL PCR SCREEN     Status: None   Collection Time    01/12/14  3:03 AM      Result Value Ref Range Status   MRSA, PCR NEGATIVE  NEGATIVE Final   Staphylococcus aureus NEGATIVE  NEGATIVE Final   Comment:            The Xpert SA Assay (FDA     approved for NASAL specimens     in patients over 37 years of age),     is one component of     a comprehensive surveillance     program.  Test performance has     been validated by [employer] for patients greater     than or equal to 28 year old.     It is not intended     to diagnose infection nor to     guide or monitor treatment.   Assessment: He has  a severe polymicrobial foot infection and is scheduled for a transmetatarsal amputation in 2 days. He does not need to continue clindamycin. I will continue empiric vancomycin and piperacillin tazobactam.  Plan: 1. Continue vancomycin and piperacillin tazobactam 2. Discontinue clindamycin  Cliffton AstersJohn Ernesta Trabert, MD [facility] [phone] pager   [phone] cell 01/13/2014, 4:50 PM

## 2014-01-13 NOTE — Progress Notes (Signed)
[facility] PROGRESS NOTE  [name] [name] UJW:[id number] DOB: [birth year] DOA: 01/08/2014 PCP: [name], MD  HPI/Subjective: [name] [name] is a 48yo man with PMH of DM2 and HTN who presents with right foot drainage, swelling. MRI showed sub-cutaneous gas but no indication of osteomyelitis. [name] [name] is s/p debridement and irrigation of the right foot; done on 2/7.   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 Rennis Chris consulted Dr. Lajoyce Corners who will take the patient to the OR on Friday 2/13. -Blood cultures pending  -anaerobic Wound cultures growing Gram + cocci in pairs  -Continue IV vancomycin, zosyn, clindamycin; have consulted ID (Dr. Orvan Falconer) for antibiotic guidance -Hydrotherapy ordered by Dr. Lajoyce Corners.  Continue Tylenol/Hydrocodone for pain management     H/O Malignant HTN  -BP only moderately controlled. (SBP in 200s despite hydralazine IV given) -Was attempting to wean off clonidine, but given uncontrolled BP will resume 0.2 mg bid -Lisinopril 20 mg, Metoprolol 25 mg bid, Norvasc 10 mg, hydralazine 25 mg bid.   DM2  -Hgb A1C: 12.5 on admission; on Metformin and Invokana at home -increased to Novolog 8 units meal coverage and levemir 20 units qhs.  CBGs much better controlled. -[name] [name] will be discharged on insulin. Will need levemir and novolog flex pens and Rx for meter.  Acute rise in Cr, AKI  -on 2/7, Cr of 1.53 secondary to acute infection. -resolved and back to baseline  Mild Hypokalemia  -Repleted.  Continue to replete as needed. -mag is 2.6 -Will monitor with BMET in am   Mild Hyponatremia  -Resolved with IVF -Will monitor with BMET in am   DVT Prophylaxis:  SCDs and Lovenox  Code Status: Full  Family Communication:  Disposition Plan: Remain inpatient   Consultants:  Orthopaedics (Drs Rennis Chris and Lajoyce Corners)  Procedures:  Irrigation and debridement  extremity right  Antibiotics: Anti-infectives   Start     Dose/Rate Route Frequency Ordered Stop   01/12/14 2100  vancomycin (VANCOCIN) 1,250 mg in sodium chloride 0.9 % 250 mL IVPB     1,250 mg 166.7 mL/hr over 90 Minutes Intravenous Every 8 hours 01/12/14 1322     01/09/14 0000  vancomycin (VANCOCIN) 1,500 mg in sodium chloride 0.9 % 500 mL IVPB  Status:  Discontinued     1,500 mg 250 mL/hr over 120 Minutes Intravenous Every 12 hours 01/08/14 1236 01/12/14 1322   01/08/14 2100  piperacillin-tazobactam (ZOSYN) IVPB 3.375 g     3.375 g 12.5 mL/hr over 240 Minutes Intravenous 3 times per day 01/08/14 1526     01/08/14 1530  clindamycin (CLEOCIN) IVPB 600 mg     600 mg 100 mL/hr over 30 Minutes Intravenous 3 times per day 01/08/14 1514     01/08/14 1230  vancomycin (VANCOCIN) IVPB 1000 mg/200 mL premix  Status:  Discontinued     1,000 mg 200 mL/hr over 60 Minutes Intravenous  Once 01/08/14 1103 01/08/14 1514   01/08/14 1200  piperacillin-tazobactam (ZOSYN) IVPB 3.375 g     3.375 g 12.5 mL/hr over 240 Minutes Intravenous  Once 01/08/14 1152 01/08/14 1627   01/08/14 1200  clindamycin (CLEOCIN) IVPB 600 mg     600 mg 100 mL/hr over 30 Minutes Intravenous  Once 01/08/14 1152 01/08/14 1257   01/08/14 1130  vancomycin (VANCOCIN) IVPB 1000 mg/200 mL premix     1,000 mg 200 mL/hr over 60 Minutes Intravenous  Once 01/08/14  1103 01/08/14 1227     Objective: Filed Vitals:   01/13/14 0846 01/13/14 0847 01/13/14 0848 01/13/14 0924  BP: 195/105 195/105 195/105 181/97  Pulse: 76     Temp:      TempSrc:      Resp:      Height:      Weight:      SpO2:        Intake/Output Summary (Last 24 hours) at 01/13/14 1042 Last data filed at 01/13/14 0924  Gross per 24 hour  Intake    120 ml  Output      0 ml  Net    120 ml   Filed Weights   01/08/14 1422  Weight: 159.984 kg (352 lb 11.2 oz)   Exam: General: Obese, NAD, appears stated age.  Speaks as though [name] [name] is feeling well, but grimaces  as though [name] [name]'s in pain. Cardiovascular: RRR, S1 S2 auscultated, no rubs, murmurs or gallops.   Respiratory: Clear to auscultation bilaterally with equal chest rise  Abdomen: Soft, nontender, nondistended, + bowel sounds  Extremities: L foot in bandage; lower L leg warm and tender to palpation with mild erythema  Neuro: AAOx3 Psych: Normal affect and demeanor with intact judgement and insight  Data Reviewed: Basic Metabolic Panel:  Recent Labs Lab 01/09/14 0546 01/10/14 0700 01/11/14 0945 01/12/14 0624 01/12/14 1230 01/13/14 0501  NA 132* 135* 135* 139  --  140  K 3.6* 4.0 3.7 3.5*  --  3.8  CL 92* 97 98 102  --  104  CO2 19 20 22 24   --  23  GLUCOSE 196* 222* 251* 157*  --  149*  BUN 30* 32* 23 13  --  9  CREATININE 1.53* 1.39* 1.13 1.00  --  1.04  CALCIUM 8.6 8.4 8.6 8.4  --  8.5  MG  --   --   --   --  2.6*  --   Liver Function Tests:  Recent Labs Lab 01/08/14 1115 01/09/14 0546  AST 11 12  ALT 12 11  ALKPHOS 123* 103  BILITOT 0.6 0.5  PROT 8.3 7.1  ALBUMIN 2.6* 1.9*  CBC:  Recent Labs Lab 01/08/14 1115  01/09/14 0546 01/10/14 0700 01/11/14 0945 01/12/14 0624 01/13/14 0501  WBC 38.2*  < > 35.3* 33.6* 28.1* 22.6* 20.0*  NEUTROABS 33.3*  --   --   --   --   --   --   HGB 12.1*  < > 10.8* 10.6* 11.0* 10.6* 10.2*  HCT 36.3*  < > 31.5* 31.2* 32.3* 31.8* 31.1*  MCV 79.1  < > 78.4 79.0 79.0 78.9 79.5  PLT 344  < > 328 355 390 392 426*  < > = values in this interval not displayed.CBG:  Recent Labs Lab 01/12/14 0759 01/12/14 1205 01/12/14 1751 01/12/14 2101 01/13/14 0807  GLUCAP 128* 166* 150* 178* 142*    Recent Results (from the past 240 hour(s))  CULTURE, BLOOD (ROUTINE X 2)     Status: None   Collection Time    01/08/14 12:20 PM      Result Value Ref Range Status   Specimen Description BLOOD LEFT FOREARM   Final   Special Requests BOTTLES DRAWN AEROBIC ONLY [facility] EACH   Final   Culture  Setup Time     Final   Value: 01/08/2014 14:10      Performed at [facility]   Culture     Final   Value:  BLOOD CULTURE RECEIVED NO GROWTH TO DATE CULTURE WILL BE HELD FOR 5 DAYS BEFORE ISSUING A FINAL NEGATIVE REPORT     [facility]   Report Status PENDING   Incomplete  MRSA PCR SCREENING     Status: None   Collection Time    01/09/14  7:42 AM      Result Value Ref Range Status   MRSA by PCR NEGATIVE  NEGATIVE Final   Comment:            The GeneXpert MRSA Assay (FDA     approved for NASAL specimens     only), is one component of a     comprehensive MRSA colonization     surveillance program. It is not     intended to diagnose MRSA     infection nor to guide or     monitor treatment for     MRSA infections.  WOUND CULTURE     Status: None   Collection Time    01/09/14 10:35 AM      Result Value Ref Range Status   Specimen Description WOUND LEFT FOOT   Final   Special Requests NONE   Final   Gram Stain     Final   Value: RARE WBC PRESENT, PREDOMINANTLY PMN     NO SQUAMOUS EPITHELIAL CELLS SEEN     MODERATE GRAM POSITIVE COCCI IN PAIRS     [facility]   Culture     Final   Value: NO GROWTH 2 DAYS     [facility]   Report Status 01/11/2014 FINAL   Final  ANAEROBIC CULTURE     Status: None   Collection Time    01/09/14 10:35 AM      Result Value Ref Range Status   Specimen Description FOOT LEFT   Final   Special Requests NONE   Final   Gram Stain     Final   Value: NO WBC SEEN     NO SQUAMOUS EPITHELIAL CELLS SEEN     ABUNDANT GRAM POSITIVE COCCI IN PAIRS     MODERATE GRAM VARIABLE ROD     [facility]   Culture     Final   Value: NO ANAEROBES ISOLATED; CULTURE IN PROGRESS FOR 5 DAYS     [facility]   Report Status PENDING   Incomplete  MRSA PCR SCREENING     Status: None   Collection Time    01/12/14 12:14 AM      Result Value Ref Range Status   MRSA by PCR NEGATIVE  NEGATIVE Final    Comment:            The GeneXpert MRSA Assay (FDA     approved for NASAL specimens     only), is one component of a     comprehensive MRSA colonization     surveillance program. It is not     intended to diagnose MRSA     infection nor to guide or     monitor treatment for     MRSA infections.  SURGICAL PCR SCREEN     Status: None   Collection Time    01/12/14  3:03 AM      Result Value Ref Range Status   MRSA, PCR NEGATIVE  NEGATIVE Final   Staphylococcus aureus NEGATIVE  NEGATIVE Final   Comment:  The Xpert SA Assay (FDA     approved for NASAL specimens     in patients over 74 years of age),     is one component of     a comprehensive surveillance     program.  Test performance has     been validated by [employer] for patients greater     than or equal to 49 year old.     It is not intended     to diagnose infection nor to     guide or monitor treatment.    Studies: No results found.  Scheduled Meds: . amLODipine  10 mg Oral Daily  . antiseptic oral rinse  15 mL Mouth Rinse q12n4p  . chlorhexidine  15 mL Mouth Rinse BID  . clindamycin (CLEOCIN) IV  600 mg Intravenous 3 times per day  . cloNIDine  0.2 mg Oral Daily  . docusate sodium  100 mg Oral BID  . enoxaparin (LOVENOX) injection  40 mg Subcutaneous Q24H  . hydrALAZINE  25 mg Oral BID  . insulin aspart  0-15 Units Subcutaneous TID WC  . insulin aspart  8 Units Subcutaneous TID WC  . insulin detemir  20 Units Subcutaneous QHS  . lisinopril  20 mg Oral Daily  . metoprolol tartrate  25 mg Oral BID  . piperacillin-tazobactam (ZOSYN)  IV  3.375 g Intravenous 3 times per day  . vancomycin  1,250 mg Intravenous Q8H   Principal Problem:   Gas gangrene Active Problems:   Cellulitis and abscess   Malignant hypertension   Diabetes mellitus, type 2   Sepsis   Insulin dependent type 2 diabetes mellitus, uncontrolled  Algis Downs, [place] [facility] Pager [phone]. If 7PM-7AM, please contact  night-coverage at [email or web address], password [facility] 01/13/2014, 10:42 AM  LOS: 5 days   Attending - Patient seen and examined, agree with the above assessment and plan. On empiric antibiotic at the direction of infectious disease, orthopedics planning midfoot amputation this coming Friday. We'll continue to optimize antihypertensive therapy, continue to optimize insulin regimen.  Windell Norfolk MD

## 2014-01-14 LAB — GLUCOSE, CAPILLARY
Glucose-Capillary: 138 mg/dL — ABNORMAL HIGH (ref 70–99)
Glucose-Capillary: 129 mg/dL — ABNORMAL HIGH (ref 70–99)
Glucose-Capillary: 153 mg/dL — ABNORMAL HIGH (ref 70–99)
Glucose-Capillary: 153 mg/dL — ABNORMAL HIGH (ref 70–99)

## 2014-01-14 LAB — CULTURE, BLOOD (ROUTINE X 2): Culture: NO GROWTH

## 2014-01-14 LAB — VANCOMYCIN, TROUGH: VANCOMYCIN TR: 21.1 ug/mL — AB (ref 10.0–20.0)

## 2014-01-14 MED ORDER — INSULIN DETEMIR 100 UNIT/ML ~~LOC~~ SOLN
22.0000 [IU] | Freq: Every day | SUBCUTANEOUS | Status: DC
  Administered 2014-01-14 – 2014-01-18 (×5): 22 [IU] via SUBCUTANEOUS
  Filled 2014-01-14 (×6): qty 0.22

## 2014-01-14 MED ORDER — LISINOPRIL 40 MG PO TABS
40.0000 mg | ORAL_TABLET | Freq: Every day | ORAL | Status: DC
  Administered 2014-01-15: 40 mg via ORAL
  Filled 2014-01-14: qty 1

## 2014-01-14 MED ORDER — CLONIDINE HCL 0.2 MG PO TABS
0.2000 mg | ORAL_TABLET | Freq: Two times a day (BID) | ORAL | Status: DC
  Administered 2014-01-14: 0.2 mg via ORAL
  Filled 2014-01-14 (×3): qty 1

## 2014-01-14 MED ORDER — HYDROCHLOROTHIAZIDE 12.5 MG PO CAPS: 12.5000 mg | ORAL_CAPSULE | Freq: Every day | ORAL | Status: DC

## 2014-01-14 MED ORDER — HYDRALAZINE HCL 50 MG PO TABS
50.0000 mg | ORAL_TABLET | Freq: Three times a day (TID) | ORAL | Status: DC
  Administered 2014-01-14 – 2014-01-15 (×3): 50 mg via ORAL
  Filled 2014-01-14 (×9): qty 1

## 2014-01-14 MED ORDER — VANCOMYCIN HCL IN DEXTROSE 1-5 GM/200ML-% IV SOLN
1000.0000 mg | Freq: Three times a day (TID) | INTRAVENOUS | Status: DC
  Administered 2014-01-14 – 2014-01-16 (×6): 1000 mg via INTRAVENOUS
  Filled 2014-01-14 (×8): qty 200

## 2014-01-14 NOTE — Progress Notes (Signed)
ANTIBIOTIC CONSULT NOTE - Follow up  Pharmacy Consult for Zosyn, Vancomycin  Indication: cellulitis and abscess/sepsis  Allergies  Allergen Reactions  . Minocycline Anaphylaxis  . Shellfish Allergy Nausea And Vomiting    Patient Measurements: Height: 6\' 3"  (190.5 cm) Weight: 352 lb 11.2 oz (159.984 kg) IBW/kg (Calculated) : 84.5 Adjusted Body Weight: 115kg  Vital Signs: Temp: 98.6 F (37 C) (02/12 1002) Temp src: Oral (02/12 1002) BP: 172/79 mmHg (02/12 1130) Pulse Rate: 80 (02/12 1129) Intake/Output from previous day: 02/11 0701 - 02/12 0700 In: 2010 [P.O.:360; IV Piggyback:1650] Out: -  Intake/Output from this shift:    Labs:  Recent Labs  01/12/14 0624 01/13/14 0501  WBC 22.6* 20.0*  HGB 10.6* 10.2*  PLT 392 426*  CREATININE 1.00 1.04   Estimated Creatinine Clearance: 145.5 ml/min (by C-G formula based on Cr of 1.04).  Recent Labs  01/12/14 1130 01/14/14 0501  VANCOTROUGH 12.4 21.1*      Assessment: 47 YOM with history of DM on IV antibiotics- vancomycin and Zosyn for right foot cellulitis, abscess. MRI of foot concerning for necrotizing fasciitis.S/p debridement in OR on 2/7. Per ortho, pt will require further debridement. SCr is 1.04 mg/dL with est CrCl >161WR/UEA>100mL/min. WBC has trended down to 20, Afebrile. Blood cultures negative. A 8 hour vancomycin trough was mildly supra-therapeutic at 21.1. Will decrease Vanc dose today.   Clinda 2/6>>2/11 Zosyn 2/6>> Vanco 2/6>>   2/6 BCx2>>ngtd  2/7: L foot wound cx>> moderate gram positive cocci    Goal of Therapy:  Eradication of infection  Plan:  1. Zosyn 3.375gm IV q8h  2. Decrease Vancomycin to 1000 mg IV Q 8 hours  3. Monitor renal fx and patient clinical status   Vinnie LevelBenjamin Kailer Heindel, PharmD.  Clinical Pharmacist Pager [phone]

## 2014-01-14 NOTE — Progress Notes (Signed)
[facility] PROGRESS NOTE  [name] [name] YNW:[id number] DOB: 1966-11-04 DOA: 01/08/2014 PCP: Johny BlamerHARRIS, WILLIAM, MD  HPI/Subjective: [name] [name] is a 48yo man with PMH of DM2 and HTN who presents with right foot drainage, swelling. MRI showed sub-cutaneous gas but no indication of osteomyelitis. He is s/p debridement and irrigation of the right foot; done on 2/7.   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 Rennis ChrisSupple consulted Dr. Lajoyce Cornersuda who will take the patient to the OR on Friday 2/13. -Blood culture shows no growth - final  -anaerobic Wound cultures growing Gram + cocci in pairs  -Continue IV vancomycin, zosyn, clindamycin; have consulted ID (Dr. Orvan Falconerampbell) for antibiotic guidance -Hydrotherapy ordered by Dr. Lajoyce Cornersuda.  Continue Tylenol/Hydrocodone for pain management     H/O Malignant HTN  -BP slowly improving.  Increased hydralazine 2/12.  Consider adding diuretic at d/c -Was attempting to wean off clonidine, but given uncontrolled BP will resume 0.2 mg bid -lisinopril, clonidine, norvasc, hydralazine.  DM2  -Hgb A1C: 12.5 on admission; on Metformin and Invokana at home -increased to Novolog 8 units meal coverage and levemir 22 units qhs.  CBGs much better controlled. -He will be discharged on insulin. Will need levemir and novolog flex pens and Rx for meter.  Acute rise in Cr, AKI  -on 2/7, Cr of 1.53 secondary to acute infection. -resolved and back to baseline  Mild Hypokalemia  -Repleted.  Continue to replete as needed. -mag is 2.6 -Will monitor with BMET in am   Mild Hyponatremia  -Resolved with IVF -Will monitor with BMET in am   DVT Prophylaxis:  SCDs and Lovenox  Code Status: Full  Family Communication:  Disposition Plan: Remain inpatient   Consultants:  Orthopaedics (Drs Rennis ChrisSupple and Lajoyce Cornersuda)  Procedures:  Irrigation and debridement extremity  right  Antibiotics: Anti-infectives   Start     Dose/Rate Route Frequency Ordered Stop   01/14/14 1300  vancomycin (VANCOCIN) IVPB 1000 mg/200 mL premix     1,000 mg 200 mL/hr over 60 Minutes Intravenous Every 8 hours 01/14/14 0808     01/12/14 2100  vancomycin (VANCOCIN) 1,250 mg in sodium chloride 0.9 % 250 mL IVPB  Status:  Discontinued     1,250 mg 166.7 mL/hr over 90 Minutes Intravenous Every 8 hours 01/12/14 1322 01/14/14 0808   01/09/14 0000  vancomycin (VANCOCIN) 1,500 mg in sodium chloride 0.9 % 500 mL IVPB  Status:  Discontinued     1,500 mg 250 mL/hr over 120 Minutes Intravenous Every 12 hours 01/08/14 1236 01/12/14 1322   01/08/14 2100  piperacillin-tazobactam (ZOSYN) IVPB 3.375 g     3.375 g 12.5 mL/hr over 240 Minutes Intravenous 3 times per day 01/08/14 1526     01/08/14 1530  clindamycin (CLEOCIN) IVPB 600 mg  Status:  Discontinued     600 mg 100 mL/hr over 30 Minutes Intravenous 3 times per day 01/08/14 1514 01/13/14 1652   01/08/14 1230  vancomycin (VANCOCIN) IVPB 1000 mg/200 mL premix  Status:  Discontinued     1,000 mg 200 mL/hr over 60 Minutes Intravenous  Once 01/08/14 1103 01/08/14 1514   01/08/14 1200  piperacillin-tazobactam (ZOSYN) IVPB 3.375 g     3.375 g 12.5 mL/hr over 240 Minutes Intravenous  Once 01/08/14 1152 01/08/14 1627   01/08/14 1200  clindamycin (CLEOCIN) IVPB 600 mg     600 mg 100 mL/hr over 30 Minutes  Intravenous  Once 01/08/14 1152 01/08/14 1257   01/08/14 1130  vancomycin (VANCOCIN) IVPB 1000 mg/200 mL premix     1,000 mg 200 mL/hr over 60 Minutes Intravenous  Once 01/08/14 1103 01/08/14 1227     Objective: Filed Vitals:   01/14/14 0139 01/14/14 0254 01/14/14 0549 01/14/14 1002  BP: 177/82 161/75 175/93 180/91  Pulse: 78 80 82 82  Temp: 98.2 F (36.8 C)  98.6 F (37 C) 98.6 F (37 C)  TempSrc: Oral  Oral Oral  Resp: 16  17 16   Height:      Weight:      SpO2: 97%  96% 98%    Intake/Output Summary (Last 24 hours) at 01/14/14  1035 Last data filed at 01/14/14 0547  Gross per 24 hour  Intake   1890 ml  Output      0 ml  Net   1890 ml   Filed Weights   01/08/14 1422  Weight: 159.984 kg (352 lb 11.2 oz)   Exam: General: Obese, NAD, appears stated age. Lying in bed.   Cardiovascular: RRR, S1 S2 auscultated, no rubs, murmurs or gallops.   Respiratory: Clear to auscultation bilaterally with equal chest rise  Abdomen: massive, Soft, nontender, nondistended, + bowel sounds  Extremities: L foot in bandage; lower L leg warm and tender to palpation with mild erythema  Neuro: AAOx3 Psych: unusual affect but pleasant, cooperative, A&O  Data Reviewed: Basic Metabolic Panel:  Recent Labs Lab 01/09/14 0546 01/10/14 0700 01/11/14 0945 01/12/14 0624 01/12/14 1230 01/13/14 0501  NA 132* 135* 135* 139  --  140  K 3.6* 4.0 3.7 3.5*  --  3.8  CL 92* 97 98 102  --  104  CO2 19 20 22 24   --  23  GLUCOSE 196* 222* 251* 157*  --  149*  BUN 30* 32* 23 13  --  9  CREATININE 1.53* 1.39* 1.13 1.00  --  1.04  CALCIUM 8.6 8.4 8.6 8.4  --  8.5  MG  --   --   --   --  2.6*  --   Liver Function Tests:  Recent Labs Lab 01/08/14 1115 01/09/14 0546  AST 11 12  ALT 12 11  ALKPHOS 123* 103  BILITOT 0.6 0.5  PROT 8.3 7.1  ALBUMIN 2.6* 1.9*  CBC:  Recent Labs Lab 01/08/14 1115  01/09/14 0546 01/10/14 0700 01/11/14 0945 01/12/14 0624 01/13/14 0501  WBC 38.2*  < > 35.3* 33.6* 28.1* 22.6* 20.0*  NEUTROABS 33.3*  --   --   --   --   --   --   HGB 12.1*  < > 10.8* 10.6* 11.0* 10.6* 10.2*  HCT 36.3*  < > 31.5* 31.2* 32.3* 31.8* 31.1*  MCV 79.1  < > 78.4 79.0 79.0 78.9 79.5  PLT 344  < > 328 355 390 392 426*  < > = values in this interval not displayed.CBG:  Recent Labs Lab 01/13/14 0807 01/13/14 1252 01/13/14 1734 01/13/14 2138 01/14/14 0807  GLUCAP 142* 160* 128* 133* 138*    Recent Results (from the past 240 hour(s))  CULTURE, BLOOD (ROUTINE X 2)     Status: None   Collection Time    01/08/14 12:20 PM       Result Value Ref Range Status   Specimen Description BLOOD LEFT FOREARM   Final   Special Requests BOTTLES DRAWN AEROBIC ONLY 5CC EACH   Final   Culture  Setup Time  Final   Value: 01/08/2014 14:10     Performed at [facility]   Culture     Final   Value: NO GROWTH 5 DAYS     Performed at [facility]   Report Status 01/14/2014 FINAL   Final  MRSA PCR SCREENING     Status: None   Collection Time    01/09/14  7:42 AM      Result Value Ref Range Status   MRSA by PCR NEGATIVE  NEGATIVE Final   Comment:            The GeneXpert MRSA Assay (FDA     approved for NASAL specimens     only), is one component of a     comprehensive MRSA colonization     surveillance program. It is not     intended to diagnose MRSA     infection nor to guide or     monitor treatment for     MRSA infections.  WOUND CULTURE     Status: None   Collection Time    01/09/14 10:35 AM      Result Value Ref Range Status   Specimen Description WOUND LEFT FOOT   Final   Special Requests NONE   Final   Gram Stain     Final   Value: RARE WBC PRESENT, PREDOMINANTLY PMN     NO SQUAMOUS EPITHELIAL CELLS SEEN     MODERATE GRAM POSITIVE COCCI IN PAIRS     Performed at [facility]   Culture     Final   Value: NO GROWTH 2 DAYS     Performed at [facility]   Report Status 01/11/2014 FINAL   Final  ANAEROBIC CULTURE     Status: None   Collection Time    01/09/14 10:35 AM      Result Value Ref Range Status   Specimen Description FOOT LEFT   Final   Special Requests NONE   Final   Gram Stain     Final   Value: NO WBC SEEN     NO SQUAMOUS EPITHELIAL CELLS SEEN     ABUNDANT GRAM POSITIVE COCCI IN PAIRS     MODERATE GRAM VARIABLE ROD     Performed at [facility]   Culture     Final   Value: NO ANAEROBES ISOLATED; CULTURE IN PROGRESS FOR 5 DAYS     Performed at [facility]   Report Status PENDING   Incomplete  MRSA PCR SCREENING     Status: None    Collection Time    01/12/14 12:14 AM      Result Value Ref Range Status   MRSA by PCR NEGATIVE  NEGATIVE Final   Comment:            The GeneXpert MRSA Assay (FDA     approved for NASAL specimens     only), is one component of a     comprehensive MRSA colonization     surveillance program. It is not     intended to diagnose MRSA     infection nor to guide or     monitor treatment for     MRSA infections.  SURGICAL PCR SCREEN     Status: None   Collection Time    01/12/14  3:03 AM      Result Value Ref Range Status   MRSA, PCR NEGATIVE  NEGATIVE Final   Staphylococcus aureus NEGATIVE  NEGATIVE Final  Comment:            The Xpert SA Assay (FDA     approved for NASAL specimens     in patients over 56 years of age),     is one component of     a comprehensive surveillance     program.  Test performance has     been validated by [employer] for patients greater     than or equal to 47 year old.     It is not intended     to diagnose infection nor to     guide or monitor treatment.    Studies: No results found.  Scheduled Meds: . amLODipine  10 mg Oral Daily  . antiseptic oral rinse  15 mL Mouth Rinse q12n4p  . chlorhexidine  15 mL Mouth Rinse BID  . cloNIDine  0.2 mg Oral Daily  . docusate sodium  100 mg Oral BID  . enoxaparin (LOVENOX) injection  40 mg Subcutaneous Q24H  . hydrALAZINE  50 mg Oral 3 times per day  . insulin aspart  0-15 Units Subcutaneous TID WC  . insulin aspart  8 Units Subcutaneous TID WC  . insulin detemir  22 Units Subcutaneous QHS  . lisinopril  20 mg Oral Daily  . metoprolol tartrate  25 mg Oral BID  . piperacillin-tazobactam (ZOSYN)  IV  3.375 g Intravenous 3 times per day  . vancomycin  1,000 mg Intravenous Q8H   Principal Problem:   Gas gangrene Active Problems:   Cellulitis and abscess   Malignant hypertension   Diabetes mellitus, type 2   Sepsis   Insulin dependent type 2 diabetes mellitus, uncontrolled  [name] [name],  [place] [facility] Pager [phone]. If 7PM-7AM, please contact night-coverage at [email or web address], password [facility] 01/14/2014, 10:35 AM  LOS: 6 days   Attending Seen and examined, the with the above assessment and plan. For midfoot amputation tomorrow. Change clonidine to twice a day, increase fosinopril to 40 mg. Continue with Norvasc and hydralazine. Appreciate infectious disease and orthopedic evaluation  Windell Norfolk MD

## 2014-01-14 NOTE — Progress Notes (Signed)
HydroTherapy Discharge Patient Details Name: Loleta ChanceReginald F Matsumoto MRN: [id number] DOB: 06-06-1966 Today's Date: 01/14/2014 Time:  -     Patient discharged from PT services secondary to surgery - will need to re-order PT to resume therapy services.  Please see latest therapy progress note for current level of functioning and progress toward goals.    Progress and discharge plan discussed with patient and/or caregiver: Patient/Caregiver agrees with plan  GP     [facility] HospitalMAYCOCK,Tyffany Waldrop 01/14/2014, 11:37 AM

## 2014-01-14 NOTE — Care Management Note (Unsigned)
    Page 1 of 1   01/14/2014     5:48:43 PM   CARE MANAGEMENT NOTE 01/14/2014  Patient:  Loleta ChanceLEAKE,Veronica F   Account Number:  [id number]  Date Initiated:  01/14/2014  Documentation initiated by:  Letha CapeAYLOR,Mical Kicklighter  Subjective/Objective Assessment:   dx gas gangrene of r fore foot  admit- lives with family.     Action/Plan:   pt- receiving hydrotherapy  2/13 scheduled for transmetatarsal amputaion   Anticipated DC Date:  01/17/2014   Anticipated DC Plan:  HOME W HOME HEALTH SERVICES      DC Planning Services  CM consult      Choice offered to / List presented to:             Status of service:  In process, will continue to follow Medicare Important Message given?   (If response is "NO", the following Medicare IM given date fields will be blank) Date Medicare IM given:   Date Additional Medicare IM given:    Discharge Disposition:    Per UR Regulation:  Reviewed for med. necessity/level of care/duration of stay  If discussed at Long Length of Stay Meetings, dates discussed:   01/14/2014    Comments:  01/14/14 Letha CapeDeborah Danuel Felicetti RN,BSN [phone] patient is scheduled for transmetatarsal  amputation in am. Will need to be seen by pt sometime after surgery. NCM will continue to follow for dc needs.

## 2014-01-14 NOTE — Progress Notes (Signed)
Telephone call to pharmacy spoke with pharmacist informed that lab draw vanc trough was drawn at 0500 and med is due. He stated that is ok to give the concern was level would be low and will adjust next dose. Eiman Maret LPN

## 2014-01-14 NOTE — Progress Notes (Signed)
Physical Therapy Wound Treatment Patient Details  Name: TODD JELINSKI MRN: [id number] Date of Birth: 1966/09/09  Today's Date: 01/14/2014 Time: 1013-1028 Time Calculation (min): 15 min  Subjective  Subjective: Pt states his surgery is scheduled for 2 PM tomorrow.  Pain Score: Pain Score: 0-No pain  Wound Assessment  Wound 01/08/14 Diabetic ulcer Foot Right around right great toe (Active)  Dressing Type Moist to dry;Compression wrap 01/14/2014 11:30 AM  Dressing Changed Changed 01/14/2014 11:30 AM  Dressing Status Clean;Dry;Intact 01/14/2014 11:30 AM  Dressing Change Frequency Daily 01/14/2014 11:30 AM  Site / Wound Assessment Yellow;Red;Pink 01/14/2014 11:30 AM  % Wound base Red or Granulating 10% 01/14/2014 11:30 AM  % Wound base Yellow 85% 01/14/2014 11:30 AM  % Wound base Black 5% 01/14/2014 11:30 AM  % Wound base Other (Comment) 5% 01/13/2014 10:22 AM  Peri-wound Assessment Edema;Erythema (blanchable) 01/14/2014 11:30 AM  Wound Length (cm) 20 cm 01/13/2014 12:25 AM  Wound Width (cm) 3 cm 01/13/2014 12:25 AM  Wound Depth (cm) 4 cm 01/13/2014 12:25 AM  Margins Unattached edges (unapproximated) 01/14/2014 11:30 AM  Closure None 01/14/2014 11:30 AM  Drainage Amount Moderate 01/14/2014 11:30 AM  Drainage Description Serosanguineous 01/14/2014 11:30 AM  Treatment Hydrotherapy (Pulse lavage);Packing (Saline gauze) 01/14/2014 11:30 AM  Closure None 01/11/2014  1:00 PM  Drainage Amount Minimal 01/11/2014  1:00 PM  Drainage Description Serosanguineous 01/11/2014  1:00 PM  Treatment Cleansed 01/11/2014  1:00 PM   Hydrotherapy Pulsed lavage therapy - wound location: rt foot wound Pulsed Lavage with Suction (psi): 8 psi Pulsed Lavage with Suction - Normal Saline Used: 1000 mL Pulsed Lavage Tip: Tip with splash shield   Wound Assessment and Plan  Wound Therapy - Assess/Plan/Recommendations Wound Therapy - Clinical Statement: No change in wound status.  Pt for midfoot amputation tomorrow. Hydrotherapy will  sign off due to scheduled for amputation. Wound Therapy - Follow Up Recommendations: Other (comment) (per surgery recomendations.) Wound Plan: see above  Wound Therapy Goals- Improve the function of patient's integumentary system by progressing the wound(s) through the phases of wound healing (inflammation - proliferation - remodeling) by: Decrease Necrotic Tissue - Progress: Not met Increase Granulation Tissue - Progress: Not met  Goals will be updated until maximal potential achieved or discharge criteria met.  Discharge criteria: when goals achieved, discharge from hospital, MD decision/surgical intervention, no progress towards goals, refusal/missing three consecutive treatments without notification or medical reason.  GP     Anelia Carriveau 01/14/2014, 11:36 AM  Suanne Marker PT [phone]

## 2014-01-15 ENCOUNTER — Inpatient Hospital Stay (HOSPITAL_COMMUNITY): Payer: Managed Care, Other (non HMO) | Admitting: Certified Registered"

## 2014-01-15 ENCOUNTER — Other Ambulatory Visit (HOSPITAL_COMMUNITY): Payer: Self-pay | Admitting: Orthopedic Surgery

## 2014-01-15 ENCOUNTER — Encounter (HOSPITAL_COMMUNITY): Payer: Managed Care, Other (non HMO) | Admitting: Certified Registered"

## 2014-01-15 ENCOUNTER — Encounter (HOSPITAL_COMMUNITY)
Admission: EM | Disposition: A | Discharge status: Home Health | Payer: Self-pay | Source: Home / Self Care | Attending: Internal Medicine

## 2014-01-15 ENCOUNTER — Encounter (HOSPITAL_COMMUNITY): Payer: Self-pay | Admitting: Certified Registered"

## 2014-01-15 HISTORY — PX: TRANSMETATARSAL AMPUTATION: SHX6197

## 2014-01-15 LAB — GLUCOSE, CAPILLARY
GLUCOSE-CAPILLARY: 139 mg/dL — AB (ref 70–99)
Glucose-Capillary: 153 mg/dL — ABNORMAL HIGH (ref 70–99)
Glucose-Capillary: 129 mg/dL — ABNORMAL HIGH (ref 70–99)
Glucose-Capillary: 124 mg/dL — ABNORMAL HIGH (ref 70–99)
Glucose-Capillary: 133 mg/dL — ABNORMAL HIGH (ref 70–99)
Glucose-Capillary: 156 mg/dL — ABNORMAL HIGH (ref 70–99)
Glucose-Capillary: 202 mg/dL — ABNORMAL HIGH (ref 70–99)

## 2014-01-15 LAB — CBC
HEMATOCRIT: 30.3 % — AB (ref 39.0–52.0)
Hemoglobin: 10 g/dL — ABNORMAL LOW (ref 13.0–17.0)
MCH: 26.5 pg (ref 26.0–34.0)
MCHC: 33 g/dL (ref 30.0–36.0)
MCV: 80.4 fL (ref 78.0–100.0)
Platelets: 386 10*3/uL (ref 150–400)
RBC: 3.77 MIL/uL — ABNORMAL LOW (ref 4.22–5.81)
RDW: 15.2 % (ref 11.5–15.5)
WBC: 15.6 10*3/uL — ABNORMAL HIGH (ref 4.0–10.5)

## 2014-01-15 LAB — BASIC METABOLIC PANEL
BUN: 7 mg/dL (ref 6–23)
CHLORIDE: 100 meq/L (ref 96–112)
CO2: 26 mEq/L (ref 19–32)
CREATININE: 1.41 mg/dL — AB (ref 0.50–1.35)
Calcium: 8.6 mg/dL (ref 8.4–10.5)
GFR, EST AFRICAN AMERICAN: 68 mL/min — AB (ref >90)
GFR, EST NON AFRICAN AMERICAN: 59 mL/min — AB (ref >90)
Glucose, Bld: 162 mg/dL — ABNORMAL HIGH (ref 70–99)
Potassium: 3.9 mEq/L (ref 3.7–5.3)
Sodium: 135 mEq/L — ABNORMAL LOW (ref 137–147)

## 2014-01-15 LAB — MAGNESIUM: MAGNESIUM: 2.2 mg/dL (ref 1.5–2.5)

## 2014-01-15 LAB — SURGICAL PCR SCREEN
MRSA, PCR: NEGATIVE
Staphylococcus aureus: NEGATIVE

## 2014-01-15 SURGERY — AMPUTATION, FOOT, TRANSMETATARSAL
Anesthesia: Monitor Anesthesia Care | Site: Foot | Laterality: Right

## 2014-01-15 MED ORDER — ONDANSETRON HCL 4 MG/2ML IJ SOLN
INTRAMUSCULAR | Status: AC
  Filled 2014-01-15: qty 2

## 2014-01-15 MED ORDER — METOCLOPRAMIDE HCL 5 MG PO TABS: 5.0000 mg | ORAL_TABLET | Freq: Three times a day (TID) | ORAL | Status: DC | PRN

## 2014-01-15 MED ORDER — CLONIDINE HCL 0.3 MG PO TABS
0.3000 mg | ORAL_TABLET | Freq: Three times a day (TID) | ORAL | Status: DC
  Administered 2014-01-15 – 2014-01-19 (×13): 0.3 mg via ORAL
  Filled 2014-01-15 (×15): qty 1

## 2014-01-15 MED ORDER — LIDOCAINE HCL (CARDIAC) 20 MG/ML IV SOLN
INTRAVENOUS | Status: DC | PRN
  Administered 2014-01-15: 40 mg via INTRAVENOUS

## 2014-01-15 MED ORDER — OXYCODONE-ACETAMINOPHEN 5-325 MG PO TABS: 1.0000 | ORAL_TABLET | ORAL | Status: DC | PRN

## 2014-01-15 MED ORDER — WARFARIN SODIUM 10 MG PO TABS
10.0000 mg | ORAL_TABLET | Freq: Once | ORAL | Status: AC
  Administered 2014-01-15: 10 mg via ORAL
  Filled 2014-01-15: qty 1

## 2014-01-15 MED ORDER — METOCLOPRAMIDE HCL 5 MG/ML IJ SOLN: 5.0000 mg | Freq: Three times a day (TID) | INTRAMUSCULAR | Status: DC | PRN

## 2014-01-15 MED ORDER — ACETAMINOPHEN 160 MG/5ML PO SOLN: 325.0000 mg | ORAL | Status: DC | PRN

## 2014-01-15 MED ORDER — ONDANSETRON HCL 4 MG PO TABS: 4.0000 mg | ORAL_TABLET | Freq: Four times a day (QID) | ORAL | Status: DC | PRN

## 2014-01-15 MED ORDER — HYDROMORPHONE HCL PF 1 MG/ML IJ SOLN
0.5000 mg | INTRAMUSCULAR | Status: DC | PRN
  Filled 2014-01-15: qty 1

## 2014-01-15 MED ORDER — MIDAZOLAM HCL 2 MG/2ML IJ SOLN
INTRAMUSCULAR | Status: AC
  Filled 2014-01-15: qty 2

## 2014-01-15 MED ORDER — FENTANYL CITRATE 0.05 MG/ML IJ SOLN
INTRAMUSCULAR | Status: AC
  Filled 2014-01-15: qty 5

## 2014-01-15 MED ORDER — HEPARIN SODIUM (PORCINE) 5000 UNIT/ML IJ SOLN
5000.0000 [IU] | Freq: Three times a day (TID) | INTRAMUSCULAR | Status: DC
  Administered 2014-01-15 – 2014-01-19 (×11): 5000 [IU] via SUBCUTANEOUS
  Filled 2014-01-15 (×15): qty 1

## 2014-01-15 MED ORDER — ONDANSETRON HCL 4 MG/2ML IJ SOLN: 4.0000 mg | Freq: Four times a day (QID) | INTRAMUSCULAR | Status: DC | PRN

## 2014-01-15 MED ORDER — LACTATED RINGERS IV SOLN: INTRAVENOUS | Status: DC

## 2014-01-15 MED ORDER — ROPIVACAINE HCL 5 MG/ML IJ SOLN
INTRAMUSCULAR | Status: DC | PRN
  Administered 2014-01-15: 30 mL via PERINEURAL

## 2014-01-15 MED ORDER — PROPOFOL 10 MG/ML IV BOLUS
INTRAVENOUS | Status: AC
  Filled 2014-01-15: qty 20

## 2014-01-15 MED ORDER — CLONIDINE HCL 0.2 MG PO TABS
0.2000 mg | ORAL_TABLET | Freq: Three times a day (TID) | ORAL | Status: DC
  Administered 2014-01-15: 0.2 mg via ORAL
  Filled 2014-01-15 (×3): qty 1

## 2014-01-15 MED ORDER — PROPOFOL 10 MG/ML IV BOLUS
INTRAVENOUS | Status: DC | PRN
  Administered 2014-01-15: 200 mg via INTRAVENOUS

## 2014-01-15 MED ORDER — ONDANSETRON HCL 4 MG/2ML IJ SOLN: 4.0000 mg | Freq: Once | INTRAMUSCULAR | Status: DC | PRN

## 2014-01-15 MED ORDER — METOPROLOL TARTRATE 50 MG PO TABS
50.0000 mg | ORAL_TABLET | Freq: Two times a day (BID) | ORAL | Status: DC
  Administered 2014-01-15 – 2014-01-16 (×3): 50 mg via ORAL
  Filled 2014-01-15 (×4): qty 1

## 2014-01-15 MED ORDER — ONDANSETRON HCL 4 MG/2ML IJ SOLN
INTRAMUSCULAR | Status: DC | PRN
  Administered 2014-01-15: 4 mg via INTRAVENOUS

## 2014-01-15 MED ORDER — LACTATED RINGERS IV SOLN
INTRAVENOUS | Status: DC
  Administered 2014-01-15: 13:00:00 via INTRAVENOUS

## 2014-01-15 MED ORDER — MIDAZOLAM HCL 5 MG/5ML IJ SOLN
INTRAMUSCULAR | Status: DC | PRN
  Administered 2014-01-15: 2 mg via INTRAVENOUS

## 2014-01-15 MED ORDER — FENTANYL CITRATE 0.05 MG/ML IJ SOLN
INTRAMUSCULAR | Status: DC | PRN
  Administered 2014-01-15: 50 ug via INTRAVENOUS
  Administered 2014-01-15: 100 ug via INTRAVENOUS

## 2014-01-15 MED ORDER — WARFARIN - PHARMACIST DOSING INPATIENT
Freq: Every day | Status: DC
  Administered 2014-01-18: 18:00:00

## 2014-01-15 MED ORDER — 0.9 % SODIUM CHLORIDE (POUR BTL) OPTIME
TOPICAL | Status: DC | PRN
  Administered 2014-01-15: 1000 mL

## 2014-01-15 MED ORDER — FENTANYL CITRATE 0.05 MG/ML IJ SOLN
25.0000 ug | INTRAMUSCULAR | Status: DC | PRN
Start: 2014-01-15 — End: 2014-01-15

## 2014-01-15 MED ORDER — HYDRALAZINE HCL 50 MG PO TABS
100.0000 mg | ORAL_TABLET | Freq: Three times a day (TID) | ORAL | Status: DC
  Administered 2014-01-15 – 2014-01-19 (×12): 100 mg via ORAL
  Filled 2014-01-15 (×16): qty 2

## 2014-01-15 MED ORDER — SODIUM CHLORIDE 0.9 % IV SOLN: INTRAVENOUS | Status: DC

## 2014-01-15 MED ORDER — PHENYLEPHRINE 40 MCG/ML (10ML) SYRINGE FOR IV PUSH (FOR BLOOD PRESSURE SUPPORT)
PREFILLED_SYRINGE | INTRAVENOUS | Status: AC
  Filled 2014-01-15: qty 10

## 2014-01-15 MED ORDER — ACETAMINOPHEN 325 MG PO TABS: 325.0000 mg | ORAL_TABLET | ORAL | Status: DC | PRN

## 2014-01-15 SURGICAL SUPPLY — 48 items
BLADE SAW SAG 73X25 THK (BLADE) ×2
BLADE SAW SGTL 73X25 THK (BLADE) ×1 IMPLANT
BLADE SURG 10 STRL SS (BLADE) ×3 IMPLANT
BNDG COHESIVE 4X5 TAN STRL (GAUZE/BANDAGES/DRESSINGS) ×3 IMPLANT
BNDG GAUZE ELAST 4 BULKY (GAUZE/BANDAGES/DRESSINGS) ×3 IMPLANT
COVER SURGICAL LIGHT HANDLE (MISCELLANEOUS) ×4 IMPLANT
CUFF TOURNIQUET SINGLE 18IN (TOURNIQUET CUFF) IMPLANT
CUFF TOURNIQUET SINGLE 24IN (TOURNIQUET CUFF) IMPLANT
CUFF TOURNIQUET SINGLE 34IN LL (TOURNIQUET CUFF) IMPLANT
CUFF TOURNIQUET SINGLE 44IN (TOURNIQUET CUFF) IMPLANT
DRAIN PENROSE 1/4X12 LTX STRL (WOUND CARE) ×3 IMPLANT
DRAPE U-SHAPE 47X51 STRL (DRAPES) ×4 IMPLANT
DRSG ADAPTIC 3X8 NADH LF (GAUZE/BANDAGES/DRESSINGS) ×4 IMPLANT
DRSG PAD ABDOMINAL 8X10 ST (GAUZE/BANDAGES/DRESSINGS) ×3 IMPLANT
DURAPREP 26ML APPLICATOR (WOUND CARE) ×1 IMPLANT
ELECT CAUTERY BLADE 6.4 (BLADE) IMPLANT
ELECT REM PT RETURN 9FT ADLT (ELECTROSURGICAL) ×4
ELECTRODE REM PT RTRN 9FT ADLT (ELECTROSURGICAL) ×1 IMPLANT
GLOVE BIOGEL PI IND STRL 7.0 (GLOVE) ×1 IMPLANT
GLOVE BIOGEL PI IND STRL 9 (GLOVE) ×2 IMPLANT
GLOVE BIOGEL PI INDICATOR 7.0 (GLOVE) ×2
GLOVE BIOGEL PI INDICATOR 9 (GLOVE) ×2
GLOVE SURG ORTHO 9.0 STRL STRW (GLOVE) ×4 IMPLANT
GLOVE SURG SS PI 6.5 STRL IVOR (GLOVE) ×3 IMPLANT
GOWN PREVENTION PLUS XLARGE (GOWN DISPOSABLE) ×4 IMPLANT
GOWN SRG XL XLNG 56XLVL 4 (GOWN DISPOSABLE) ×2 IMPLANT
GOWN STRL NON-REIN XL XLG LVL4 (GOWN DISPOSABLE) ×4
HANDPIECE INTERPULSE COAX TIP (DISPOSABLE)
KIT BASIN OR (CUSTOM PROCEDURE TRAY) ×4 IMPLANT
KIT ROOM TURNOVER OR (KITS) ×4 IMPLANT
MANIFOLD NEPTUNE II (INSTRUMENTS) ×1 IMPLANT
NS IRRIG 1000ML POUR BTL (IV SOLUTION) ×4 IMPLANT
PACK ORTHO EXTREMITY (CUSTOM PROCEDURE TRAY) ×4 IMPLANT
PAD ARMBOARD 7.5X6 YLW CONV (MISCELLANEOUS) ×8 IMPLANT
PADDING CAST COTTON 6X4 STRL (CAST SUPPLIES) ×4 IMPLANT
SET HNDPC FAN SPRY TIP SCT (DISPOSABLE) IMPLANT
SPONGE GAUZE 4X4 12PLY (GAUZE/BANDAGES/DRESSINGS) ×4 IMPLANT
SPONGE LAP 18X18 X RAY DECT (DISPOSABLE) ×7 IMPLANT
STOCKINETTE IMPERVIOUS 9X36 MD (GAUZE/BANDAGES/DRESSINGS) IMPLANT
SUT ETHILON 2 0 PSLX (SUTURE) ×6 IMPLANT
TOWEL OR 17X24 6PK STRL BLUE (TOWEL DISPOSABLE) ×4 IMPLANT
TOWEL OR 17X26 10 PK STRL BLUE (TOWEL DISPOSABLE) ×4 IMPLANT
TUBE ANAEROBIC SPECIMEN COL (MISCELLANEOUS) IMPLANT
TUBE CONNECTING 12'X1/4 (SUCTIONS) ×1
TUBE CONNECTING 12X1/4 (SUCTIONS) ×3 IMPLANT
UNDERPAD 30X30 INCONTINENT (UNDERPADS AND DIAPERS) ×4 IMPLANT
WATER STERILE IRR 1000ML POUR (IV SOLUTION) ×1 IMPLANT
YANKAUER SUCT BULB TIP NO VENT (SUCTIONS) ×4 IMPLANT

## 2014-01-15 NOTE — H&P (View-Only) (Signed)
Patient ID: [name] ChanceReginald F [name], male   DOB: Sep 13, 1966, 48 y.o.   MRN: [id number] Dressing clean and dry plan for midfoot amputation on Friday afternoon.

## 2014-01-15 NOTE — Interval H&P Note (Signed)
History and Physical Interval Note:  01/15/2014 6:29 AM  [name] [name]  has presented today for surgery, with the diagnosis of Abscess, Osteomyelitis Right Foot  The various methods of treatment have been discussed with the patient and family. After consideration of risks, benefits and other options for treatment, the patient has consented to  Procedure(s) with comments: IRRIGATION AND DEBRIDEMENT EXTREMITY (Right) - Right Foot Irrigation and Debridement, Possible Midfoot Amputation as a surgical intervention .  The patient's history has been reviewed, patient examined, no change in status, stable for surgery.  I have reviewed the patient's chart and labs.  Questions were answered to the patient's satisfaction.     Shanicka Oldenkamp V

## 2014-01-15 NOTE — Progress Notes (Signed)
[facility] PROGRESS NOTE  [name] [name]:[id number] DOB: 1966-09-20 DOA: 01/08/2014 PCP: [name], MD  HPI/Subjective: [name] is a 48yo man with PMH of DM2 and HTN who presents with right foot drainage, swelling. MRI showed sub-cutaneous gas but no indication of osteomyelitis. He is s/p debridement and irrigation of the right foot; done on 2/7.   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 Orvan Falconer) for antibiotic guidance.  Clinda d/c'd 2/12 -Hydrotherapy ordered by Dr. Lajoyce Corners.  Continue Tylenol/Hydrocodone for pain management  -to the OR 2/13 with Dr. Lajoyce Corners for debridement and possible mid foot amputation.    H/O Malignant HTN  -BP slowly improving.  Increased hydralazine 2/12.  Consider adding diuretic at d/c -Was attempting to wean off clonidine, but given uncontrolled BP will resume 0.2 mg bid -BP still not well controlled 2/13 am. Creatinine is rising.  D/C lisinopril.  Increase clonidine to 0.3 tid, increase hydralazine to 100 mg TID, still on norvasc and metoprolol.   DM2  -Hgb A1C: 12.5 on admission; was on Metformin and Invokana at home -increased to Novolog 8 units meal coverage and levemir 22 units qhs.  CBGs much better controlled. -He will be discharged on insulin. Will need levemir and novolog flex pens and Rx for meter.   Acute rise in Cr, AKI  -on 2/7, Cr of 1.53 secondary to acute infection. -resolved and back to baseline on 2/11 -Creatinine elevated again 2/13.  Uncertain etiology - possibly vancomycin.   -Will d/c lisinopril once again and increase clonidine to 0.3 TID. -change from lovenox to heparin.   Mild Hypokalemia  -Repleted.  Continue to replete as needed. -mag is  2.6 -Will monitor with BMET in am    Mild Hyponatremia  -Resolved with IVF -Will monitor with BMET in am    DVT Prophylaxis:  SCDs Heparin. Code Status: Full  Family Communication:  Disposition Plan: Remain inpatient   Consultants:  Orthopaedics (Drs Rennis Chris and Lajoyce Corners)  Procedures:  Irrigation and debridement extremity right  Antibiotics: Anti-infectives   Start     Dose/Rate Route Frequency Ordered Stop   01/14/14 1300  vancomycin (VANCOCIN) IVPB 1000 mg/200 mL premix     1,000 mg 200 mL/hr over 60 Minutes Intravenous Every 8 hours 01/14/14 0808     01/12/14 2100  vancomycin (VANCOCIN) 1,250 mg in sodium chloride 0.9 % 250 mL IVPB  Status:  Discontinued     1,250 mg 166.7 mL/hr over 90 Minutes Intravenous Every 8 hours 01/12/14 1322 01/14/14 0808   01/09/14 0000  vancomycin (VANCOCIN) 1,500 mg in sodium chloride 0.9 % 500 mL IVPB  Status:  Discontinued     1,500 mg 250 mL/hr over 120 Minutes Intravenous Every 12 hours 01/08/14 1236 01/12/14 1322   01/08/14 2100  piperacillin-tazobactam (ZOSYN) IVPB 3.375 g     3.375 g 12.5 mL/hr over 240 Minutes Intravenous 3 times per day 01/08/14 1526     01/08/14 1530  clindamycin (CLEOCIN) IVPB 600 mg  Status:  Discontinued     600 mg 100 mL/hr over 30 Minutes Intravenous 3 times per day 01/08/14 1514 01/13/14 1652   01/08/14 1230  vancomycin (VANCOCIN) IVPB 1000 mg/200 mL premix  Status:  Discontinued     1,000 mg  200 mL/hr over 60 Minutes Intravenous  Once 01/08/14 1103 01/08/14 1514   01/08/14 1200  piperacillin-tazobactam (ZOSYN) IVPB 3.375 g     3.375 g 12.5 mL/hr over 240 Minutes Intravenous  Once 01/08/14 1152 01/08/14 1627   01/08/14 1200  clindamycin (CLEOCIN) IVPB 600 mg     600 mg 100 mL/hr over 30 Minutes Intravenous  Once 01/08/14 1152 01/08/14 1257   01/08/14 1130  vancomycin (VANCOCIN) IVPB 1000 mg/200 mL premix     1,000 mg 200 mL/hr over 60 Minutes Intravenous  Once 01/08/14 1103 01/08/14 1227      Objective: Filed Vitals:   01/14/14 2059 01/14/14 2215 01/15/14 0523 01/15/14 0935  BP: 168/83 157/75 172/82 198/101  Pulse:  79 71   Temp:   98.8 F (37.1 C)   TempSrc:   Oral   Resp:   18   Height:      Weight:      SpO2:   100%     Intake/Output Summary (Last 24 hours) at 01/15/14 1257 Last data filed at 01/15/14 0405  Gross per 24 hour  Intake    250 ml  Output    700 ml  Net   -450 ml   Filed Weights   01/08/14 1422  Weight: 159.984 kg (352 lb 11.2 oz)   Exam: General: Obese, NAD, appears stated age. Lying in bed.  Smiling today. Cardiovascular: RRR, S1 S2 auscultated, no rubs, murmurs or gallops.   Respiratory: Clear to auscultation bilaterally with equal chest rise  Abdomen: massive, Soft, nontender, nondistended, + bowel sounds  Extremities: L foot in bandage; lower L leg warm and tender to palpation with mild erythema  Neuro: AAOx3 Psych:  pleasant, cooperative, A&O  Data Reviewed: Basic Metabolic Panel:  Recent Labs Lab 01/10/14 0700 01/11/14 0945 01/12/14 0624 01/12/14 1230 01/13/14 0501 01/15/14 0608  NA 135* 135* 139  --  140 135*  K 4.0 3.7 3.5*  --  3.8 3.9  CL 97 98 102  --  104 100  CO2 20 22 24   --  23 26  GLUCOSE 222* 251* 157*  --  149* 162*  BUN 32* 23 13  --  9 7  CREATININE 1.39* 1.13 1.00  --  1.04 1.41*  CALCIUM 8.4 8.6 8.4  --  8.5 8.6  MG  --   --   --  2.6*  --  2.2  Liver Function Tests:  Recent Labs Lab 01/09/14 0546  AST 12  ALT 11  ALKPHOS 103  BILITOT 0.5  PROT 7.1  ALBUMIN 1.9*  CBC:  Recent Labs Lab 01/10/14 0700 01/11/14 0945 01/12/14 0624 01/13/14 0501 01/15/14 0608  WBC 33.6* 28.1* 22.6* 20.0* 15.6*  HGB 10.6* 11.0* 10.6* 10.2* 10.0*  HCT 31.2* 32.3* 31.8* 31.1* 30.3*  MCV 79.0 79.0 78.9 79.5 80.4  PLT 355 390 392 426* 386  CBG:  Recent Labs Lab 01/14/14 1232 01/14/14 1810 01/14/14 2126 01/15/14 0746 01/15/14 1148  GLUCAP 129* 153* 153* 153* 129*    Recent Results (from the past 240  hour(s))  CULTURE, BLOOD (ROUTINE X 2)     Status: None   Collection Time    01/08/14 12:20 PM      Result Value Ref Range Status   Specimen Description BLOOD LEFT FOREARM   Final   Special Requests BOTTLES DRAWN AEROBIC ONLY [facility] EACH   Final   Culture  Setup Time     Final   Value: 01/08/2014 14:10  Performed at [facility]   Culture     Final   Value: NO GROWTH 5 DAYS     Performed at [facility]   Report Status 01/14/2014 FINAL   Final  MRSA PCR SCREENING     Status: None   Collection Time    01/09/14  7:42 AM      Result Value Ref Range Status   MRSA by PCR NEGATIVE  NEGATIVE Final   Comment:            The GeneXpert MRSA Assay (FDA     approved for NASAL specimens     only), is one component of a     comprehensive MRSA colonization     surveillance program. It is not     intended to diagnose MRSA     infection nor to guide or     monitor treatment for     MRSA infections.  WOUND CULTURE     Status: None   Collection Time    01/09/14 10:35 AM      Result Value Ref Range Status   Specimen Description WOUND LEFT FOOT   Final   Special Requests NONE   Final   Gram Stain     Final   Value: RARE WBC PRESENT, PREDOMINANTLY PMN     NO SQUAMOUS EPITHELIAL CELLS SEEN     MODERATE GRAM POSITIVE COCCI IN PAIRS     Performed at [facility]   Culture     Final   Value: NO GROWTH 2 DAYS     Performed at [facility]   Report Status 01/11/2014 FINAL   Final  ANAEROBIC CULTURE     Status: None   Collection Time    01/09/14 10:35 AM      Result Value Ref Range Status   Specimen Description FOOT LEFT   Final   Special Requests NONE   Final   Gram Stain     Final   Value: NO WBC SEEN     NO SQUAMOUS EPITHELIAL CELLS SEEN     ABUNDANT GRAM POSITIVE COCCI IN PAIRS     MODERATE GRAM VARIABLE ROD     Performed at [facility]   Culture     Final   Value: NO ANAEROBES ISOLATED; CULTURE IN PROGRESS FOR 5 DAYS     Performed at [facility]   Report Status PENDING   Incomplete  MRSA PCR SCREENING     Status: None   Collection Time    01/12/14 12:14 AM      Result Value Ref Range Status   MRSA by PCR NEGATIVE  NEGATIVE Final   Comment:            The GeneXpert MRSA Assay (FDA     approved for NASAL specimens     only), is one component of a     comprehensive MRSA colonization     surveillance program. It is not     intended to diagnose MRSA     infection nor to guide or     monitor treatment for     MRSA infections.  SURGICAL PCR SCREEN     Status: None   Collection Time    01/12/14  3:03 AM      Result Value Ref Range Status   MRSA, PCR NEGATIVE  NEGATIVE Final   Staphylococcus aureus NEGATIVE  NEGATIVE Final   Comment:  The Xpert SA Assay (FDA     approved for NASAL specimens     in patients over 62 years of age),     is one component of     a comprehensive surveillance     program.  Test performance has     been validated by [employer] for patients greater     than or equal to 34 year old.     It is not intended     to diagnose infection nor to     guide or monitor treatment.    Studies: No results found.  Scheduled Meds: . amLODipine  10 mg Oral Daily  . antiseptic oral rinse  15 mL Mouth Rinse q12n4p  . chlorhexidine  15 mL Mouth Rinse BID  . cloNIDine  0.3 mg Oral TID  . docusate sodium  100 mg Oral BID  . heparin subcutaneous  5,000 Units Subcutaneous 3 times per day  . hydrALAZINE  100 mg Oral 3 times per day  . insulin aspart  0-15 Units Subcutaneous TID WC  . insulin aspart  8 Units Subcutaneous TID WC  . insulin detemir  22 Units Subcutaneous QHS  . metoprolol tartrate  50 mg Oral BID  . piperacillin-tazobactam (ZOSYN)  IV  3.375 g Intravenous 3 times per day  . vancomycin  1,000 mg Intravenous Q8H   Principal Problem:   Gas gangrene Active Problems:   Cellulitis and abscess   Malignant hypertension   Diabetes mellitus, type 2   Sepsis   Insulin  dependent type 2 diabetes mellitus, uncontrolled  Algis Downs, [place] [facility] Pager [phone]. If 7PM-7AM, please contact night-coverage at [email or web address], password [facility] 01/15/2014, 12:57 PM  LOS: 7 days   Attending Patient seen and examined, agree with the assessment and plan as outlined above. For OR today.  Windell Norfolk MD

## 2014-01-15 NOTE — Progress Notes (Signed)
Patient ID: [name] [name], male   DOB: Apr 25, 1966, 48 y.o.   MRN: [id number]         [facility]    Date of Admission:  01/08/2014    Total days of antibiotics 8         Principal Problem:   Gas gangrene Active Problems:   Cellulitis and abscess   Malignant hypertension   Diabetes mellitus, type 2   Sepsis   Insulin dependent type 2 diabetes mellitus, uncontrolled   . amLODipine  10 mg Oral Daily  . antiseptic oral rinse  15 mL Mouth Rinse q12n4p  . chlorhexidine  15 mL Mouth Rinse BID  . cloNIDine  0.2 mg Oral TID  . docusate sodium  100 mg Oral BID  . enoxaparin (LOVENOX) injection  40 mg Subcutaneous Q24H  . hydrALAZINE  100 mg Oral 3 times per day  . insulin aspart  0-15 Units Subcutaneous TID WC  . insulin aspart  8 Units Subcutaneous TID WC  . insulin detemir  22 Units Subcutaneous QHS  . lisinopril  40 mg Oral Daily  . metoprolol tartrate  50 mg Oral BID  . piperacillin-tazobactam (ZOSYN)  IV  3.375 g Intravenous 3 times per day  . vancomycin  1,000 mg Intravenous Q8H     Objective: Temp:  [98.4 F (36.9 C)-98.9 F (37.2 C)] 98.8 F (37.1 C) (02/13 0523) Pulse Rate:  [71-99] 71 (02/13 0523) Resp:  [17-18] 18 (02/13 0523) BP: (157-198)/(75-101) 198/101 mmHg (02/13 0935) SpO2:  [98 %-100 %] 100 % (02/13 0523)  Lab Results Lab Results  Component Value Date   WBC 15.6* 01/15/2014   HGB 10.0* 01/15/2014   HCT 30.3* 01/15/2014   MCV 80.4 01/15/2014   PLT 386 01/15/2014    Lab Results  Component Value Date   CREATININE 1.41* 01/15/2014   BUN 7 01/15/2014   NA 135* 01/15/2014   K 3.9 01/15/2014   CL 100 01/15/2014   CO2 26 01/15/2014    Lab Results  Component Value Date   ALT 11 01/09/2014   AST 12 01/09/2014   ALKPHOS 103 01/09/2014   BILITOT 0.5 01/09/2014      Microbiology: Recent Results (from the past 240 hour(s))  CULTURE, BLOOD (ROUTINE X 2)     Status: None   Collection Time    01/08/14 12:20 PM      Result Value Ref Range  Status   Specimen Description BLOOD LEFT FOREARM   Final   Special Requests BOTTLES DRAWN AEROBIC ONLY [facility] EACH   Final   Culture  Setup Time     Final   Value: 01/08/2014 14:10     Performed at [facility]   Culture     Final   Value: NO GROWTH 5 DAYS     Performed at [facility]   Report Status 01/14/2014 FINAL   Final  MRSA PCR SCREENING     Status: None   Collection Time    01/09/14  7:42 AM      Result Value Ref Range Status   MRSA by PCR NEGATIVE  NEGATIVE Final   Comment:            The GeneXpert MRSA Assay (FDA     approved for NASAL specimens     only), is one component of a     comprehensive MRSA colonization     surveillance program. It is not     intended to diagnose  MRSA     infection nor to guide or     monitor treatment for     MRSA infections.  WOUND CULTURE     Status: None   Collection Time    01/09/14 10:35 AM      Result Value Ref Range Status   Specimen Description WOUND LEFT FOOT   Final   Special Requests NONE   Final   Gram Stain     Final   Value: RARE WBC PRESENT, PREDOMINANTLY PMN     NO SQUAMOUS EPITHELIAL CELLS SEEN     MODERATE GRAM POSITIVE COCCI IN PAIRS     Performed at [facility]   Culture     Final   Value: NO GROWTH 2 DAYS     Performed at [facility]   Report Status 01/11/2014 FINAL   Final  ANAEROBIC CULTURE     Status: None   Collection Time    01/09/14 10:35 AM      Result Value Ref Range Status   Specimen Description FOOT LEFT   Final   Special Requests NONE   Final   Gram Stain     Final   Value: NO WBC SEEN     NO SQUAMOUS EPITHELIAL CELLS SEEN     ABUNDANT GRAM POSITIVE COCCI IN PAIRS     MODERATE GRAM VARIABLE ROD     Performed at [facility]   Culture     Final   Value: NO ANAEROBES ISOLATED; CULTURE IN PROGRESS FOR 5 DAYS     Performed at [facility]   Report Status PENDING   Incomplete  MRSA PCR SCREENING     Status: None   Collection Time    01/12/14 12:14  AM      Result Value Ref Range Status   MRSA by PCR NEGATIVE  NEGATIVE Final   Comment:            The GeneXpert MRSA Assay (FDA     approved for NASAL specimens     only), is one component of a     comprehensive MRSA colonization     surveillance program. It is not     intended to diagnose MRSA     infection nor to guide or     monitor treatment for     MRSA infections.  SURGICAL PCR SCREEN     Status: None   Collection Time    01/12/14  3:03 AM      Result Value Ref Range Status   MRSA, PCR NEGATIVE  NEGATIVE Final   Staphylococcus aureus NEGATIVE  NEGATIVE Final   Comment:            The Xpert SA Assay (FDA     approved for NASAL specimens     in patients over 43 years of age),     is one component of     a comprehensive surveillance     program.  Test performance has     been validated by [employer] for patients greater     than or equal to 83 year old.     It is not intended     to diagnose infection nor to     guide or monitor treatment.   Assessment: He has a severe polymicrobial foot infection and is scheduled for a transmetatarsal amputation today. I would continue current antibiotics postoperatively until discharge. He should not need a lengthy course of  postoperative antibiotic therapy.  Plan: 1. Continue vancomycin and piperacillin tazobactam for several days postop 2. I will sign off now but please call if I can be of further assistance while he is here  Cliffton Asters, MD [facility] for Infectious Disease [facility] [phone] pager   [phone] cell 01/15/2014, 11:35 AM

## 2014-01-15 NOTE — Progress Notes (Signed)
ANTICOAGULATION CONSULT NOTE - Initial Consult  Pharmacy Consult for warfarin Indication: VTE prophylaxis  Allergies  Allergen Reactions  . Minocycline Anaphylaxis  . Shellfish Allergy Nausea And Vomiting    Patient Measurements: Height: 6\' 3"  (190.5 cm) Weight: 352 lb 11.2 oz (159.984 kg) IBW/kg (Calculated) : 84.5   Vital Signs: Temp: 98.4 F (36.9 C) (02/13 1531) Temp src: Oral (02/13 1531) BP: 176/94 mmHg (02/13 1531) Pulse Rate: 69 (02/13 1531)  Labs:  Recent Labs  01/13/14 0501 01/15/14 0608  HGB 10.2* 10.0*  HCT 31.1* 30.3*  PLT 426* 386  CREATININE 1.04 1.41*    Estimated Creatinine Clearance: 107.3 ml/min (by C-G formula based on Cr of 1.41).   Medical History: Past Medical History  Diagnosis Date  . Hypertension   . Diabetes mellitus   . Diverticulitis   . 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 To start warfarin for VTE prophylaxis. Baseline INR 1.16. H/H low but relatively stable, plts wnl. No excessive bleeding noted post-op.  Goal of Therapy:  INR 2-3 Monitor platelets by anticoagulation protocol: Yes   Plan:  1. Warfarin 10mg  po x1 tonight 2. Daily PT/INR 3. Follow for s/s bleeding  Falen Lehrmann D. Kenden Brandt, PharmD, BCPS Clinical Pharmacist Pager: [phone] 01/15/2014 3:51 PM

## 2014-01-15 NOTE — Transfer of Care (Signed)
Immediate Anesthesia Transfer of Care Note  Patient: [name] [name]  Procedure(s) Performed: Procedure(s): TRANSMETATARSAL AMPUTATION (Right)  Patient Location: PACU  Anesthesia Type:General and Regional  Level of Consciousness: awake and alert   Airway & Oxygen Therapy: Patient Spontanous Breathing and Patient connected to nasal cannula oxygen  Post-op Assessment: Report given to PACU RN and Post -op Vital signs reviewed and stable  Post vital signs: Reviewed and stable  Complications: No apparent anesthesia complications

## 2014-01-15 NOTE — Progress Notes (Signed)
Patient had a large area of necrotic abscess tissue dorsally to the foot. This was an area of about 5 cm x 5 cm dorsal lateral to the foot. A Penrose drain was placed deep within this area. The tissue was viable after closure, I do have concern that patient may develop some ischemic skin changes to the dorsum of the skin flap. Would recommend continue IV antibiotics for at least 3 weeks due to the extensive soft tissue damage from the infection.

## 2014-01-15 NOTE — Anesthesia Preprocedure Evaluation (Addendum)
Anesthesia Evaluation  Patient identified by MRN, date of birth, ID band Patient awake    Reviewed: Allergy & Precautions, H&P , NPO status , Patient's Chart, lab work & pertinent test results  History of Anesthesia Complications Negative for: history of anesthetic complications  Airway Mallampati: I TM Distance: >3 FB Neck ROM: Full    Dental  (+) Teeth Intact   Pulmonary neg pulmonary ROS,          Cardiovascular Exercise Tolerance: Poor hypertension, Pt. on medications - angina- CAD, - Past MI and - CHF Rhythm:Regular Rate:Normal     Neuro/Psych negative neurological ROS  negative psych ROS   GI/Hepatic Neg liver ROS, GERD-  ,  Endo/Other  diabetes, Type 2, Oral Hypoglycemic Agents, Insulin Dependent  Renal/GU negative Renal ROS     Musculoskeletal   Abdominal (+) + obese,   Peds  Hematology  (+) anemia ,   Anesthesia Other Findings   Reproductive/Obstetrics                          Anesthesia Physical Anesthesia Plan  ASA: III  Anesthesia Plan: Regional and General   Post-op Pain Management:    Induction:   Airway Management Planned: LMA  Additional Equipment: None  Intra-op Plan:   Post-operative Plan: Extubation in OR  Informed Consent: I have reviewed the patients History and Physical, chart, labs and discussed the procedure including the risks, benefits and alternatives for the proposed anesthesia with the patient or authorized representative who has indicated his/her understanding and acceptance.   Dental advisory given  Plan Discussed with: Surgeon and CRNA  Anesthesia Plan Comments:        Anesthesia Quick Evaluation

## 2014-01-15 NOTE — Anesthesia Postprocedure Evaluation (Signed)
  Anesthesia Post-op Note  Patient: [name] [name]  Procedure(s) Performed: Procedure(s): TRANSMETATARSAL AMPUTATION (Right)  Patient Location: PACU  Anesthesia Type:General and Regional  Level of Consciousness: awake, alert  and oriented  Airway and Oxygen Therapy: Patient Spontanous Breathing  Post-op Pain: none  Post-op Assessment: Post-op Vital signs reviewed, Patient's Cardiovascular Status Stable, Respiratory Function Stable, Patent Airway, No signs of Nausea or vomiting and Pain level controlled  Post-op Vital Signs: Reviewed and stable  Complications: No apparent anesthesia complications

## 2014-01-15 NOTE — Plan of Care (Signed)
Problem: Food- and Nutrition-Related Knowledge Deficit (NB-1.1) Goal: Nutrition education Formal process to instruct or train a patient/client in a skill or to impart knowledge to help patients/clients voluntarily manage or modify food choices and eating behavior to maintain or improve health. Outcome: Completed/Met Date Met:  01/15/14  RD consulted for nutrition education regarding diabetes. Discussed pt during progression rounds. Pt was not following any diet restrictions PTA. Pt verbalized that he is now going home on insulin and is going to have to check his blood sugars. He states that the diabetic coordinator is planning to speak with him tomorrow about this. We reviewed his diet today, pt provided very limited information.    Lab Results  Component Value Date    HGBA1C 12.5* 01/08/2014    RD provided "Carbohydrate Counting for People with Diabetes" handout from the Academy of [employer] and Dietetics. Discussed different food groups and their effects on blood sugar, emphasizing carbohydrate-containing foods. Provided list of carbohydrates and recommended serving sizes of common foods.  Discussed importance of controlled and consistent carbohydrate intake throughout the day. Provided examples of ways to balance meals/snacks and encouraged intake of high-fiber, whole grain complex carbohydrates. Teach back method used.  Expect fair compliance.  Body mass index is 44.08 kg/(m^2). Pt meets criteria for Obese Class III based on current BMI.  Current diet order is Carbohydrate Modified Medium, patient is consuming approximately 75% of meals at this time. Labs and medications reviewed. No further nutrition interventions warranted at this time. RD contact information provided. If additional nutrition issues arise, please re-consult RD.  Inda Coke MS, RD, LDN Inpatient Registered Dietitian Pager: [phone] After-hours pager: [phone]

## 2014-01-15 NOTE — Progress Notes (Signed)
Dr. Maple HudsonMoser notified pt going to room

## 2014-01-15 NOTE — Op Note (Signed)
OPERATIVE REPORT  DATE OF SURGERY: 01/15/2014  PATIENT:  [name] [name],  48 y.o. male  PRE-OPERATIVE DIAGNOSIS:  Abscess, Osteomyelitis Right Foot  POST-OPERATIVE DIAGNOSIS:  Abscess, Osteomyelitis Right Foot  PROCEDURE:  Procedure(s): TRANSMETATARSAL AMPUTATION  SURGEON:  Surgeon(s): Nadara MustardMarcus V Tihanna Goodson, MD  ANESTHESIA:   regional and general  EBL:  min ML  SPECIMEN:  Source of Specimen:  Right forefoot  TOURNIQUET:  * No tourniquets in log *  PROCEDURE DETAILS: Patient is a 48 year old gentleman diabetic insensate neuropathy BMI greater than 40 presents status post irrigation debridement with ray resection. Patient is undergone hydrotherapy to his foot IV antibiotics and presents at this time for revision surgery. Risks and benefits were discussed including persistent infection nonhealing of the wound need for higher level amputation. Patient states he understands and wished to proceed at this time. Description of procedure patient brought to the operating room after a popliteal block patient underwent general anesthetic. After adequate levels and anesthesia were obtained patient's right lower extremity was prepped using Betadine paint and scrub and draped into a sterile field. Patient still had a large fishmouth open necrotic wound. A fishmouth incision was then made just proximal to his wound. Semination showed and have a significant amount of necrotic tissue and abscess over the dorsal aspect of the foot the skin was cut back to where it was healthy in the abscess and necrotic tissue were debrided from the dorsum of the foot this measured an area of about 5 x 5 cm. A oscillating saw was used to create a transmetatarsal amputation. Hemostasis was obtained. The incision was closed after irrigation debridement with normal saline a large Penrose drain was placed deep within the area of the abscess dorsally of the foot. The incision was closed using 2-0 nylon without any tension the skin. The  wound is covered with Adaptic orthopedic sponges AB dressing Kerlix and Coban. Patient was discharged to the PACU in stable condition.  PLAN OF CARE: Admit to inpatient   PATIENT DISPOSITION:  PACU - hemodynamically stable.   Nadara MustardUDA,Igor Bishop V, MD 01/15/2014 3:29 PM

## 2014-01-15 NOTE — Anesthesia Procedure Notes (Signed)
Anesthesia Regional Block:  Popliteal block  Pre-Anesthetic Checklist: ,, timeout performed, Correct Patient, Correct Site, Correct Laterality, Correct Procedure, Correct Position, site marked, Risks and benefits discussed,  Surgical consent,  Pre-op evaluation,  At surgeon's request and post-op pain management  Laterality: Right and Lower  Prep: chloraprep       Needles:  Injection technique: Single-shot  Needle Type: Echogenic Stimulator Needle          Additional Needles:  Procedures: ultrasound guided (picture in chart) Popliteal block Narrative:  Injection made incrementally with aspirations every 5 mL.  Performed by: Personally  Anesthesiologist: Maple HudsonMoser

## 2014-01-16 LAB — BASIC METABOLIC PANEL
BUN: 8 mg/dL (ref 6–23)
CO2: 24 mEq/L (ref 19–32)
Calcium: 8.4 mg/dL (ref 8.4–10.5)
Chloride: 101 mEq/L (ref 96–112)
Creatinine, Ser: 1.62 mg/dL — ABNORMAL HIGH (ref 0.50–1.35)
GFR calc Af Amer: 58 mL/min — ABNORMAL LOW (ref >90)
GFR calc non Af Amer: 50 mL/min — ABNORMAL LOW (ref >90)
Glucose, Bld: 219 mg/dL — ABNORMAL HIGH (ref 70–99)
Potassium: 4.2 mEq/L (ref 3.7–5.3)
Sodium: 135 mEq/L — ABNORMAL LOW (ref 137–147)

## 2014-01-16 LAB — PROTIME-INR
INR: 1.06 (ref 0.00–1.49)
PROTHROMBIN TIME: 13.6 s (ref 11.6–15.2)

## 2014-01-16 LAB — GLUCOSE, CAPILLARY
Glucose-Capillary: 192 mg/dL — ABNORMAL HIGH (ref 70–99)
Glucose-Capillary: 180 mg/dL — ABNORMAL HIGH (ref 70–99)
Glucose-Capillary: 98 mg/dL (ref 70–99)
Glucose-Capillary: 142 mg/dL — ABNORMAL HIGH (ref 70–99)

## 2014-01-16 LAB — CBC
HCT: 30.1 % — ABNORMAL LOW (ref 39.0–52.0)
Hemoglobin: 9.8 g/dL — ABNORMAL LOW (ref 13.0–17.0)
MCH: 26.1 pg (ref 26.0–34.0)
MCHC: 32.6 g/dL (ref 30.0–36.0)
MCV: 80.1 fL (ref 78.0–100.0)
Platelets: 357 10*3/uL (ref 150–400)
RBC: 3.76 MIL/uL — ABNORMAL LOW (ref 4.22–5.81)
RDW: 15.4 % (ref 11.5–15.5)
WBC: 16.5 10*3/uL — ABNORMAL HIGH (ref 4.0–10.5)

## 2014-01-16 LAB — ANAEROBIC CULTURE: Gram Stain: NONE SEEN

## 2014-01-16 LAB — VANCOMYCIN, TROUGH: Vancomycin Tr: 24.7 ug/mL — ABNORMAL HIGH (ref 10.0–20.0)

## 2014-01-16 MED ORDER — SODIUM CHLORIDE 0.9 % IV SOLN
INTRAVENOUS | Status: DC
  Administered 2014-01-16 – 2014-01-17 (×3): via INTRAVENOUS
  Administered 2014-01-18: 20 mL/h via INTRAVENOUS

## 2014-01-16 MED ORDER — WARFARIN SODIUM 10 MG PO TABS
10.0000 mg | ORAL_TABLET | Freq: Once | ORAL | Status: AC
  Administered 2014-01-16: 10 mg via ORAL
  Filled 2014-01-16: qty 1

## 2014-01-16 MED ORDER — METOPROLOL TARTRATE 100 MG PO TABS
100.0000 mg | ORAL_TABLET | Freq: Two times a day (BID) | ORAL | Status: DC
  Administered 2014-01-16: 100 mg via ORAL
  Filled 2014-01-16 (×3): qty 1

## 2014-01-16 NOTE — Progress Notes (Signed)
PATIENT DETAILS Name: [name] [name] Age: 48 y.o. Sex: male Date of Birth: [birth year] Admit Date: 01/08/2014 Admitting Physician Esperanza Sheets, MD [id number], Chrissie Noa, MD  Subjective: No major complaints.  Assessment/Plan: Necrotizing fasciitis of the right forefoot - Patient was admitted, empirically started on vancomycin clindamycin and Zosyn. MRI did confirm necrotizing infection. 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 - Per Dr. Lajoyce Corners, patient will need at least 3 weeks of IV antibiotics. Currently vancomycin on hold due to supratherapeutic levels, continue Zosyn. - Will defer to postop care to orthopedic  Uncontrolled hypertension - BP still uncontrolled-however trend seems to be getting slightly better - Increased Lopressor 200 mg twice a day, continue with clonidine, amlodipine and hydralazine.  Acute renal failure - Suspect likely secondary to supratherapeutic vancomycin levels. - Stop vancomycin, and continue to monitor - Clinically euvolemic. - If renal failure persists, will check UA, renal ultrasound.  Supratherapeutic vancomycin levels - Stopped vancomycin- recheck levels in a.m.  Type 2 diabetes - CBGs stable with level may 22 units, and 8 units of NovoLog with meals. Also on SSI. --Hgb A1C: 12.5 on admission; was on Metformin and Invokana at home  Hypokalemia - Resolved  Hyponatremia - Resolved  Disposition: Remain inpatient  DVT Prophylaxis: Prophylactic Heparin, warfarin started by orthopedics for DVT prophylaxis  Code Status: Full code  Family Communication Spouse at bedside during multiple occasions throughout this hospitalization  Procedures:  2/7-incision and debridement  2/13-right midfoot amputation  CONSULTS:  ID and orthopedic surgery  Time  spent 40 minutes-which includes 50% of the time with face-to-face with patient/ family and coordinating care related to the above assessment and plan.    MEDICATIONS: Scheduled Meds: . amLODipine  10 mg Oral Daily  . antiseptic oral rinse  15 mL Mouth Rinse q12n4p  . chlorhexidine  15 mL Mouth Rinse BID  . cloNIDine  0.3 mg Oral TID  . docusate sodium  100 mg Oral BID  . heparin subcutaneous  5,000 Units Subcutaneous 3 times per day  . hydrALAZINE  100 mg Oral 3 times per day  . insulin aspart  0-15 Units Subcutaneous TID WC  . insulin aspart  8 Units Subcutaneous TID WC  . insulin detemir  22 Units Subcutaneous QHS  . metoprolol tartrate  50 mg Oral BID  . piperacillin-tazobactam (ZOSYN)  IV  3.375 g Intravenous 3 times per day  . warfarin  10 mg Oral ONCE-1800  . Warfarin - Pharmacist Dosing Inpatient   Does not apply q1800   Continuous Infusions: . sodium chloride 100 mL/hr at 01/16/14 0952  . lactated ringers 50 mL/hr at 01/15/14 1700   PRN Meds:.acetaminophen, acetaminophen, hydrALAZINE, HYDROcodone-acetaminophen, HYDROmorphone (DILAUDID) injection, metoCLOPramide (REGLAN) injection, metoCLOPramide, ondansetron (ZOFRAN) IV, ondansetron, oxyCODONE-acetaminophen, polyethylene glycol  Antibiotics: Anti-infectives   Start     Dose/Rate Route Frequency Ordered Stop   01/14/14 1300  vancomycin (VANCOCIN) IVPB 1000 mg/200 mL premix  Status:  Discontinued     1,000 mg 200 mL/hr over 60 Minutes Intravenous Every 8 hours 01/14/14 0808 01/16/14 1047   01/12/14 2100  vancomycin (VANCOCIN) 1,250 mg in sodium chloride 0.9 % 250 mL IVPB  Status:  Discontinued     1,250 mg 166.7 mL/hr over 90 Minutes Intravenous Every 8 hours 01/12/14 1322 01/14/14 0808   01/09/14 0000  vancomycin (  VANCOCIN) 1,500 mg in sodium chloride 0.9 % 500 mL IVPB  Status:  Discontinued     1,500 mg 250 mL/hr over 120 Minutes Intravenous Every 12 hours 01/08/14 1236 01/12/14 1322   01/08/14 2100   piperacillin-tazobactam (ZOSYN) IVPB 3.375 g     3.375 g 12.5 mL/hr over 240 Minutes Intravenous 3 times per day 01/08/14 1526     01/08/14 1530  clindamycin (CLEOCIN) IVPB 600 mg  Status:  Discontinued     600 mg 100 mL/hr over 30 Minutes Intravenous 3 times per day 01/08/14 1514 01/13/14 1652   01/08/14 1230  vancomycin (VANCOCIN) IVPB 1000 mg/200 mL premix  Status:  Discontinued     1,000 mg 200 mL/hr over 60 Minutes Intravenous  Once 01/08/14 1103 01/08/14 1514   01/08/14 1200  piperacillin-tazobactam (ZOSYN) IVPB 3.375 g     3.375 g 12.5 mL/hr over 240 Minutes Intravenous  Once 01/08/14 1152 01/08/14 1627   01/08/14 1200  clindamycin (CLEOCIN) IVPB 600 mg     600 mg 100 mL/hr over 30 Minutes Intravenous  Once 01/08/14 1152 01/08/14 1257   01/08/14 1130  vancomycin (VANCOCIN) IVPB 1000 mg/200 mL premix     1,000 mg 200 mL/hr over 60 Minutes Intravenous  Once 01/08/14 1103 01/08/14 1227       PHYSICAL EXAM: Vital signs in last 24 hours: Filed Vitals:   01/16/14 0522 01/16/14 0950 01/16/14 1228 01/16/14 1459  BP: 167/84 171/85 175/95 167/77  Pulse: 76 75 70   Temp: 98.6 F (37 C)  99.1 F (37.3 C)   TempSrc: Oral  Oral   Resp: 18  18   Height:      Weight:      SpO2: 96%  96%     Weight change:  Filed Weights   01/08/14 1422  Weight: 159.984 kg (352 lb 11.2 oz)   Body mass index is 44.08 kg/(m^2).   Gen Exam: Awake and alert with clear speech.   Neck: Supple, No JVD.   Chest: B/L Clear.   CVS: S1 S2 Regular, no murmurs.  Abdomen: soft, BS +, non tender, non distended.  Extremities: no edema, lower extremities warm to touch. Right foot in bandage-did not open. Neurologic: Non Focal.   Skin: No Rash.      Intake/Output from previous day: No intake or output data in the 24 hours ending 01/16/14 1549   LAB RESULTS: CBC  Recent Labs Lab 01/11/14 0945 01/12/14 0624 01/13/14 0501 01/15/14 0608 01/16/14 0610  WBC 28.1* 22.6* 20.0* 15.6* 16.5*  HGB 11.0*  10.6* 10.2* 10.0* 9.8*  HCT 32.3* 31.8* 31.1* 30.3* 30.1*  PLT 390 392 426* 386 357  MCV 79.0 78.9 79.5 80.4 80.1  MCH 26.9 26.3 26.1 26.5 26.1  MCHC 34.1 33.3 32.8 33.0 32.6  RDW 14.7 14.6 15.1 15.2 15.4    Chemistries   Recent Labs Lab 01/11/14 0945 01/12/14 0624 01/12/14 1230 01/13/14 0501 01/15/14 0608 01/16/14 0610  NA 135* 139  --  140 135* 135*  K 3.7 3.5*  --  3.8 3.9 4.2  CL 98 102  --  104 100 101  CO2 22 24  --  23 26 24   GLUCOSE 251* 157*  --  149* 162* 219*  BUN 23 13  --  9 7 8   CREATININE 1.13 1.00  --  1.04 1.41* 1.62*  CALCIUM 8.6 8.4  --  8.5 8.6 8.4  MG  --   --  2.6*  --  2.2  --  CBG:  Recent Labs Lab 01/15/14 1535 01/15/14 1741 01/15/14 2135 01/16/14 0735 01/16/14 1225  GLUCAP 139* 156* 202* 192* 180*    GFR Estimated Creatinine Clearance: 93.4 ml/min (by C-G formula based on Cr of 1.62).  Coagulation profile  Recent Labs Lab 01/16/14 0610  INR 1.06    Cardiac Enzymes No results found for this basename: CK, CKMB, TROPONINI, MYOGLOBIN,  in the last 168 hours  No components found with this basename: POCBNP,  No results found for this basename: DDIMER,  in the last 72 hours No results found for this basename: HGBA1C,  in the last 72 hours No results found for this basename: CHOL, HDL, LDLCALC, TRIG, CHOLHDL, LDLDIRECT,  in the last 72 hours No results found for this basename: TSH, T4TOTAL, FREET3, T3FREE, THYROIDAB,  in the last 72 hours No results found for this basename: VITAMINB12, FOLATE, FERRITIN, TIBC, IRON, RETICCTPCT,  in the last 72 hours No results found for this basename: LIPASE, AMYLASE,  in the last 72 hours  Urine Studies No results found for this basename: UACOL, UAPR, USPG, UPH, UTP, UGL, UKET, UBIL, UHGB, UNIT, UROB, ULEU, UEPI, UWBC, URBC, UBAC, CAST, CRYS, UCOM, BILUA,  in the last 72 hours  MICROBIOLOGY: Recent Results (from the past 240 hour(s))  CULTURE, BLOOD (ROUTINE X 2)     Status: None   Collection  Time    01/08/14 12:20 PM      Result Value Ref Range Status   Specimen Description BLOOD LEFT FOREARM   Final   Special Requests BOTTLES DRAWN AEROBIC ONLY 5CC EACH   Final   Culture  Setup Time     Final   Value: 01/08/2014 14:10     Performed at [facility]   Culture     Final   Value: NO GROWTH 5 DAYS     Performed at [facility]   Report Status 01/14/2014 FINAL   Final  MRSA PCR SCREENING     Status: None   Collection Time    01/09/14  7:42 AM      Result Value Ref Range Status   MRSA by PCR NEGATIVE  NEGATIVE Final   Comment:            The GeneXpert MRSA Assay (FDA     approved for NASAL specimens     only), is one component of a     comprehensive MRSA colonization     surveillance program. It is not     intended to diagnose MRSA     infection nor to guide or     monitor treatment for     MRSA infections.  WOUND CULTURE     Status: None   Collection Time    01/09/14 10:35 AM      Result Value Ref Range Status   Specimen Description WOUND LEFT FOOT   Final   Special Requests NONE   Final   Gram Stain     Final   Value: RARE WBC PRESENT, PREDOMINANTLY PMN     NO SQUAMOUS EPITHELIAL CELLS SEEN     MODERATE GRAM POSITIVE COCCI IN PAIRS     Performed at [facility]   Culture     Final   Value: NO GROWTH 2 DAYS     Performed at [facility]   Report Status 01/11/2014 FINAL   Final  ANAEROBIC CULTURE     Status: None   Collection Time    01/09/14 10:35 AM  Result Value Ref Range Status   Specimen Description FOOT LEFT   Final   Special Requests NONE   Final   Gram Stain     Final   Value: NO WBC SEEN     NO SQUAMOUS EPITHELIAL CELLS SEEN     ABUNDANT GRAM POSITIVE COCCI IN PAIRS     MODERATE GRAM VARIABLE ROD     Performed at [facility]   Culture     Final   Value: BACTEROIDES THETAIOTAOMICRON     Note: BETA LACTAMASE POSITIVE     Performed at [facility]   Report Status 01/16/2014 FINAL   Final  MRSA  PCR SCREENING     Status: None   Collection Time    01/12/14 12:14 AM      Result Value Ref Range Status   MRSA by PCR NEGATIVE  NEGATIVE Final   Comment:            The GeneXpert MRSA Assay (FDA     approved for NASAL specimens     only), is one component of a     comprehensive MRSA colonization     surveillance program. It is not     intended to diagnose MRSA     infection nor to guide or     monitor treatment for     MRSA infections.  SURGICAL PCR SCREEN     Status: None   Collection Time    01/12/14  3:03 AM      Result Value Ref Range Status   MRSA, PCR NEGATIVE  NEGATIVE Final   Staphylococcus aureus NEGATIVE  NEGATIVE Final   Comment:            The Xpert SA Assay (FDA     approved for NASAL specimens     in patients over 32 years of age),     is one component of     a comprehensive surveillance     program.  Test performance has     been validated by [facility] for patients greater     than or equal to 5 year old.     It is not intended     to diagnose infection nor to     guide or monitor treatment.  SURGICAL PCR SCREEN     Status: None   Collection Time    01/15/14  9:58 AM      Result Value Ref Range Status   MRSA, PCR NEGATIVE  NEGATIVE Final   Staphylococcus aureus NEGATIVE  NEGATIVE Final   Comment:            The Xpert SA Assay (FDA     approved for NASAL specimens     in patients over 82 years of age),     is one component of     a comprehensive surveillance     program.  Test performance has     been validated by [facility] for patients greater     than or equal to 39 year old.     It is not intended     to diagnose infection nor to     guide or monitor treatment.    RADIOLOGY STUDIES/RESULTS: Mr Foot Right W Wo Contrast  01/09/2014   CLINICAL DATA:  Diabetic with great toe swelling, erythema and drainage. Soft tissue emphysema on radiographs.  EXAM: MRI OF THE RIGHT FOREFOOT WITHOUT AND WITH CONTRAST  TECHNIQUE: Multiplanar,  multisequence MR imaging was performed both before and after administration of intravenous contrast.  CONTRAST:  20mL MULTIHANCE GADOBENATE DIMEGLUMINE 529 MG/ML IV SOLN  COMPARISON:  DG FOOT COMPLETE*R* dated 01/08/2014  FINDINGS: There is extensive susceptibility artifact within the plantar aspect of the first web space corresponding with the extensive soft tissue emphysema on the radiographs. There is extension to the skin plantar to the first metatarsal phalangeal joint. There is also dorsal extension into the great toe and along the extensor tendon dorsal to the second metatarsal. There is soft tissue edema and heterogeneous enhancement throughout the forefoot. No focal fluid collections are identified.  There is a small effusion of the first metatarsal phalangeal joint. This joint demonstrates low level synovial enhancement following contrast. There is no evidence of intra-articular air.  The toes and visualized metatarsals demonstrate no suspicious marrow edema, enhancement or cortical destruction to suggest osteomyelitis.  IMPRESSION: 1. As demonstrated radiographically, there is extensive soft tissue emphysema within the forefoot medially, suspicious for necrotizing infection. 2. No drainable abscess identified. 3. No evidence of osteomyelitis. Mild synovial enhancement of the first metatarsal phalangeal joint is noted, potentially due to intra-articular spread of infection. However, there is only a small associated effusion.   Electronically Signed   By: Roxy Horseman M.D.   On: 01/09/2014 08:21   Dg Foot Complete Right  01/08/2014   CLINICAL DATA:  Prior history of soft tissue trauma.  EXAM: RIGHT FOOT COMPLETE - 3+ VIEW  COMPARISON:  None.  FINDINGS: Areas of subcutaneous emphysema project within the soft tissues between the distal aspect of the first and second digit as well as adjacent to the base of the distal phalanx and along the proximal phalanx of the first digit. No definite cortical destruction  is appreciated. Considering the patient's history and extensive emphysematous changes surgical consultation, immediate is recommended. A necrotizing organism within the soft tissues cannot be excluded. There is otherwise no evidence of fracture nor dislocation.  IMPRESSION: Areas of subcutaneous emphysema within the soft tissues adjacent to the first and second digits as well as along the medial aspect of the first digit. Considering the patient's history and the radiographic findings immediate surgical consultation is recommended in etiologies such is a necrotizing organism cannot be excluded. There does not appear to be definite cortical destruction of the bone though if clinically warranted further evaluation with contrasted MRI is recommended These results were called by telephone at the time of interpretation on 01/08/2014 at 11:47 AM to Dr. Gwyneth Sprout , who verbally acknowledged these results.   Electronically Signed   By: Salome Holmes M.D.   On: 01/08/2014 11:48    Jeoffrey Massed, MD  [facility] Pager:[phone]  If 7PM-7AM, please contact night-coverage [email or web address] Password [facility] 01/16/2014, 3:49 PM   LOS: 8 days

## 2014-01-16 NOTE — Evaluation (Signed)
Physical Therapy Evaluation Patient Details Name: [name] [name] MRN: [id number] DOB: 1966/03/13 Today's Date: 01/16/2014 Time: [id number] PT Time Calculation (min): 27 min  PT Assessment / Plan / Recommendation History of Present Illness  Patient is a 48 y/o male admitted with right foot drainage x 1 month, now s/p transmetatarsal amputation 01/15/14.  Clinical Impression  Patient presents with decreased independence with mobility due to deficits listed below.  He will benefit from skilled PT in the acute setting to allow return home with wife assist and HHPT.  Will attempt with knee scooter tomorrow to determine if can use safely due to difficulty with NWB with rolling walker.  Also recommend hospital bed for ground level due to bedrooms upstairs.    PT Assessment  Patient needs continued PT services    Follow Up Recommendations  Home health PT    Does the patient have the potential to tolerate intense rehabilitation    N/A  Barriers to Discharge  Inaccessible home environment      Equipment Recommendations  Other (comment) (may need knee scooter, will try with pt 2/15)    Recommendations for Other Services   None  Frequency Min 5X/week    Precautions / Restrictions Precautions Precautions: Fall Restrictions Weight Bearing Restrictions: Yes RLE Weight Bearing: Non weight bearing   Pertinent Vitals/Pain No pain complaints      Mobility  Bed Mobility Overal bed mobility: Needs Assistance Bed Mobility: Supine to Sit;Sit to Supine Supine to sit: Supervision Sit to supine: Supervision General bed mobility comments: for safety due to IV, big man moving quickly Transfers Overall transfer level: Needs assistance Equipment used: Rolling walker (2 wheeled) Transfers: Sit to/from Stand Sit to Stand: Min assist General transfer comment: stood under his own power with assist for balance, did not maintain NWB with transfer Ambulation/Gait Ambulation/Gait assistance: Min  assist Ambulation Distance (Feet): 25 Feet (x 2) Assistive device: Rolling walker (2 wheeled) Gait Pattern/deviations: Step-to pattern General Gait Details: first walk to door and back pt putting weight on right foot despite multiple cues to lift foot and keep NWB, second try did keep foot up, but unsafe and trying to slide left foot rather than hop, put right foot down to turn        PT Diagnosis: Difficulty walking  PT Problem List: Decreased mobility;Decreased safety awareness;Decreased knowledge of precautions;Decreased knowledge of use of DME;Decreased balance;Decreased activity tolerance PT Treatment Interventions: Gait training;DME instruction;Balance training;Functional mobility training;Therapeutic activities;Patient/family education     PT Goals(Current goals can be found in the care plan section) Acute Rehab PT Goals Patient Stated Goal: To heal foot PT Goal Formulation: With patient/family Time For Goal Achievement: 01/30/14 Potential to Achieve Goals: Good  Visit Information  Last PT Received On: 01/16/14 Assistance Needed: +1 History of Present Illness: Patient is a 48 y/o male admitted with right foot drainage x 1 month, now s/p transmetatarsal amputation 01/15/14.       Prior Functioning  Home Living Family/patient expects to be discharged to:: Private residence Living Arrangements: Spouse/significant other Available Help at Discharge: Family Type of Home: House Home Access: Stairs to enter Secretary/administratorntrance Stairs-Number of Steps: 1 Entrance Stairs-Rails: None Home Layout: Two level;Bed/bath upstairs Alternate Level Stairs-Number of Steps: flight Alternate Level Stairs-Rails: Right Home Equipment: None Prior Function Level of Independence: Independent Communication Communication: No difficulties    Cognition  Cognition Arousal/Alertness: Awake/alert Behavior During Therapy: WFL for tasks assessed/performed Overall Cognitive Status: Within Functional Limits for  tasks assessed    Extremity/Trunk Assessment  Upper Extremity Assessment Upper Extremity Assessment: Overall WFL for tasks assessed Lower Extremity Assessment Lower Extremity Assessment: Overall WFL for tasks assessed   Balance Balance Overall balance assessment: Needs assistance Sitting balance-Leahy Scale: Good Standing balance support: Bilateral upper extremity supported Standing balance-Leahy Scale: Poor Standing balance comment: needs assist due to NWB  End of Session PT - End of Session Equipment Utilized During Treatment: Gait belt Activity Tolerance: Patient limited by fatigue Patient left: in bed;with call bell/phone within reach;with family/visitor present  GP     [facility] 01/16/2014, 1:26 PM Sheran Lawless, PT [phone] 01/16/2014

## 2014-01-16 NOTE — Progress Notes (Addendum)
ANTICOAGULATION and ANTIBIOTIC CONSULT NOTE - Follow up Consult  Pharmacy Consult for warfarin, Vancomycin  Indication: VTE prophylaxis/Cellulitis and abscess   Allergies  Allergen Reactions  . Minocycline Anaphylaxis  . Shellfish Allergy Nausea And Vomiting    Patient Measurements: Height: 6\' 3"  (190.5 cm) Weight: 352 lb 11.2 oz (159.984 kg) IBW/kg (Calculated) : 84.5   Vital Signs: Temp: 98.6 F (37 C) (02/14 0522) Temp src: Oral (02/14 0522) BP: 171/85 mmHg (02/14 0950) Pulse Rate: 75 (02/14 0950)  Labs:  Recent Labs  01/15/14 0608 01/16/14 0610  HGB 10.0* 9.8*  HCT 30.3* 30.1*  PLT 386 357  LABPROT  --  13.6  INR  --  1.06  CREATININE 1.41* 1.62*    Estimated Creatinine Clearance: 93.4 ml/min (by C-G formula based on Cr of 1.62).   Medical History: Past Medical History  Diagnosis Date  . Hypertension   . Diabetes mellitus   . Diverticulitis   . 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   AC: On warfarin for VTE prophylaxis. Baseline INR 1.16. INR today trend down to 1.06. H/H low but relatively stable, plts wnl. Per orthopedics note, dressing is clean and dry.   ID: Vanc/Zosyn for diabetic foot infection, S/p I&D 2/7 for Gas gangrene right forefoot with 1st web space abscess. MRI foot revealed concern for necrotizing fasciitis. Now s/p transmetatarsal amputation 2/13- Dr. Lajoyce Cornersuda rec's abx for 3 weeks d/t extensive soft tissue damage. SCr has trended up significantly today and VT was supra-therapeutic at 24.7 today. Discussed patient with hospitalist who wants to stop vancomycin and consult ID for further antibiotic recommendations.   WBC 33.6>>28.1>>22.6>>20>15.6>>16.5, Afebrile.   Goal of Therapy:  INR 2-3 Monitor platelets by anticoagulation protocol: Yes   Plan:  1. Warfarin 10mg  po x1 tonight 2. Daily PT/INR 3. Follow for s/s bleeding 4. Stop  Vancomycin. Continue to monitor renal fx.  5. Continue Zosyn 3.375 gm IV Q 8 hours   Vinnie LevelBenjamin Garyn Arlotta, PharmD.  Clinical Pharmacist Pager [phone]

## 2014-01-16 NOTE — Progress Notes (Signed)
Patient ID: [name] [name], male   DOB: 1966/06/03, 48 y.o.   MRN: [id number] Postoperative day 1 midfoot amputation right foot. Examination the dressing is clean and dry. I will remove the dressing tomorrow and remove the Penrose drain. Patient had a large abscess on the dorsum of his foot and I am concerned with the possibility of ischemic changes to the dorsal skin flap. Do to the extensive nature of his abscess I feel the patient would benefit from 3 weeks of IV antibiotics. Physical therapy nonweightbearing on the right lower extremity. Patient may be touchdown weightbearing on the heel if necessary. Patient may benefit from a kneeling scooter.

## 2014-01-16 NOTE — Evaluation (Signed)
Occupational Therapy Evaluation Patient Details Name: [name] [name] MRN: [id number] DOB: [birth year] Today's Date: 01/16/2014 Time: [id number] OT Time Calculation (min): 24 min  OT Assessment / Plan / Recommendation History of present illness Patient is a 48 y/o male admitted with right foot drainage x 1 month, now s/p transmetatarsal amputation 01/15/14.   Clinical Impression   This 48 yo male admitted and underwent above presents to acute OT with decreased mobility, decreased UE strength for body weight, really needs to be NWB'ing RLE all affecting pt's ability to care for himself. Will benefit from acute OT without need for follow up.    OT Assessment  Patient needs continued OT Services    Follow Up Recommendations  No OT follow up       Equipment Recommendations  3 in 1 bedside comode;Wheelchair (measurements OT);Wheelchair cushion (measurements OT) (wide)       Frequency  Min 2X/week    Precautions / Restrictions Precautions Precautions: Fall Restrictions Weight Bearing Restrictions: Yes RLE Weight Bearing: Touchdown weight bearing (on heel if really necessary per Dr. Audrie Liauda's note 2/14 (prefers NWB'ing))       ADL  Eating/Feeding: Independent Where Assessed - Eating/Feeding: Edge of bed Grooming: Set up Where Assessed - Grooming: Unsupported sitting Upper Body Bathing: Set up Where Assessed - Upper Body Bathing: Supported sitting Lower Body Bathing: Set up Where Assessed - Lower Body Bathing: Unsupported sitting;Lean right and/or left Upper Body Dressing: Set up Where Assessed - Upper Body Dressing: Unsupported sitting Lower Body Dressing: Set up Where Assessed - Lower Body Dressing: Lean right and/or left;Unsupported sitting Toilet Transfer: Minimal assistance Toilet Transfer Method: Squat pivot (to his left) Toilet Transfer Equipment:  (bed to recliner) Toileting - Clothing Manipulation and Hygiene: Modified independent Where Assessed - Medical sales representativeToileting Clothing  Manipulation and Hygiene: Lean right and/or left Equipment Used:  (None) Transfers/Ambulation Related to ADLs: Min guard A squat-pivot both left and right (however to his right he had to put his heel down on the floor). We did talk about him always transferring to the left to help with NWB'ing RLE.    OT Diagnosis: Generalized weakness  OT Problem List: Decreased strength;Decreased activity tolerance;Impaired balance (sitting and/or standing);Decreased knowledge of precautions;Decreased knowledge of use of DME or AE OT Treatment Interventions: Self-care/ADL training;Patient/family education;Balance training;DME and/or AE instruction   OT Goals(Current goals can be found in the care plan section) Acute Rehab OT Goals Patient Stated Goal: To heal foot OT Goal Formulation: With patient Time For Goal Achievement: 01/23/14 Potential to Achieve Goals: Good  Visit Information  Last OT Received On: 01/16/14 Assistance Needed: +1 History of Present Illness: Patient is a 48 y/o male admitted with right foot drainage x 1 month, now s/p transmetatarsal amputation 01/15/14.       Prior Functioning     Home Living Family/patient expects to be discharged to:: Private residence Living Arrangements: Spouse/significant other Available Help at Discharge: Family;Available 24 hours/day Type of Home: House Home Access: Stairs to enter Entergy CorporationEntrance Stairs-Number of Steps: 1 Entrance Stairs-Rails: None Home Layout: Two level;Bed/bath upstairs (will be staying on main with 1/2 bath) Alternate Level Stairs-Number of Steps: flight Alternate Level Stairs-Rails: Right Home Equipment: None Prior Function Level of Independence: Independent Communication Communication: No difficulties Dominant Hand: Right         Vision/Perception Vision - History Patient Visual Report: No change from baseline   Cognition  Cognition Arousal/Alertness: Awake/alert Behavior During Therapy: WFL for tasks  assessed/performed Overall Cognitive Status: Within Functional Limits for  tasks assessed    Extremity/Trunk Assessment Upper Extremity Assessment Upper Extremity Assessment: Generalized weakness (from the standpoint of having to move #352 pounds around)     Mobility Bed Mobility Overal bed mobility: Modified Independent Bed Mobility: Supine to Sit;Sit to Supine Supine to sit: Modified independent (Device/Increase time);HOB elevated Sit to supine: Modified independent (Device/Increase time);HOB elevated General bed mobility comments: for safety due to IV, big man moving quickly Transfers Overall transfer level: Needs assistance Equipment used: Rolling walker (2 wheeled) Transfers: Squat Pivot Transfers Sit to Stand: Min assist Squat pivot transfers: Min assist           End of Session OT - End of Session Equipment Utilized During Treatment:  (None) Activity Tolerance: Patient limited by fatigue Patient left: in bed;with call bell/phone within reach Nurse Communication: Mobility status (Needs to only to transfers to his left)    Evette Georges [phone] 01/16/2014, 4:49 PM

## 2014-01-17 ENCOUNTER — Inpatient Hospital Stay (HOSPITAL_COMMUNITY): Payer: Managed Care, Other (non HMO)

## 2014-01-17 DIAGNOSIS — N179 Acute kidney failure, unspecified: Secondary | ICD-10-CM

## 2014-01-17 LAB — VANCOMYCIN, RANDOM: Vancomycin Rm: 13.2 ug/mL

## 2014-01-17 LAB — BASIC METABOLIC PANEL
BUN: 9 mg/dL (ref 6–23)
CHLORIDE: 103 meq/L (ref 96–112)
CO2: 25 mEq/L (ref 19–32)
Calcium: 8.7 mg/dL (ref 8.4–10.5)
Creatinine, Ser: 2.18 mg/dL — ABNORMAL HIGH (ref 0.50–1.35)
GFR calc non Af Amer: 35 mL/min — ABNORMAL LOW (ref >90)
GFR, EST AFRICAN AMERICAN: 40 mL/min — AB (ref >90)
Glucose, Bld: 124 mg/dL — ABNORMAL HIGH (ref 70–99)
Potassium: 3.7 mEq/L (ref 3.7–5.3)
SODIUM: 138 meq/L (ref 137–147)

## 2014-01-17 LAB — GLUCOSE, CAPILLARY
GLUCOSE-CAPILLARY: 128 mg/dL — AB (ref 70–99)
Glucose-Capillary: 138 mg/dL — ABNORMAL HIGH (ref 70–99)
Glucose-Capillary: 87 mg/dL (ref 70–99)
Glucose-Capillary: 131 mg/dL — ABNORMAL HIGH (ref 70–99)

## 2014-01-17 LAB — PROTIME-INR
INR: 1.19 (ref 0.00–1.49)
Prothrombin Time: 14.8 seconds (ref 11.6–15.2)

## 2014-01-17 MED ORDER — CARVEDILOL 25 MG PO TABS
25.0000 mg | ORAL_TABLET | Freq: Two times a day (BID) | ORAL | Status: DC
  Administered 2014-01-17 – 2014-01-19 (×4): 25 mg via ORAL
  Filled 2014-01-17 (×7): qty 1

## 2014-01-17 MED ORDER — LABETALOL HCL 300 MG PO TABS: 300.0000 mg | ORAL_TABLET | Freq: Three times a day (TID) | ORAL | Status: DC

## 2014-01-17 MED ORDER — WARFARIN SODIUM 10 MG PO TABS
10.0000 mg | ORAL_TABLET | Freq: Once | ORAL | Status: AC
  Administered 2014-01-17: 10 mg via ORAL
  Filled 2014-01-17: qty 1

## 2014-01-17 NOTE — Progress Notes (Signed)
Pt's BP 175/78. MD notified and aware. Told to give scheduled 10:00am medications. Will continue to monitor.

## 2014-01-17 NOTE — Progress Notes (Signed)
Patient ID: [name] [name], male   DOB: 01/18/1966, 48 y.o.   MRN: [id number] Dressing changed this morning and drain removed from right foot. Patient has 2 dorsal areas of blistering with mild ischemic changes of both areas about 2 cm in diameter. Will have the dressing changed daily patient may wash the foot with soap and water. Continue nonweightbearing. Patient may be discharged to home at this time. I will followup in the office in one week. Patient progressed well with therapy yesterday.

## 2014-01-17 NOTE — Progress Notes (Signed)
Physical Therapy Treatment Patient Details Name: [name] [name] MRN: [id number] DOB: 05/10/1966 Today's Date: 01/17/2014 Time: 7829-5621 PT Time Calculation (min): 30 min  PT Assessment / Plan / Recommendation  History of Present Illness Patient is a 48 y/o male admitted with right foot drainage x 1 month, now s/p transmetatarsal amputation 01/15/14.   PT Comments   Session focused on educating wife and pt on stair negotiating techniques. Educated pt on two methods, the backwards method and hop to with RW vs bumping up on bottom with Rt LE extended. Pt unable to safely perform backwards hop to method; was able to perform bump up on bottom method and maintained NWB status; wife encouraged to have increased (A) when pt is D/C to help safely get pt in house. Due to patient's decr'd ability to maintain NWB status, will recommend SPT for primary source of mobility and to use wheelchair for primary source of mobility. RN notified about stair ambulation session. Pt not safe to D/C home from mobility stand point at this time. Pt and wife were even discussing possible rehab consult if pt was not more mobile.   Follow Up Recommendations  Home health PT;Supervision/Assistance - 24 hour     Does the patient have the potential to tolerate intense rehabilitation     Barriers to Discharge        Equipment Recommendations  Rolling walker with 5" wheels;3in1 (PT);Wheelchair (measurements PT);Wheelchair cushion (measurements PT);Other (comment);Hospital bed (bariatric wheelchair- already talked to advance guy)    Recommendations for Other Services OT consult  Frequency Min 5X/week   Progress towards PT Goals Progress towards PT goals: Progressing toward goals  Plan Current plan remains appropriate    Precautions / Restrictions Precautions Precautions: Fall Restrictions Weight Bearing Restrictions: Yes RLE Weight Bearing: Non weight bearing Other Position/Activity Restrictions: per MD note; pt can be  TDWB through Rt heel if needed    Pertinent Vitals/Pain Denied any pain during session. patient repositioned for comfort with Rt LE evlated.     Mobility  Bed Mobility Overal bed mobility: Modified Independent General bed mobility comments: effortful and incr time required Transfers Overall transfer level: Needs assistance Equipment used: Rolling walker (2 wheeled) Transfers: Sit to/from [employer] Sit to Stand: Mod assist;From elevated surface Stand pivot transfers: Mod assist General transfer comment: cues for hand placement and sequencing; pt unable to maintain NWB during transfer; mod (A) to manage RW and maintain balance  Ambulation/Gait Ambulation/Gait assistance: Min assist Ambulation Distance (Feet): 10 Feet Assistive device: Rolling walker (2 wheeled) Gait Pattern/deviations: Step-to pattern Gait velocity: decreased Gait velocity interpretation: <1.8 ft/sec, indicative of risk for recurrent falls General Gait Details: limited due to fatigue from stair training and has difficulty keeping NWB status; cues for sequencing and (A) to maintain balance and manage RW  Stairs: Yes Stairs assistance: Mod assist Stair Management: No rails;Step to pattern;Backwards;With walker Number of Stairs: 2 General stair comments: pt educated thoroughly on two types of techniques for stair ambulation; the up backwards method with wife blocking RW vs. the bumping up the one step on bottom with Rt LE extended to prevent WB; wife and rehab tech present; pt motivated and insisted on attempting hop to option; pt did not verbalize his decision to perform a controlled sit during the stair ambulation technique; pt then bumped up to second step on his own power while maintaining NWB status on Rt LE; pt very discouraged by having to do bump up technique; wife present and giving max encouragement  PT Diagnosis:    PT Problem List:   PT Treatment Interventions:     PT Goals (current  goals can now be found in the care plan section) Acute Rehab PT Goals Patient Stated Goal: to go home PT Goal Formulation: With patient/family Time For Goal Achievement: 01/30/14 Potential to Achieve Goals: Good  Visit Information  Last PT Received On: 01/17/14 Assistance Needed: +2 History of Present Illness: Patient is a 48 y/o male admitted with right foot drainage x 1 month, now s/p transmetatarsal amputation 01/15/14.    Subjective Data  Subjective: pt lying supine; stated "i want to do those steps today to see if i can do them Patient Stated Goal: to go home   Cognition  Cognition Arousal/Alertness: Awake/alert Behavior During Therapy: WFL for tasks assessed/performed Overall Cognitive Status: Within Functional Limits for tasks assessed    Balance  Balance Overall balance assessment: Needs assistance Sitting-balance support: Feet supported;Single extremity supported Sitting balance-Leahy Scale: Good Standing balance support: During functional activity;Bilateral upper extremity supported Standing balance-Leahy Scale: Poor Standing balance comment: requires bil UE support and continues to have difficulty maintaining NWB status  General Comments General comments (skin integrity, edema, etc.): pt with poor balance; unable to locate knee walker at this time to practice; feel as though SPT to wheelchair for mobility will be safest mobility option at this time   End of Session PT - End of Session Equipment Utilized During Treatment: Gait belt Activity Tolerance: Patient limited by fatigue Patient left: in chair;with call bell/phone within reach;with family/visitor present Nurse Communication: Mobility status;Precautions   GP     [name] [name], [name] [name], [place] CarolinaPT [phone] 01/17/2014, 4:14 PM

## 2014-01-17 NOTE — Progress Notes (Signed)
ANTICOAGULATION CONSULT NOTE - Follow up Consult  Pharmacy Consult for warfarin Indication: VTE prophylaxis  Allergies  Allergen Reactions  . Minocycline Anaphylaxis  . Shellfish Allergy Nausea And Vomiting    Patient Measurements: Height: 6\' 3"  (190.5 cm) Weight: 352 lb 11.2 oz (159.984 kg) IBW/kg (Calculated) : 84.5   Vital Signs: Temp: 98 F (36.7 C) (02/15 0517) Temp src: Oral (02/15 0517) BP: 175/78 mmHg (02/15 1013) Pulse Rate: 107 (02/15 1013)  Labs:  Recent Labs  01/15/14 0608 01/16/14 0610 01/17/14 0820  HGB 10.0* 9.8*  --   HCT 30.3* 30.1*  --   PLT 386 357  --   LABPROT  --  13.6 14.8  INR  --  1.06 1.19  CREATININE 1.41* 1.62* 2.18*    Estimated Creatinine Clearance: 69.4 ml/min (by C-G formula based on Cr of 2.18).   Medical History: Past Medical History  Diagnosis Date  . Hypertension   . Diabetes mellitus   . Diverticulitis   . 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   AC: On warfarin for VTE prophylaxis. Baseline INR 1.16. INR now trended up to 1.19. H/H low but relatively stable, plts wnl. Per orthopedics note, dressing is clean and dry. No bleeding noted.   Goal of Therapy:  INR 2-3 Monitor platelets by anticoagulation protocol: Yes   Plan:  1. Warfarin 10mg  po x1 tonight 2. Daily PT/INR 3. Follow for s/s bleeding  [name] [name], PharmD.  Clinical Pharmacist Pager [phone]

## 2014-01-17 NOTE — Progress Notes (Signed)
Patient's BP 194/100. MD notified and aware. Verbal order to give scheduled Coreg and recheck BP in one hour.

## 2014-01-17 NOTE — Progress Notes (Signed)
PATIENT DETAILS Name: [name] [name] Age: 48 y.o. Sex: male Date of Birth: January 06, 1966 Admit Date: 01/08/2014 Admitting Physician Esperanza SheetsUlugbek N Buriev, MD MV[facility]IONGE[facility]P:HARRIS, Chrissie NoaWILLIAM, MD  Subjective: No major complaints.  Assessment/Plan: Necrotizing fasciitis of the right forefoot - Patient was admitted, empirically started on vancomycin clindamycin and Zosyn. MRI did confirm necrotizing infection. 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 - Per Dr. Lajoyce Cornersuda, patient will need at least 3 weeks of IV antibiotics. Currently vancomycin on hold due to supratherapeutic levels, continue Zosyn. Spoke to Dr. Gwenlyn Saranampbell-infectious disease, since cultures are negative, okay to stop vancomycin, and [facility]. We'll plan for 3 weeks of intravenous Invanz from 01/15/14 - Will defer to postop care to orthopedic  Uncontrolled hypertension - BP still uncontrolled-however trend seems to be getting slightly better - Continue with clonidine, amlodipine and hydralazine. We'll change metoprolol to Coreg to get alpha blockade as well.  Acute renal failure - Suspect likely secondary to supratherapeutic vancomycin levels. - Stop vancomycin, and continue to monitor - Clinically euvolemic- stop IV fluids - l check UA, renal ultrasound.  Supratherapeutic vancomycin levels - Stopped vancomycin- as noted above, spoken with Dr. Carin Hockampbell-no need for further vancomycin therapy.  Type 2 diabetes - CBGs stable with Levemir 22 units, and 8 units of NovoLog with meals. Also on SSI. --Hgb A1C: 12.5 on admission; was on Metformin and Invokana at home. We plan to discharge on Levemir.  Hypokalemia - Resolved  Hyponatremia - Resolved  Disposition: Remain inpatient  DVT Prophylaxis: Prophylactic Heparin, warfarin started by  orthopedics for DVT prophylaxis- pharmacy managing Coumadin.  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 amLODipine  10 mg Oral Daily  . carvedilol  25 mg Oral BID WC  . cloNIDine  0.3 mg Oral TID  . docusate sodium  100 mg Oral BID  . heparin subcutaneous  5,000 Units Subcutaneous 3 times per day  . hydrALAZINE  100 mg Oral 3 times per day  . insulin aspart  0-15 Units Subcutaneous TID WC  . insulin aspart  8 Units Subcutaneous TID WC  . insulin detemir  22 Units Subcutaneous QHS  . piperacillin-tazobactam (ZOSYN)  IV  3.375 g Intravenous 3 times per day  . warfarin  10 mg Oral ONCE-1800  . Warfarin - Pharmacist Dosing Inpatient   Does not apply q1800   Continuous Infusions: . sodium chloride 50 mL/hr at 01/16/14 2126  . lactated ringers 50 mL/hr at 01/15/14 1700   PRN Meds:.acetaminophen, acetaminophen, hydrALAZINE, HYDROcodone-acetaminophen, HYDROmorphone (DILAUDID) injection, metoCLOPramide (REGLAN) injection, metoCLOPramide, ondansetron (ZOFRAN) IV, ondansetron, oxyCODONE-acetaminophen, polyethylene glycol  Antibiotics: Anti-infectives   Start     Dose/Rate Route Frequency Ordered Stop   01/14/14 1300  vancomycin (VANCOCIN) IVPB 1000 mg/200 mL premix  Status:  Discontinued     1,000 mg 200 mL/hr over 60 Minutes Intravenous Every 8 hours 01/14/14 0808 01/16/14 1047   01/12/14 2100  vancomycin (VANCOCIN) 1,250 mg in sodium chloride 0.9 % 250 mL IVPB  Status:  Discontinued     1,250 mg 166.7 mL/hr over 90 Minutes Intravenous Every 8 hours 01/12/14 1322 01/14/14 0808   01/09/14 0000  vancomycin (VANCOCIN) 1,500 mg in sodium chloride 0.9 % 500 mL IVPB  Status:  Discontinued     1,500 mg 250 mL/hr over 120 Minutes Intravenous Every 12 hours 01/08/14  1236 01/12/14 1322   01/08/14 2100  piperacillin-tazobactam (ZOSYN) IVPB 3.375 g     3.375 g 12.5 mL/hr over 240 Minutes Intravenous 3 times per day 01/08/14 1526     01/08/14 1530  clindamycin (CLEOCIN) IVPB 600 mg  Status:  Discontinued     600 mg 100 mL/hr over 30 Minutes Intravenous 3 times per day 01/08/14 1514 01/13/14 1652   01/08/14 1230  vancomycin (VANCOCIN) IVPB 1000 mg/200 mL premix  Status:  Discontinued     1,000 mg 200 mL/hr over 60 Minutes Intravenous  Once 01/08/14 1103 01/08/14 1514   01/08/14 1200  piperacillin-tazobactam (ZOSYN) IVPB 3.375 g     3.375 g 12.5 mL/hr over 240 Minutes Intravenous  Once 01/08/14 1152 01/08/14 1627   01/08/14 1200  clindamycin (CLEOCIN) IVPB 600 mg     600 mg 100 mL/hr over 30 Minutes Intravenous  Once 01/08/14 1152 01/08/14 1257   01/08/14 1130  vancomycin (VANCOCIN) IVPB 1000 mg/200 mL premix     1,000 mg 200 mL/hr over 60 Minutes Intravenous  Once 01/08/14 1103 01/08/14 1227       PHYSICAL EXAM: Vital signs in last 24 hours: Filed Vitals:   01/16/14 2124 01/17/14 0517 01/17/14 0641 01/17/14 1013  BP: 161/77 184/93 185/93 175/78  Pulse: 79 70 76 107  Temp: 98.6 F (37 C) 98 F (36.7 C)    TempSrc: Oral Oral    Resp: 18 18    Height:      Weight:      SpO2: 96% 94%      Weight change:  Filed Weights   01/08/14 1422  Weight: 159.984 kg (352 lb 11.2 oz)   Body mass index is 44.08 kg/(m^2).   Gen Exam: Awake and alert with clear speech.   Neck: Supple, No JVD.   Chest: B/L Clear.   CVS: S1 S2 Regular, no murmurs.  Abdomen: soft, BS +, non tender, non distended.  Extremities: no edema, lower extremities warm to touch. Right foot in bandage-did not open. Neurologic: Non Focal.   Skin: No Rash.      Intake/Output from previous day:  Intake/Output Summary (Last 24 hours) at 01/17/14 1130 Last data filed at 01/17/14 1100  Gross per 24 hour  Intake   1344 ml  Output      0 ml  Net   1344 ml     LAB  RESULTS: CBC  Recent Labs Lab 01/11/14 0945 01/12/14 0624 01/13/14 0501 01/15/14 0608 01/16/14 0610  WBC 28.1* 22.6* 20.0* 15.6* 16.5*  HGB 11.0* 10.6* 10.2* 10.0* 9.8*  HCT 32.3* 31.8* 31.1* 30.3* 30.1*  PLT 390 392 426* 386 357  MCV 79.0 78.9 79.5 80.4 80.1  MCH 26.9 26.3 26.1 26.5 26.1  MCHC 34.1 33.3 32.8 33.0 32.6  RDW 14.7 14.6 15.1 15.2 15.4    Chemistries   Recent Labs Lab 01/12/14 0624 01/12/14 1230 01/13/14 0501 01/15/14 0608 01/16/14 0610 01/17/14 0820  NA 139  --  140 135* 135* 138  K 3.5*  --  3.8 3.9 4.2 3.7  CL 102  --  104 100 101 103  CO2 24  --  23 26 24 25   GLUCOSE 157*  --  149* 162* 219* 124*  BUN 13  --  9 7 8 9   CREATININE  1.00  --  1.04 1.41* 1.62* 2.18*  CALCIUM 8.4  --  8.5 8.6 8.4 8.7  MG  --  2.6*  --  2.2  --   --     CBG:  Recent Labs Lab 01/16/14 0735 01/16/14 1225 01/16/14 1704 01/16/14 2121 01/17/14 0802  GLUCAP 192* 180* 98 142* 128*    GFR Estimated Creatinine Clearance: 69.4 ml/min (by C-G formula based on Cr of 2.18).  Coagulation profile  Recent Labs Lab 01/16/14 0610 01/17/14 0820  INR 1.06 1.19    Cardiac Enzymes No results found for this basename: CK, CKMB, TROPONINI, MYOGLOBIN,  in the last 168 hours  No components found with this basename: POCBNP,  No results found for this basename: DDIMER,  in the last 72 hours No results found for this basename: HGBA1C,  in the last 72 hours No results found for this basename: CHOL, HDL, LDLCALC, TRIG, CHOLHDL, LDLDIRECT,  in the last 72 hours No results found for this basename: TSH, T4TOTAL, FREET3, T3FREE, THYROIDAB,  in the last 72 hours No results found for this basename: VITAMINB12, FOLATE, FERRITIN, TIBC, IRON, RETICCTPCT,  in the last 72 hours No results found for this basename: LIPASE, AMYLASE,  in the last 72 hours  Urine Studies No results found for this basename: UACOL, UAPR, USPG, UPH, UTP, UGL, UKET, UBIL, UHGB, UNIT, UROB, ULEU, UEPI, UWBC, URBC,  UBAC, CAST, CRYS, UCOM, BILUA,  in the last 72 hours  MICROBIOLOGY: Recent Results (from the past 240 hour(s))  CULTURE, BLOOD (ROUTINE X 2)     Status: None   Collection Time    01/08/14 12:20 PM      Result Value Ref Range Status   Specimen Description BLOOD LEFT FOREARM   Final   Special Requests BOTTLES DRAWN AEROBIC ONLY 5CC EACH   Final   Culture  Setup Time     Final   Value: 01/08/2014 14:10     Performed at [facility]   Culture     Final   Value: NO GROWTH 5 DAYS     Performed at [facility]   Report Status 01/14/2014 FINAL   Final  MRSA PCR SCREENING     Status: None   Collection Time    01/09/14  7:42 AM      Result Value Ref Range Status   MRSA by PCR NEGATIVE  NEGATIVE Final   Comment:            The GeneXpert MRSA Assay (FDA     approved for NASAL specimens     only), is one component of a     comprehensive MRSA colonization     surveillance program. It is not     intended to diagnose MRSA     infection nor to guide or     monitor treatment for     MRSA infections.  WOUND CULTURE     Status: None   Collection Time    01/09/14 10:35 AM      Result Value Ref Range Status   Specimen Description WOUND LEFT FOOT   Final   Special Requests NONE   Final   Gram Stain     Final   Value: RARE WBC PRESENT, PREDOMINANTLY PMN     NO SQUAMOUS EPITHELIAL CELLS SEEN     MODERATE GRAM POSITIVE COCCI IN PAIRS     Performed at [facility]   Culture     Final   Value: NO GROWTH  2 DAYS     Performed at [facility]   Report Status 01/11/2014 FINAL   Final  ANAEROBIC CULTURE     Status: None   Collection Time    01/09/14 10:35 AM      Result Value Ref Range Status   Specimen Description FOOT LEFT   Final   Special Requests NONE   Final   Gram Stain     Final   Value: NO WBC SEEN     NO SQUAMOUS EPITHELIAL CELLS SEEN     ABUNDANT GRAM POSITIVE COCCI IN PAIRS     MODERATE GRAM VARIABLE ROD     Performed at [facility]    Culture     Final   Value: BACTEROIDES THETAIOTAOMICRON     Note: BETA LACTAMASE POSITIVE     Performed at [facility]   Report Status 01/16/2014 FINAL   Final  MRSA PCR SCREENING     Status: None   Collection Time    01/12/14 12:14 AM      Result Value Ref Range Status   MRSA by PCR NEGATIVE  NEGATIVE Final   Comment:            The GeneXpert MRSA Assay (FDA     approved for NASAL specimens     only), is one component of a     comprehensive MRSA colonization     surveillance program. It is not     intended to diagnose MRSA     infection nor to guide or     monitor treatment for     MRSA infections.  SURGICAL PCR SCREEN     Status: None   Collection Time    01/12/14  3:03 AM      Result Value Ref Range Status   MRSA, PCR NEGATIVE  NEGATIVE Final   Staphylococcus aureus NEGATIVE  NEGATIVE Final   Comment:            The Xpert SA Assay (FDA     approved for NASAL specimens     in patients over 45 years of age),     is one component of     a comprehensive surveillance     program.  Test performance has     been validated by [facility] for patients greater     than or equal to 68 year old.     It is not intended     to diagnose infection nor to     guide or monitor treatment.  SURGICAL PCR SCREEN     Status: None   Collection Time    01/15/14  9:58 AM      Result Value Ref Range Status   MRSA, PCR NEGATIVE  NEGATIVE Final   Staphylococcus aureus NEGATIVE  NEGATIVE Final   Comment:            The Xpert SA Assay (FDA     approved for NASAL specimens     in patients over 33 years of age),     is one component of     a comprehensive surveillance     program.  Test performance has     been validated by [facility] for patients greater     than or equal to 40 year old.     It is not intended     to diagnose infection nor to     guide or monitor  treatment.    RADIOLOGY STUDIES/RESULTS: Mr Foot Right W Wo Contrast  01/09/2014   CLINICAL DATA:   Diabetic with great toe swelling, erythema and drainage. Soft tissue emphysema on radiographs.  EXAM: MRI OF THE RIGHT FOREFOOT WITHOUT AND WITH CONTRAST  TECHNIQUE: Multiplanar, multisequence MR imaging was performed both before and after administration of intravenous contrast.  CONTRAST:  20mL MULTIHANCE GADOBENATE DIMEGLUMINE 529 MG/ML IV SOLN  COMPARISON:  DG FOOT COMPLETE*R* dated 01/08/2014  FINDINGS: There is extensive susceptibility artifact within the plantar aspect of the first web space corresponding with the extensive soft tissue emphysema on the radiographs. There is extension to the skin plantar to the first metatarsal phalangeal joint. There is also dorsal extension into the great toe and along the extensor tendon dorsal to the second metatarsal. There is soft tissue edema and heterogeneous enhancement throughout the forefoot. No focal fluid collections are identified.  There is a small effusion of the first metatarsal phalangeal joint. This joint demonstrates low level synovial enhancement following contrast. There is no evidence of intra-articular air.  The toes and visualized metatarsals demonstrate no suspicious marrow edema, enhancement or cortical destruction to suggest osteomyelitis.  IMPRESSION: 1. As demonstrated radiographically, there is extensive soft tissue emphysema within the forefoot medially, suspicious for necrotizing infection. 2. No drainable abscess identified. 3. No evidence of osteomyelitis. Mild synovial enhancement of the first metatarsal phalangeal joint is noted, potentially due to intra-articular spread of infection. However, there is only a small associated effusion.   Electronically Signed   By: Roxy Horseman M.D.   On: 01/09/2014 08:21   Dg Foot Complete Right  01/08/2014   CLINICAL DATA:  Prior history of soft tissue trauma.  EXAM: RIGHT FOOT COMPLETE - 3+ VIEW  COMPARISON:  None.  FINDINGS: Areas of subcutaneous emphysema project within the soft tissues between the  distal aspect of the first and second digit as well as adjacent to the base of the distal phalanx and along the proximal phalanx of the first digit. No definite cortical destruction is appreciated. Considering the patient's history and extensive emphysematous changes surgical consultation, immediate is recommended. A necrotizing organism within the soft tissues cannot be excluded. There is otherwise no evidence of fracture nor dislocation.  IMPRESSION: Areas of subcutaneous emphysema within the soft tissues adjacent to the first and second digits as well as along the medial aspect of the first digit. Considering the patient's history and the radiographic findings immediate surgical consultation is recommended in etiologies such is a necrotizing organism cannot be excluded. There does not appear to be definite cortical destruction of the bone though if clinically warranted further evaluation with contrasted MRI is recommended These results were called by telephone at the time of interpretation on 01/08/2014 at 11:47 AM to Dr. Gwyneth Sprout , who verbally acknowledged these results.   Electronically Signed   By: Salome Holmes M.D.   On: 01/08/2014 11:48    Jeoffrey Massed, MD  [facility] Pager:[phone]  If 7PM-7AM, please contact night-coverage [email or web address] Password [email or web address] 01/17/2014, 11:30 AM   LOS: 9 days

## 2014-01-18 LAB — BASIC METABOLIC PANEL
BUN: 9 mg/dL (ref 6–23)
CO2: 24 meq/L (ref 19–32)
CREATININE: 2.26 mg/dL — AB (ref 0.50–1.35)
Calcium: 8.6 mg/dL (ref 8.4–10.5)
Chloride: 100 mEq/L (ref 96–112)
GFR calc Af Amer: 39 mL/min — ABNORMAL LOW (ref >90)
GFR, EST NON AFRICAN AMERICAN: 33 mL/min — AB (ref >90)
GLUCOSE: 177 mg/dL — AB (ref 70–99)
Potassium: 3.7 mEq/L (ref 3.7–5.3)
Sodium: 134 mEq/L — ABNORMAL LOW (ref 137–147)

## 2014-01-18 LAB — GLUCOSE, CAPILLARY
GLUCOSE-CAPILLARY: 158 mg/dL — AB (ref 70–99)
GLUCOSE-CAPILLARY: 161 mg/dL — AB (ref 70–99)
Glucose-Capillary: 110 mg/dL — ABNORMAL HIGH (ref 70–99)
Glucose-Capillary: 166 mg/dL — ABNORMAL HIGH (ref 70–99)

## 2014-01-18 LAB — PROTIME-INR
INR: 1.4 (ref 0.00–1.49)
Prothrombin Time: 16.8 seconds — ABNORMAL HIGH (ref 11.6–15.2)

## 2014-01-18 MED ORDER — SODIUM CHLORIDE 0.9 % IV SOLN
1.0000 g | INTRAVENOUS | Status: DC
  Administered 2014-01-18 – 2014-01-19 (×2): 1 g via INTRAVENOUS
  Filled 2014-01-18 (×2): qty 1

## 2014-01-18 MED ORDER — WARFARIN SODIUM 10 MG PO TABS
10.0000 mg | ORAL_TABLET | Freq: Once | ORAL | Status: AC
  Administered 2014-01-18: 10 mg via ORAL
  Filled 2014-01-18: qty 1

## 2014-01-18 MED ORDER — DOXAZOSIN MESYLATE 2 MG PO TABS
2.0000 mg | ORAL_TABLET | Freq: Every day | ORAL | Status: DC
  Administered 2014-01-18: 2 mg via ORAL
  Filled 2014-01-18 (×3): qty 1

## 2014-01-18 NOTE — Progress Notes (Signed)
Chaplain observed pt lying in bed in a darkened room with his back to the door and face turned towards the window.  Chaplain gave emotional support through compassionate conversation and empathic listening.  Chaplain asked pt to let nurse know if needed or wanted to see chaplain.   01/18/14 1600  Clinical Encounter Type  Visited With Patient  Visit Type Spiritual support    Rulon Abideavid B Sherrod, chaplain pager [phone]

## 2014-01-18 NOTE — Progress Notes (Signed)
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   PT Comments   Patient making slow progress with therapy.  Agree with HHPT and equipment needs outlined below.  Will function primarily at w/c level at home.  Follow Up Recommendations  Home health PT;Supervision/Assistance - 24 hour     Does the patient have the potential to tolerate intense rehabilitation     Barriers to Discharge        Equipment Recommendations  Rolling walker with 5" wheels;3in1 (PT);Wheelchair (measurements PT);Wheelchair cushion (measurements PT);Other (comment);Hospital bed (Bariatric wheelchair/equipment)    Recommendations for Other Services OT consult  Frequency Min 5X/week   Progress towards PT Goals Progress towards PT goals: Progressing toward goals  Plan Current plan remains appropriate    Precautions / Restrictions Precautions Precautions: Fall Restrictions Weight Bearing Restrictions: Yes RLE Weight Bearing: Non weight bearing Other Position/Activity Restrictions: per MD note; pt can be TDWB through Rt heel if needed    Pertinent Vitals/Pain     Mobility  Bed Mobility Overal bed mobility: Modified Independent Bed Mobility: Supine to Sit;Sit to Supine Supine to sit: Modified independent (Device/Increase time);HOB elevated Sit to supine: Modified independent (Device/Increase time);HOB elevated General bed mobility comments: Uses bed rail with HOB elevated. Transfers Overall transfer level: Needs assistance Equipment used: Rolling walker (2 wheeled) Transfers: Sit to/from Stand Sit to Stand: Min assist General transfer comment: Cues for hand placement.  Discussed weightbearing status.   Assist to rise to standing and for balance.  Able to maintain NWB status. Ambulation/Gait Ambulation/Gait assistance: Min assist Ambulation Distance (Feet): 20 Feet Assistive device: Rolling walker (2 wheeled) Gait Pattern/deviations: Step-to pattern;Decreased stride length Gait velocity: decreased Gait velocity interpretation: Below normal speed for age/gender General Gait Details: Verbal cues to stand upright.  Able to ambulate 20' with increased effort.      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    Subjective Data  Subjective: Patient reports his BP has been elevated   Cognition  Cognition Arousal/Alertness: Awake/alert Behavior During Therapy: WFL for tasks assessed/performed Overall Cognitive Status: Within Functional Limits for tasks assessed    Balance     End of Session PT - End of Session Equipment Utilized During Treatment: Gait belt (Darco shoe) Activity Tolerance: Patient limited by fatigue Patient left: in bed;with call bell/phone within reach;with family/visitor present Nurse Communication: Mobility status   GP     Vena AustriaDavis, Icy Fuhrmann H 01/18/2014, 10:53 AM Durenda HurtSusan H. Renaldo Fiddleravis, PT, [employer] [facility] Acute Rehab Services Pager [phone]

## 2014-01-18 NOTE — Progress Notes (Signed)
PATIENT DETAILS Name: [name] [name] Age: 48 y.o. Sex: male Date of Birth: October 16, 1966 Admit Date: 01/08/2014 Admitting Physician Esperanza Sheets, MD ZOX:[id number], Chrissie Noa, MD  Subjective: No major complaints. Anxious to go home.  Assessment/Plan: Necrotizing fasciitis of the right forefoot - Patient was admitted, empirically started on vancomycin clindamycin and Zosyn. MRI did confirm necrotizing infection. 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 - Per Dr. Lajoyce Corners, patient will need at least 3 weeks of IV antibiotics. Unfortunately, vancomycin levels became supratherapeutic on, subsequently was placed on hold. Patient was continued on Zosyn.  Spoke to Dr. Gwenlyn Saran disease, since cultures are negative, okay to stop vancomycin, and to change to [facility] on discharge. We'll plan for 3 weeks of intravenous Invanz from 01/15/14 - Will defer to postop care to orthopedic  Uncontrolled hypertension - BP still uncontrolled-however trend seems to be getting slightly better - Continue with Coreg, clonidine, amlodipine and hydralazine. Suspect will need diuretic therapy for better blood pressure control, but given worsening renal function, hesitant to add diuretic therapy at this time. We'll add Cardura for more alpha blockade.  Acute renal failure - Suspect likely secondary to supratherapeutic vancomycin levels. - Stop vancomycin, and continue to monitor-. I will slowly increase in, suspect this will slowly plateau soon. - Clinically euvolemic- not on IV fluids -  UA does not show proteinuria, renal ultrasound does not show hydronephrosis.  Supratherapeutic vancomycin levels - Stopped vancomycin- as noted above, spoken with Dr. Carin Hock need for further vancomycin therapy.  Type 2 diabetes - CBGs stable  with Levemir 22 units, and 8 units of NovoLog with meals. Also on SSI. --Hgb A1C: 12.5 on admission; was on Metformin and Invokana at home. We plan to discharge on Levemir.  Hypokalemia - Resolved  Hyponatremia - Resolved  Disposition: Remain inpatient  DVT Prophylaxis: Prophylactic Heparin, warfarin started by orthopedics for DVT prophylaxis- pharmacy managing Coumadin.  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 amLODipine  10 mg Oral Daily  . carvedilol  25 mg Oral BID WC  . cloNIDine  0.3 mg Oral TID  . docusate sodium  100 mg Oral BID  . doxazosin  2 mg Oral Daily  . ertapenem  1 g Intravenous Q24H  . heparin subcutaneous  5,000 Units Subcutaneous 3 times per day  . hydrALAZINE  100 mg Oral 3 times per day  . insulin aspart  0-15 Units Subcutaneous TID WC  . insulin aspart  8 Units Subcutaneous TID WC  . insulin detemir  22 Units Subcutaneous QHS  . warfarin  10 mg Oral ONCE-1800  . Warfarin - Pharmacist Dosing Inpatient   Does not apply q1800   Continuous Infusions: . sodium chloride 20 mL/hr at 01/17/14 1156  . lactated ringers 50 mL/hr at 01/15/14 1700   PRN Meds:.acetaminophen, acetaminophen, hydrALAZINE, HYDROcodone-acetaminophen, HYDROmorphone (DILAUDID) injection, metoCLOPramide (REGLAN) injection, metoCLOPramide, ondansetron (ZOFRAN) IV, ondansetron, oxyCODONE-acetaminophen, polyethylene glycol  Antibiotics: Anti-infectives   Start     Dose/Rate Route Frequency Ordered Stop   01/18/14 1000  ertapenem (INVANZ) 1 g in sodium chloride 0.9 % 50 mL IVPB     1 g 100 mL/hr over 30  Minutes Intravenous Every 24 hours 01/18/14 1000 02/06/14 1014   01/14/14 1300  vancomycin (VANCOCIN) IVPB 1000 mg/200 mL premix  Status:  Discontinued      1,000 mg 200 mL/hr over 60 Minutes Intravenous Every 8 hours 01/14/14 0808 01/16/14 1047   01/12/14 2100  vancomycin (VANCOCIN) 1,250 mg in sodium chloride 0.9 % 250 mL IVPB  Status:  Discontinued     1,250 mg 166.7 mL/hr over 90 Minutes Intravenous Every 8 hours 01/12/14 1322 01/14/14 0808   01/09/14 0000  vancomycin (VANCOCIN) 1,500 mg in sodium chloride 0.9 % 500 mL IVPB  Status:  Discontinued     1,500 mg 250 mL/hr over 120 Minutes Intravenous Every 12 hours 01/08/14 1236 01/12/14 1322   01/08/14 2100  piperacillin-tazobactam (ZOSYN) IVPB 3.375 g  Status:  Discontinued     3.375 g 12.5 mL/hr over 240 Minutes Intravenous 3 times per day 01/08/14 1526 01/18/14 0947   01/08/14 1530  clindamycin (CLEOCIN) IVPB 600 mg  Status:  Discontinued     600 mg 100 mL/hr over 30 Minutes Intravenous 3 times per day 01/08/14 1514 01/13/14 1652   01/08/14 1230  vancomycin (VANCOCIN) IVPB 1000 mg/200 mL premix  Status:  Discontinued     1,000 mg 200 mL/hr over 60 Minutes Intravenous  Once 01/08/14 1103 01/08/14 1514   01/08/14 1200  piperacillin-tazobactam (ZOSYN) IVPB 3.375 g     3.375 g 12.5 mL/hr over 240 Minutes Intravenous  Once 01/08/14 1152 01/08/14 1627   01/08/14 1200  clindamycin (CLEOCIN) IVPB 600 mg     600 mg 100 mL/hr over 30 Minutes Intravenous  Once 01/08/14 1152 01/08/14 1257   01/08/14 1130  vancomycin (VANCOCIN) IVPB 1000 mg/200 mL premix     1,000 mg 200 mL/hr over 60 Minutes Intravenous  Once 01/08/14 1103 01/08/14 1227       PHYSICAL EXAM: Vital signs in last 24 hours: Filed Vitals:   01/17/14 2035 01/17/14 2224 01/18/14 0627 01/18/14 1053  BP: 177/79 174/80 189/88 168/79  Pulse: 82 80 80 77  Temp: 98.2 F (36.8 C)  98.6 F (37 C)   TempSrc: Oral  Oral   Resp: 18  18   Height:      Weight:      SpO2: 94%  95%     Weight change:  Filed Weights   01/08/14 1422  Weight: 159.984 kg (352 lb 11.2 oz)   Body mass index is 44.08 kg/(m^2).   Gen Exam: Awake and  alert with clear speech.   Neck: Supple, No JVD.   Chest: B/L Clear. No rales or rhonchi.  CVS: S1 S2 Regular, no murmurs.  Abdomen: soft, BS +, non tender, non distended.  Extremities: Trace edema, lower extremities warm to touch. Right foot in bandage-did not open. Neurologic: Non Focal.   Skin: No Rash.      Intake/Output from previous day:  Intake/Output Summary (Last 24 hours) at 01/18/14 1137 Last data filed at 01/18/14 0900  Gross per 24 hour  Intake    702 ml  Output      0 ml  Net    702 ml     LAB RESULTS: CBC  Recent Labs Lab 01/12/14 0624 01/13/14 0501 01/15/14 0608 01/16/14 0610  WBC 22.6* 20.0* 15.6* 16.5*  HGB 10.6* 10.2* 10.0* 9.8*  HCT 31.8* 31.1* 30.3* 30.1*  PLT 392 426* 386 357  MCV 78.9 79.5 80.4 80.1  MCH 26.3 26.1 26.5 26.1  MCHC 33.3 32.8 33.0 32.6  RDW 14.6 15.1 15.2 15.4    Chemistries   Recent Labs Lab 01/12/14 0624 01/12/14 1230 01/13/14 0501 01/15/14 0608 01/16/14 0610 01/17/14 0820 01/18/14 0709  NA 139  --  140 135* 135* 138 134*  K 3.5*  --  3.8 3.9 4.2 3.7 3.7  CL 102  --  104 100 101 103 100  CO2 24  --  23 26 24 25 24   GLUCOSE 157*  --  149* 162* 219* 124* 177*  BUN 13  --  9 7 8 9 9   CREATININE 1.00  --  1.04 1.41* 1.62* 2.18* 2.26*  CALCIUM 8.4  --  8.5 8.6 8.4 8.7 8.6  MG  --  2.6*  --  2.2  --   --   --     CBG:  Recent Labs Lab 01/17/14 0802 01/17/14 1214 01/17/14 1701 01/17/14 2150 01/18/14 0731  GLUCAP 128* 138* 87 131* 158*    GFR Estimated Creatinine Clearance: 67 ml/min (by C-G formula based on Cr of 2.26).  Coagulation profile  Recent Labs Lab 01/16/14 0610 01/17/14 0820 01/18/14 0709  INR 1.06 1.19 1.40    Cardiac Enzymes No results found for this basename: CK, CKMB, TROPONINI, MYOGLOBIN,  in the last 168 hours  No components found with this basename: POCBNP,  No results found for this basename: DDIMER,  in the last 72 hours No results found for this basename: HGBA1C,  in the  last 72 hours No results found for this basename: CHOL, HDL, LDLCALC, TRIG, CHOLHDL, LDLDIRECT,  in the last 72 hours No results found for this basename: TSH, T4TOTAL, FREET3, T3FREE, THYROIDAB,  in the last 72 hours No results found for this basename: VITAMINB12, FOLATE, FERRITIN, TIBC, IRON, RETICCTPCT,  in the last 72 hours No results found for this basename: LIPASE, AMYLASE,  in the last 72 hours  Urine Studies No results found for this basename: UACOL, UAPR, USPG, UPH, UTP, UGL, UKET, UBIL, UHGB, UNIT, UROB, ULEU, UEPI, UWBC, URBC, UBAC, CAST, CRYS, UCOM, BILUA,  in the last 72 hours  MICROBIOLOGY: Recent Results (from the past 240 hour(s))  CULTURE, BLOOD (ROUTINE X 2)     Status: None   Collection Time    01/08/14 12:20 PM      Result Value Ref Range Status   Specimen Description BLOOD LEFT FOREARM   Final   Special Requests BOTTLES DRAWN AEROBIC ONLY 5CC EACH   Final   Culture  Setup Time     Final   Value: 01/08/2014 14:10     Performed at [facility]   Culture     Final   Value: NO GROWTH 5 DAYS     Performed at [facility]   Report Status 01/14/2014 FINAL   Final  MRSA PCR SCREENING     Status: None   Collection Time    01/09/14  7:42 AM      Result Value Ref Range Status   MRSA by PCR NEGATIVE  NEGATIVE Final   Comment:            The GeneXpert MRSA Assay (FDA     approved for NASAL specimens     only), is one component of a     comprehensive MRSA colonization     surveillance program. It is not     intended to diagnose MRSA     infection nor to guide or     monitor treatment for     MRSA infections.  WOUND CULTURE     Status: None   Collection Time    01/09/14 10:35 AM      Result Value Ref Range Status   Specimen Description WOUND LEFT FOOT   Final   Special Requests NONE   Final   Gram Stain     Final   Value: RARE WBC PRESENT, PREDOMINANTLY PMN     NO SQUAMOUS EPITHELIAL CELLS SEEN     MODERATE GRAM POSITIVE COCCI IN PAIRS      Performed at [facility]   Culture     Final   Value: NO GROWTH 2 DAYS     Performed at [facility]   Report Status 01/11/2014 FINAL   Final  ANAEROBIC CULTURE     Status: None   Collection Time    01/09/14 10:35 AM      Result Value Ref Range Status   Specimen Description FOOT LEFT   Final   Special Requests NONE   Final   Gram Stain     Final   Value: NO WBC SEEN     NO SQUAMOUS EPITHELIAL CELLS SEEN     ABUNDANT GRAM POSITIVE COCCI IN PAIRS     MODERATE GRAM VARIABLE ROD     Performed at [facility]   Culture     Final   Value: BACTEROIDES THETAIOTAOMICRON     Note: BETA LACTAMASE POSITIVE     Performed at [facility]   Report Status 01/16/2014 FINAL   Final  MRSA PCR SCREENING     Status: None   Collection Time    01/12/14 12:14 AM      Result Value Ref Range Status   MRSA by PCR NEGATIVE  NEGATIVE Final   Comment:            The GeneXpert MRSA Assay (FDA     approved for NASAL specimens     only), is one component of a     comprehensive MRSA colonization     surveillance program. It is not     intended to diagnose MRSA     infection nor to guide or     monitor treatment for     MRSA infections.  SURGICAL PCR SCREEN     Status: None   Collection Time    01/12/14  3:03 AM      Result Value Ref Range Status   MRSA, PCR NEGATIVE  NEGATIVE Final   Staphylococcus aureus NEGATIVE  NEGATIVE Final   Comment:            The Xpert SA Assay (FDA     approved for NASAL specimens     in patients over 39 years of age),     is one component of     a comprehensive surveillance     program.  Test performance has     been validated by [employer] for patients greater     than or equal to 22 year old.     It is not intended     to diagnose infection nor to     guide or monitor treatment.  SURGICAL PCR SCREEN     Status: None   Collection Time    01/15/14  9:58 AM      Result Value Ref Range Status   MRSA, PCR NEGATIVE  NEGATIVE  Final   Staphylococcus aureus NEGATIVE  NEGATIVE Final   Comment:  The Xpert SA Assay (FDA     approved for NASAL specimens     in patients over 74 years of age),     is one component of     a comprehensive surveillance     program.  Test performance has     been validated by [employer] for patients greater     than or equal to 19 year old.     It is not intended     to diagnose infection nor to     guide or monitor treatment.    RADIOLOGY STUDIES/RESULTS: Mr Foot Right W Wo Contrast  01/09/2014   CLINICAL DATA:  Diabetic with great toe swelling, erythema and drainage. Soft tissue emphysema on radiographs.  EXAM: MRI OF THE RIGHT FOREFOOT WITHOUT AND WITH CONTRAST  TECHNIQUE: Multiplanar, multisequence MR imaging was performed both before and after administration of intravenous contrast.  CONTRAST:  20mL MULTIHANCE GADOBENATE DIMEGLUMINE 529 MG/ML IV SOLN  COMPARISON:  DG FOOT COMPLETE*R* dated 01/08/2014  FINDINGS: There is extensive susceptibility artifact within the plantar aspect of the first web space corresponding with the extensive soft tissue emphysema on the radiographs. There is extension to the skin plantar to the first metatarsal phalangeal joint. There is also dorsal extension into the great toe and along the extensor tendon dorsal to the second metatarsal. There is soft tissue edema and heterogeneous enhancement throughout the forefoot. No focal fluid collections are identified.  There is a small effusion of the first metatarsal phalangeal joint. This joint demonstrates low level synovial enhancement following contrast. There is no evidence of intra-articular air.  The toes and visualized metatarsals demonstrate no suspicious marrow edema, enhancement or cortical destruction to suggest osteomyelitis.  IMPRESSION: 1. As demonstrated radiographically, there is extensive soft tissue emphysema within the forefoot medially, suspicious for necrotizing infection. 2. No drainable  abscess identified. 3. No evidence of osteomyelitis. Mild synovial enhancement of the first metatarsal phalangeal joint is noted, potentially due to intra-articular spread of infection. However, there is only a small associated effusion.   Electronically Signed   By: Roxy Horseman M.D.   On: 01/09/2014 08:21   Dg Foot Complete Right  01/08/2014   CLINICAL DATA:  Prior history of soft tissue trauma.  EXAM: RIGHT FOOT COMPLETE - 3+ VIEW  COMPARISON:  None.  FINDINGS: Areas of subcutaneous emphysema project within the soft tissues between the distal aspect of the first and second digit as well as adjacent to the base of the distal phalanx and along the proximal phalanx of the first digit. No definite cortical destruction is appreciated. Considering the patient's history and extensive emphysematous changes surgical consultation, immediate is recommended. A necrotizing organism within the soft tissues cannot be excluded. There is otherwise no evidence of fracture nor dislocation.  IMPRESSION: Areas of subcutaneous emphysema within the soft tissues adjacent to the first and second digits as well as along the medial aspect of the first digit. Considering the patient's history and the radiographic findings immediate surgical consultation is recommended in etiologies such is a necrotizing organism cannot be excluded. There does not appear to be definite cortical destruction of the bone though if clinically warranted further evaluation with contrasted MRI is recommended These results were called by telephone at the time of interpretation on 01/08/2014 at 11:47 AM to Dr. Gwyneth Sprout , who verbally acknowledged these results.   Electronically Signed   By: Salome Holmes M.D.   On: 01/08/2014 11:48  Jeoffrey Massed, MD  [facility] Pager:[phone]  If 7PM-7AM, please contact night-coverage [email or web address] Password [facility] 01/18/2014, 11:37 AM   LOS: 10 days

## 2014-01-18 NOTE — Progress Notes (Signed)
ANTICOAGULATION CONSULT NOTE - Follow up Consult  Pharmacy Consult for warfarin/Invanz Indication: VTE prophylaxis/Necrotizing fasciitis of the right forefoot   Allergies  Allergen Reactions  . Minocycline Anaphylaxis  . Shellfish Allergy Nausea And Vomiting    Patient Measurements: Height: 6\' 3"  (190.5 cm) Weight: 352 lb 11.2 oz (159.984 kg) IBW/kg (Calculated) : 84.5   Vital Signs: Temp: 98.6 F (37 C) (02/16 0627) Temp src: Oral (02/16 0627) BP: 189/88 mmHg (02/16 0627) Pulse Rate: 80 (02/16 0627)  Labs:  Recent Labs  01/16/14 0610 01/17/14 0820 01/18/14 0709  HGB 9.8*  --   --   HCT 30.1*  --   --   PLT 357  --   --   LABPROT 13.6 14.8 16.8*  INR 1.06 1.19 1.40  CREATININE 1.62* 2.18* 2.26*    Estimated Creatinine Clearance: 67 ml/min (by C-G formula based on Cr of 2.26).   Medical History: Past Medical History  Diagnosis Date  . Hypertension   . Diabetes mellitus   . Diverticulitis   . Cellulitis and abscess of leg 01/08/2014    RT LEG    Assessment: 47 YOM with necrotizing fasciitis of right foot now s/p transmetatarsal amputation. He is on warfarin for VTE prophylaxis. Baseline INR 1.4, trending up. No new cbc, no bleeding noted per chart.  Pharmacy is also consulted to change antibiotics to Invanz in anticipating for discharge on 3 weeks of IV antibiotics. Current est, crcl ~ 40 ml/min  Goal of Therapy:  INR 2-3 Monitor platelets by anticoagulation protocol: Yes   Plan:  - Warfarin 10mg  po x1 tonight - Daily PT/INR - Follow for s/s bleeding - Invanz 1g IV Q 24 hrs, through 3/6  Bayard HuggerMei Lacoya Wilbanks, PharmD, BCPS  Clinical Pharmacist  Pager: [phone]

## 2014-01-18 NOTE — Progress Notes (Signed)
Occupational Therapy Treatment Patient Details Name: [name] [name] MRN: [id number] DOB: 16-Dec-1965 Today's Date: 01/18/2014 Time: [id number] OT Time Calculation (min): 11 min  OT Assessment / Plan / Recommendation  History of present illness Patient is a 48 y/o male admitted with right foot drainage x 1 month, now s/p transmetatarsal amputation 01/15/14.   OT comments  This 48 yo making progress.  Follow Up Recommendations  No OT follow up       Equipment Recommendations  3 in 1 bedside comode;Wheelchair (measurements OT);Wheelchair cushion (measurements OT)       Frequency Min 2X/week   Progress towards OT Goals Progress towards OT goals: Progressing toward goals  Plan Discharge plan remains appropriate    Precautions / Restrictions Precautions Precautions: Fall Restrictions Weight Bearing Restrictions: Yes RLE Weight Bearing: Non weight bearing Other Position/Activity Restrictions: per MD note; pt can be TDWB through Rt heel if needed          OT Goals(current goals can now be found in the care plan section)    Visit Information  Last OT Received On: 01/18/14 Assistance Needed: +2 History of Present Illness: Patient is a 48 y/o male admitted with right foot drainage x 1 month, now s/p transmetatarsal amputation 01/15/14.          Cognition  Cognition Arousal/Alertness: Awake/alert Behavior During Therapy: WFL for tasks assessed/performed Overall Cognitive Status: Within Functional Limits for tasks assessed       Exercises  Other Exercises Other Exercises: Pt instructed in bed level Level 2 theraband exercises with Bil UEs. 10 reps pushing towards feet, reaching for the ceiling, elbow extension only, reach across, and reach across pull back. Handout given with exercies on it.      End of Session OT - End of Session Activity Tolerance: Patient tolerated treatment well Patient left: in bed       [name] [name], [name] [name] [phone] 01/18/2014, 4:14 PM

## 2014-01-18 NOTE — Progress Notes (Signed)
UR completed. Charlee Squibb RN CCM Case Mgmt phone [phone] 

## 2014-01-19 ENCOUNTER — Inpatient Hospital Stay (HOSPITAL_COMMUNITY): Payer: Managed Care, Other (non HMO)

## 2014-01-19 ENCOUNTER — Encounter (HOSPITAL_COMMUNITY): Payer: Self-pay | Admitting: Orthopedic Surgery

## 2014-01-19 LAB — GLUCOSE, CAPILLARY
GLUCOSE-CAPILLARY: 136 mg/dL — AB (ref 70–99)
Glucose-Capillary: 118 mg/dL — ABNORMAL HIGH (ref 70–99)

## 2014-01-19 LAB — BASIC METABOLIC PANEL
BUN: 8 mg/dL (ref 6–23)
CALCIUM: 8.7 mg/dL (ref 8.4–10.5)
CO2: 24 mEq/L (ref 19–32)
CREATININE: 2.13 mg/dL — AB (ref 0.50–1.35)
Chloride: 103 mEq/L (ref 96–112)
GFR calc Af Amer: 41 mL/min — ABNORMAL LOW (ref >90)
GFR, EST NON AFRICAN AMERICAN: 36 mL/min — AB (ref >90)
Glucose, Bld: 136 mg/dL — ABNORMAL HIGH (ref 70–99)
Potassium: 3.8 mEq/L (ref 3.7–5.3)
SODIUM: 139 meq/L (ref 137–147)

## 2014-01-19 LAB — PROTIME-INR
INR: 1.45 (ref 0.00–1.49)
PROTHROMBIN TIME: 17.3 s — AB (ref 11.6–15.2)

## 2014-01-19 MED ORDER — AMLODIPINE BESYLATE 10 MG PO TABS: 10.0000 mg | ORAL_TABLET | Freq: Every day | ORAL | Status: DC

## 2014-01-19 MED ORDER — SODIUM CHLORIDE 0.9 % IV SOLN: 1.0000 g | INTRAVENOUS | Status: DC

## 2014-01-19 MED ORDER — FREESTYLE SYSTEM KIT: 1.0000 | PACK | Status: DC | PRN

## 2014-01-19 MED ORDER — HYDROCODONE-ACETAMINOPHEN 5-325 MG PO TABS: 1.0000 | ORAL_TABLET | Freq: Four times a day (QID) | ORAL | Status: DC | PRN

## 2014-01-19 MED ORDER — CLONIDINE HCL 0.3 MG PO TABS: 0.3000 mg | ORAL_TABLET | Freq: Every day | ORAL | Status: DC

## 2014-01-19 MED ORDER — INSULIN ASPART 100 UNIT/ML FLEXPEN: 8.0000 [IU] | PEN_INJECTOR | Freq: Three times a day (TID) | SUBCUTANEOUS | Status: DC

## 2014-01-19 MED ORDER — CARVEDILOL 25 MG PO TABS: 25.0000 mg | ORAL_TABLET | Freq: Two times a day (BID) | ORAL | Status: DC

## 2014-01-19 MED ORDER — INSULIN PEN NEEDLE 32G X 4 MM MISC: Status: DC

## 2014-01-19 MED ORDER — HYDRALAZINE HCL 100 MG PO TABS: 100.0000 mg | ORAL_TABLET | Freq: Three times a day (TID) | ORAL | Status: DC

## 2014-01-19 MED ORDER — DOXAZOSIN MESYLATE 4 MG PO TABS
4.0000 mg | ORAL_TABLET | Freq: Every day | ORAL | Status: DC
  Administered 2014-01-19: 4 mg via ORAL
  Filled 2014-01-19: qty 1

## 2014-01-19 MED ORDER — WARFARIN SODIUM 5 MG PO TABS: 10.0000 mg | ORAL_TABLET | Freq: Every day | ORAL | Status: DC

## 2014-01-19 MED ORDER — INSULIN DETEMIR 100 UNIT/ML FLEXPEN: 22.0000 [IU] | PEN_INJECTOR | Freq: Every day | SUBCUTANEOUS | Status: DC

## 2014-01-19 MED ORDER — WARFARIN SODIUM 10 MG PO TABS
10.0000 mg | ORAL_TABLET | Freq: Once | ORAL | Status: DC
  Filled 2014-01-19: qty 1

## 2014-01-19 MED ORDER — DOXAZOSIN MESYLATE 4 MG PO TABS: 4.0000 mg | ORAL_TABLET | Freq: Every day | ORAL | Status: DC

## 2014-01-19 NOTE — Procedures (Signed)
RUE PICC  No comp

## 2014-01-19 NOTE — Progress Notes (Signed)
Patient discharge teaching given, including activity, diet, follow-up appoints, and medications. Patient verbalized understanding of all discharge instructions. IV access was d/c'd. Vitals are stable. Skin is intact except as charted in most recent assessments. Pt to be escorted out by NT, to be driven home by family. 

## 2014-01-19 NOTE — Progress Notes (Signed)
PT Cancellation Note  Patient Details Name: [name] ChanceReginald F Scheaffer MRN: [id number] DOB: Sep 19, 1966   Cancelled Treatment:    Reason Eval/Treat Not Completed: Patient at procedure or test/unavailable.  Pt going to have PICC line placed.  PT to check back later today.    Thanks,   Rollene Rotundaebecca B. Parthiv Mucci, PT, DPT [phone]   01/19/2014, 10:53 AM

## 2014-01-19 NOTE — Progress Notes (Signed)
01/19/14 Contacted Donna with [facility] and informed her of  PT/INR draw on 01/21/14. Spoke with patient about equipment, he would like a hospital bed eventhough it will probably not be covered by his insurance. He is agreeable with rental cost $128/month. Contacted Justin at [facility] and requested hospital bed, wheelchair with cushion, 3N1  and rolling walker. The equipment will be delivered to patient's home except forthe rolling walker. Jacquelynn CreeMary Verity Gilcrest RN, BSN   01/18/14 Spoke with patient and his wife about IV antibiotics at home. They selected [facility]. [facility] with [facility]. Will continue to follow patient for equipmment needs. Jacquelynn CreeMary Hasset Chaviano RN, BSN, CCM

## 2014-01-19 NOTE — Discharge Instructions (Addendum)
Nonweightbearing right foot. Keep foot elevated with the heart at all times. Wash foot with soap and water daily. Applied dry dressing daily.  Information on my medicine - Coumadin   (Warfarin)  This medication education was reviewed with me or my healthcare representative as part of my discharge preparation.  The pharmacist that spoke with me during my hospital stay was:  Riki RuskBell, Quint Chestnut Wang, [name] Center[facility]PH  Why was Coumadin prescribed for you? Coumadin was prescribed for you because you have a blood clot or a medical condition that can cause an increased risk of forming blood clots. Blood clots can cause serious health problems by blocking the flow of blood to the heart, lung, or brain. Coumadin can prevent harmful blood clots from forming. As a reminder your indication for Coumadin is:   Blood Clot Prevention After Orthopedic Surgery  What test will check on my response to Coumadin? While on Coumadin (warfarin) you will need to have an INR test regularly to ensure that your dose is keeping you in the desired range. The INR (international normalized ratio) number is calculated from the result of the laboratory test called prothrombin time (PT).  If an INR APPOINTMENT HAS NOT ALREADY BEEN MADE FOR YOU please schedule an appointment to have this lab work done by your health care provider within 7 days. Your INR goal is usually a number between:  2 to 3.  What  do you need to  know  About  COUMADIN? Take Coumadin (warfarin) exactly as prescribed by your healthcare provider about the same time each day.  DO NOT stop taking without talking to the doctor who prescribed the medication.  Stopping without other blood clot prevention medication to take the place of Coumadin may increase your risk of developing a new clot or stroke.  Get refills before you run out.  What do you do if you miss a dose? If you miss a dose, take it as soon as you remember on the same day then continue your regularly scheduled regimen the  next day.  Do not take two doses of Coumadin at the same time.  Important Safety Information A possible side effect of Coumadin (Warfarin) is an increased risk of bleeding. You should call your healthcare provider right away if you experience any of the following:   Bleeding from an injury or your nose that does not stop.   Unusual colored urine (red or dark brown) or unusual colored stools (red or black).   Unusual bruising for unknown reasons.   A serious fall or if you hit your head (even if there is no bleeding).  Some foods or medicines interact with Coumadin (warfarin) and might alter your response to warfarin. To help avoid this:   Eat a balanced diet, maintaining a consistent amount of Vitamin K.   Notify your provider about major diet changes you plan to make.   Avoid alcohol or limit your intake to 1 drink for women and 2 drinks for men per day. (1 drink is 5 oz. wine, 12 oz. beer, or 1.5 oz. liquor.)  Make sure that ANY health care provider who prescribes medication for you knows that you are taking Coumadin (warfarin).  Also make sure the healthcare provider who is monitoring your Coumadin knows when you have started a new medication including herbals and non-prescription products.  Coumadin (Warfarin)  Major Drug Interactions  Increased Warfarin Effect Decreased Warfarin Effect  Alcohol (large quantities) Antibiotics (esp. Septra/Bactrim, Flagyl, Cipro) Amiodarone (Cordarone) Aspirin (ASA) Cimetidine (Tagamet)  Megestrol (Megace) NSAIDs (ibuprofen, naproxen, etc.) Piroxicam (Feldene) Propafenone (Rythmol SR) Propranolol (Inderal) Isoniazid (INH) Posaconazole (Noxafil) Barbiturates (Phenobarbital) Carbamazepine (Tegretol) Chlordiazepoxide (Librium) Cholestyramine (Questran) Griseofulvin Oral Contraceptives Rifampin Sucralfate (Carafate) Vitamin K   Coumadin (Warfarin) Major Herbal Interactions  Increased Warfarin Effect Decreased Warfarin Effect   Garlic Ginseng Ginkgo biloba Coenzyme Q10 Green tea St. Johns wort    Coumadin (Warfarin) FOOD Interactions  Eat a consistent number of servings per week of foods HIGH in Vitamin K (1 serving =  cup)  Collards (cooked, or boiled & drained) Kale (cooked, or boiled & drained) Mustard greens (cooked, or boiled & drained) Parsley *serving size only =  cup Spinach (cooked, or boiled & drained) Swiss chard (cooked, or boiled & drained) Turnip greens (cooked, or boiled & drained)  Eat a consistent number of servings per week of foods MEDIUM-HIGH in Vitamin K (1 serving = 1 cup)  Asparagus (cooked, or boiled & drained) Broccoli (cooked, boiled & drained, or raw & chopped) Brussel sprouts (cooked, or boiled & drained) *serving size only =  cup Lettuce, raw (green leaf, endive, romaine) Spinach, raw Turnip greens, raw & chopped   These websites have more information on Coumadin (warfarin):  http://www.king-russell.com/; https://www.hines.net/;

## 2014-01-19 NOTE — Discharge Summary (Addendum)
PATIENT DETAILS Name: [name] [name] Age: 48 y.o. Sex: male Date of Birth: 1966/08/30 MRN: [id number]. Admit Date: 01/08/2014 Admitting Physician: Kinnie Feil, MD [id number], Gwyndolyn Saxon, MD  Recommendations for Outpatient Follow-up:  1. Please check chemistry at next visit. 2. Home health RN to draw chemistries and PT/INR on 2/19-results to be faxed to Dr. Doreene Adas office- knees and does Coumadin from there on. 3. Followup with Dr. Sharol Given for wound care issues 4. Optimize antihypertensive medications  PRIMARY DISCHARGE DIAGNOSIS:  Principal Problem:   Necrotizing fasciitis of the right foot Active Problems:   Cellulitis and abscess   Malignant hypertension   Diabetes mellitus, type 2   Sepsis   Insulin dependent type 2 diabetes mellitus, uncontrolled   Acute renal failure      PAST MEDICAL HISTORY: Past Medical History  Diagnosis Date  . Hypertension   . Diabetes mellitus   . Diverticulitis   . Cellulitis and abscess of leg 01/08/2014    RT LEG    DISCHARGE MEDICATIONS:   Medication List    STOP taking these medications       ciprofloxacin 500 MG tablet  Commonly known as:  CIPRO     INVOKANA 300 MG Tabs  Generic drug:  Canagliflozin     Olmesartan-Amlodipine-HCTZ 40-5-25 MG Tabs     silver sulfADIAZINE 1 % cream  Commonly known as:  SILVADENE      TAKE these medications       amLODipine 10 MG tablet  Commonly known as:  NORVASC  Take 1 tablet (10 mg total) by mouth daily.     carvedilol 25 MG tablet  Commonly known as:  COREG  Take 1 tablet (25 mg total) by mouth 2 (two) times daily with a meal.     cetirizine 10 MG tablet  Commonly known as:  ZYRTEC  Take 10 mg by mouth every morning.     cloNIDine 0.3 MG tablet  Commonly known as:  CATAPRES  Take 1 tablet (0.3 mg total) by mouth daily.     doxazosin 4 MG tablet  Commonly known as:  CARDURA  Take 1 tablet (4 mg total) by mouth daily.     glucose monitoring kit monitoring kit  1 each  by Does not apply route as needed for other. Use as directed     hydrALAZINE 100 MG tablet  Commonly known as:  APRESOLINE  Take 1 tablet (100 mg total) by mouth 3 (three) times daily.     HYDROcodone-acetaminophen 5-325 MG per tablet  Commonly known as:  NORCO/VICODIN  Take 1-2 tablets by mouth every 6 (six) hours as needed for moderate pain.     insulin aspart 100 UNIT/ML FlexPen  Commonly known as:  NOVOLOG FLEXPEN  Inject 8 Units into the skin 3 (three) times daily with meals.     Insulin Detemir 100 UNIT/ML Pen  Commonly known as:  LEVEMIR  Inject 22 Units into the skin daily at 10 pm.     Insulin Pen Needle 32G X 4 MM Misc  Commonly known as:  INSUPEN PEN NEEDLES  Use as directed     metFORMIN 500 MG 24 hr tablet  Commonly known as:  GLUCOPHAGE-XR  Take 1,000 mg by mouth daily with breakfast.     sodium chloride 0.9 % SOLN 50 mL with ertapenem 1 G SOLR 1 g  Inject 1 g into the vein daily. 18 more days from 01/19/14     warfarin 5 MG tablet  Commonly known  as:  COUMADIN  Take 2 tablets (10 mg total) by mouth daily at 6 PM. Home health RN will check your INR on 2/19, fax those results to Dr. Doreene Adas office, Dr. Doreene Adas office will dose Coumadin from there on.        ALLERGIES:   Allergies  Allergen Reactions  . Minocycline Anaphylaxis  . Shellfish Allergy Nausea And Vomiting    BRIEF HPI:  See H&P, Labs, Consult and Test reports for all details in brief, patient is a 48 year old male with a history of diabetes, hypertension who came into the hospital with a right foot wound and drainage. He was found to have a WBC of 38,000, initial x-ray showed soft tissue emphysema. He was empirically started on antibiotics and admitted to the hospitalist service.  CONSULTATIONS:   ID and orthopedic surgery  PERTINENT RADIOLOGIC STUDIES: US Renal  01/17/2014   CLINICAL DATA:  Acute renal failure.  EXAM: RENAL/URINARY TRACT ULTRASOUND COMPLETE  COMPARISON:  No priors.   FINDINGS: Right Kidney:  Length: 14.4 cm. Mild diffuse increased echogenicity throughout the renal parenchyma. No mass or hydronephrosis visualized.  Left Kidney:  Length: 15.3 cm. Mild diffuse increased echogenicity throughout the renal parenchyma. No mass or hydronephrosis visualized.  Bladder:  Appears normal for degree of bladder distention.  IMPRESSION: 1. Increased echogenicity in the renal parenchyma bilaterally which may suggest underlying medical renal disease. 2. No hydronephrosis or other acute findings.   Electronically Signed   By: Vinnie Langton M.D.   On: 01/17/2014 16:02   Mr Foot Right W Wo Contrast  01/09/2014   CLINICAL DATA:  Diabetic with great toe swelling, erythema and drainage. Soft tissue emphysema on radiographs.  EXAM: MRI OF THE RIGHT FOREFOOT WITHOUT AND WITH CONTRAST  TECHNIQUE: Multiplanar, multisequence MR imaging was performed both before and after administration of intravenous contrast.  CONTRAST:  17m MULTIHANCE GADOBENATE DIMEGLUMINE 529 MG/ML IV SOLN  COMPARISON:  DG FOOT COMPLETE*R* dated 01/08/2014  FINDINGS: There is extensive susceptibility artifact within the plantar aspect of the first web space corresponding with the extensive soft tissue emphysema on the radiographs. There is extension to the skin plantar to the first metatarsal phalangeal joint. There is also dorsal extension into the great toe and along the extensor tendon dorsal to the second metatarsal. There is soft tissue edema and heterogeneous enhancement throughout the forefoot. No focal fluid collections are identified.  There is a small effusion of the first metatarsal phalangeal joint. This joint demonstrates low level synovial enhancement following contrast. There is no evidence of intra-articular air.  The toes and visualized metatarsals demonstrate no suspicious marrow edema, enhancement or cortical destruction to suggest osteomyelitis.  IMPRESSION: 1. As demonstrated radiographically, there is extensive  soft tissue emphysema within the forefoot medially, suspicious for necrotizing infection. 2. No drainable abscess identified. 3. No evidence of osteomyelitis. Mild synovial enhancement of the first metatarsal phalangeal joint is noted, potentially due to intra-articular spread of infection. However, there is only a small associated effusion.   Electronically Signed   By: BCamie PatienceM.D.   On: 01/09/2014 08:21   Dg Foot Complete Right  01/08/2014   CLINICAL DATA:  Prior history of soft tissue trauma.  EXAM: RIGHT FOOT COMPLETE - 3+ VIEW  COMPARISON:  None.  FINDINGS: Areas of subcutaneous emphysema project within the soft tissues between the distal aspect of the first and second digit as well as adjacent to the base of the distal phalanx and along the proximal phalanx of the first  digit. No definite cortical destruction is appreciated. Considering the patient's history and extensive emphysematous changes surgical consultation, immediate is recommended. A necrotizing organism within the soft tissues cannot be excluded. There is otherwise no evidence of fracture nor dislocation.  IMPRESSION: Areas of subcutaneous emphysema within the soft tissues adjacent to the first and second digits as well as along the medial aspect of the first digit. Considering the patient's history and the radiographic findings immediate surgical consultation is recommended in etiologies such is a necrotizing organism cannot be excluded. There does not appear to be definite cortical destruction of the bone though if clinically warranted further evaluation with contrasted MRI is recommended These results were called by telephone at the time of interpretation on 01/08/2014 at 11:47 AM to Dr. Blanchie Dessert , who verbally acknowledged these results.   Electronically Signed   By: Margaree Mackintosh M.D.   On: 01/08/2014 11:48     PERTINENT LAB RESULTS: CBC: No results found for this basename: WBC, HGB, HCT, PLT,  in the last 72  hours CMET CMP     Component Value Date/Time   NA 139 01/19/2014 0536   K 3.8 01/19/2014 0536   CL 103 01/19/2014 0536   CO2 24 01/19/2014 0536   GLUCOSE 136* 01/19/2014 0536   BUN 8 01/19/2014 0536   CREATININE 2.13* 01/19/2014 0536   CALCIUM 8.7 01/19/2014 0536   PROT 7.1 01/09/2014 0546   ALBUMIN 1.9* 01/09/2014 0546   AST 12 01/09/2014 0546   ALT 11 01/09/2014 0546   ALKPHOS 103 01/09/2014 0546   BILITOT 0.5 01/09/2014 0546   GFRNONAA 36* 01/19/2014 0536   GFRAA 41* 01/19/2014 0536    GFR Estimated Creatinine Clearance: 71.1 ml/min (by C-G formula based on Cr of 2.13). No results found for this basename: LIPASE, AMYLASE,  in the last 72 hours No results found for this basename: CKTOTAL, CKMB, CKMBINDEX, TROPONINI,  in the last 72 hours No components found with this basename: POCBNP,  No results found for this basename: DDIMER,  in the last 72 hours No results found for this basename: HGBA1C,  in the last 72 hours No results found for this basename: CHOL, HDL, LDLCALC, TRIG, CHOLHDL, LDLDIRECT,  in the last 72 hours No results found for this basename: TSH, T4TOTAL, FREET3, T3FREE, THYROIDAB,  in the last 72 hours No results found for this basename: VITAMINB12, FOLATE, FERRITIN, TIBC, IRON, RETICCTPCT,  in the last 72 hours Coags:  Recent Labs  01/18/14 0709 01/19/14 0536  INR 1.40 1.45   Microbiology: Recent Results (from the past 240 hour(s))  MRSA PCR SCREENING     Status: None   Collection Time    01/12/14 12:14 AM      Result Value Ref Range Status   MRSA by PCR NEGATIVE  NEGATIVE Final   Comment:            The GeneXpert MRSA Assay (FDA     approved for NASAL specimens     only), is one component of a     comprehensive MRSA colonization     surveillance program. It is not     intended to diagnose MRSA     infection nor to guide or     monitor treatment for     MRSA infections.  SURGICAL PCR SCREEN     Status: None   Collection Time    01/12/14  3:03 AM      Result Value  Ref Range Status   MRSA, PCR  NEGATIVE  NEGATIVE Final   Staphylococcus aureus NEGATIVE  NEGATIVE Final   Comment:            The Xpert SA Assay (FDA     approved for NASAL specimens     in patients over 85 years of age),     is one component of     a comprehensive surveillance     program.  Test performance has     been validated by [employer] for patients greater     than or equal to 77 year old.     It is not intended     to diagnose infection nor to     guide or monitor treatment.  SURGICAL PCR SCREEN     Status: None   Collection Time    01/15/14  9:58 AM      Result Value Ref Range Status   MRSA, PCR NEGATIVE  NEGATIVE Final   Staphylococcus aureus NEGATIVE  NEGATIVE Final   Comment:            The Xpert SA Assay (FDA     approved for NASAL specimens     in patients over 74 years of age),     is one component of     a comprehensive surveillance     program.  Test performance has     been validated by [employer] for patients greater     than or equal to 59 year old.     It is not intended     to diagnose infection nor to     guide or monitor treatment.     BRIEF HOSPITAL COURSE:  Necrotizing fasciitis of the right forefoot  - Patient was admitted, empirically started on vancomycin clindamycin and Zosyn. MRI did confirm necrotizing infection. 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  - Per Dr. Sharol Given, patient will need at least 3 weeks of IV antibiotics. Unfortunately, vancomycin levels became supratherapeutic, subsequently was placed on hold. Patient was continued on Zosyn. Spoke to Dr. Carollee Herter disease, since cultures are negative, okay to stop vancomycin, and to change to [facility] on discharge. We'll plan for 3 weeks of intravenous Invanz from 01/15/14. Midline PICC placed on 2/17  by interventional radiology. - At the recommendation of orthopedics, patient has been started on Coumadin for DVT prophylaxis. Currently on 10 mg of Coumadin INR has slowly increased to 1.46, would ask home health RN to draw a PT/INR on 2/19 and follow those results to patient's primary care practitioner. - Patient to followup with Dr. Sharol Given in one week for wound care. Please see below for wound care instructions.  Uncontrolled hypertension  - BP still uncontrolled-however trend seems to be getting slightly better  - He has been placed on Coreg, and Cardura. We have adjusted dosing of clonidine, amlodipine and hydralazine. Once renal failure gets better, please start on diuretic therapy. - Blood pressure continued to be a challenge while he was here in the hospital.  Acute renal failure  - Suspect likely secondary to supratherapeutic vancomycin levels. Vancomycin was subsequently stopped. Renal failure is now starting to get better, creatinine peaked at - Stop vancomycin, and continue to monitor-. I will slowly increas 2.26, it is no him down to 2.13. Suspect that it'll continue to improve.  -  UA does not show proteinuria, renal ultrasound does not show hydronephrosis.  - Further monitoring can be done in the outpatient setting, have asked home health RN to draw a chemistry panel in 2/19 and followed dose results to patient's primary care practitioner's office.  Supratherapeutic vancomycin levels  - Stopped vancomycin- as noted above, spoken with Dr. Darrold Junker need for further vancomycin therapy.   Type 2 diabetes  - CBGs stable with Levemir 22 units, and 8 units of NovoLog with meals. Also on SSI.  --Hgb A1C: 12.5 on admission; was on Metformin and Invokana at home. We plan to discharge on Levemir.   Hypokalemia  - Resolved   Hyponatremia  - Resolved  TODAY-DAY OF DISCHARGE:  Subjective:   [name] [name] today has no headache,no chest abdominal pain,no new weakness tingling or  numbness, feels much better wants to go home today.   Objective:   Blood pressure 188/102, pulse 77, temperature 98.8 F (37.1 C), temperature source Oral, resp. rate 18, height 6' 3"  (1.905 m), weight 159.984 kg (352 lb 11.2 oz), SpO2 98.00%.  Intake/Output Summary (Last 24 hours) at 01/19/14 1221 Last data filed at 01/19/14 1001  Gross per 24 hour  Intake 707.34 ml  Output   1950 ml  Net -1242.66 ml   Filed Weights   01/08/14 1422  Weight: 159.984 kg (352 lb 11.2 oz)    Exam Awake Alert, Oriented *3, No new F.N deficits, Normal affect [place].AT,PERRAL Supple Neck,No JVD, No cervical lymphadenopathy appriciated.  Symmetrical Chest wall movement, Good air movement bilaterally, CTAB RRR,No Gallops,Rubs or new Murmurs, No Parasternal Heave +ve B.Sounds, Abd Soft, Non tender, No organomegaly appriciated, No rebound -guarding or rigidity. No Cyanosis, Clubbing or edema, No new Rash or bruise  DISCHARGE CONDITION: Stable  DISPOSITION: Home with home health services  DISCHARGE INSTRUCTIONS:    Activity:  As tolerated with Full fall precautions use walker/cane & assistance as needed. Non-weightbearing on right foot  Diet recommendation: Diabetic Diet Heart Healthy diet   Discharge Orders   Future Orders Complete By Expires   Call MD for:  redness, tenderness, or signs of infection (pain, swelling, redness, odor or green/yellow discharge around incision site)  As directed    Call MD for:  temperature >100.4  As directed    Diet - low sodium heart healthy  As directed    Diet general  As directed    Discharge instructions  As directed    Scheduling Instructions:     Nonweightbearing right foot. Keep foot elevated with the heart at all times. Wash foot with soap and water daily. Applied dry dressing daily.   Increase activity slowly  As directed       Follow-up Information   Follow up with Newt Minion, MD On 01/29/2014. (Appointment with Dr. Sharol Given is at 08:30)     Specialty:  Orthopedic Surgery   Contact information:   [place]  [id number] [phone]       Follow up with Shirline Frees, MD. Schedule an appointment as soon as possible for a visit on 01/26/2014. (appointment with Dr. Kenton Kingfisher is at 2:30)    Specialty:  Family Medicine   Contact information:   [address] [place] [address] [phone]         Total Time spent on discharge equals 45 minutes.  SignedOren Binet 01/19/2014 12:21 PM

## 2014-01-19 NOTE — Progress Notes (Signed)
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   PT Comments   Pt continues to need assist and cues for safety using RW and during gait.  He is due to d/c home today and his new RW is in his room.  Pt will have family's assist and is still very appropriate for HHPT f/u at discharge.    Follow Up Recommendations  Home health PT;Supervision/Assistance - 24 hour;Supervision for mobility/OOB     Does the patient have the potential to tolerate intense rehabilitation    NA  Barriers to Discharge   1 STE      Equipment Recommendations  Rolling walker with 5" wheels    Recommendations for Other Services   NA  Frequency Min 5X/week   Progress towards PT Goals Progress towards PT goals: Progressing toward goals  Plan Current plan remains appropriate    Precautions / Restrictions Precautions Precautions: Fall Precaution Comments: due to NWB status of right foot Required Braces or Orthoses: Other Brace/Splint Other Brace/Splint: DARCO shoe Restrictions RLE Weight Bearing: Non weight bearing Other Position/Activity Restrictions: per MD note; pt can be TDWB through Rt heel if needed    Pertinent Vitals/Pain See vitals flow sheet.     Mobility  Bed Mobility Overal bed mobility: Modified Independent General bed mobility comments: used railing with HOB elevated to get up  Transfers Overall transfer level: Needs assistance Equipment used: Rolling walker (2 wheeled) Transfers: Sit to/from Stand Sit to Stand: Min assist General transfer comment: Min assist to stabilize RW and pt during transitions.  Verbal cues for safe hand placement and safe foot placement.   Ambulation/Gait Ambulation/Gait assistance: Min guard Ambulation Distance (Feet): 40 Feet (20' x 2 with seated rest breaks) Assistive device: Rolling walker (2 wheeled) Gait Pattern/deviations: Step-to pattern;Trunk flexed (hop to) Gait velocity: decreased Gait velocity interpretation: <1.8 ft/sec, indicative of risk for recurrent falls General Gait Details: Verbal cues for safest foot placement (pt started with right foot held behind him hopping with tes pointed to the floor, cues given to bring foot in front of him and if needed lightly touch the heel of the [facility] shoe to the ground).   Stairs: Yes General stair comments: Verbally reviewed pt's strategy to enter his home.  He reports he is going to back up to the step with the RW and someone is going to take the [facility] to the top of the step and he is just going to sit down in the [facility] (the pt is tall enough that I think this may work).        PT Goals (current goals can now be found in the care plan section) Acute Rehab PT Goals Patient Stated Goal: to go home today  Visit Information  History of Present Illness: Patient is a 48 y/o male admitted with right foot drainage x 1 month, now s/p transmetatarsal amputation 01/15/14.    Subjective Data  Subjective: Pt reports his right arm bothers him more than his right foot (had PICC line placed earlier Patient Stated Goal: to go home today   Cognition  Cognition Arousal/Alertness: Awake/alert Behavior During Therapy: WFL for tasks assessed/performed Overall Cognitive Status: Within Functional Limits for tasks assessed    Balance  Balance  Overall balance assessment: Needs assistance Sitting-balance support: Bilateral upper extremity supported;Feet supported Sitting balance-Leahy Scale: Good Standing balance support: Bilateral upper extremity supported Standing balance-Leahy Scale: Poor  End of Session PT - End of Session Activity Tolerance: Patient limited by fatigue Patient left: in  chair;with call bell/phone within reach     KernvilleRebecca B. Cedar Ditullio, PT, DPT [phone]   01/19/2014, 4:50 PM

## 2014-01-19 NOTE — Progress Notes (Signed)
ANTICOAGULATION CONSULT NOTE - Follow up Consult  Pharmacy Consult for warfarin Indication: VTE prophylaxis  Allergies  Allergen Reactions  . Minocycline Anaphylaxis  . Shellfish Allergy Nausea And Vomiting    Patient Measurements: Height: 6\' 3"  (190.5 cm) Weight: 352 lb 11.2 oz (159.984 kg) IBW/kg (Calculated) : 84.5   Vital Signs: Temp: 98.8 F (37.1 C) (02/17 0433) Temp src: Oral (02/17 0433) BP: 188/102 mmHg (02/17 1031) Pulse Rate: 77 (02/17 0915)  Labs:  Recent Labs  01/17/14 0820 01/18/14 0709 01/19/14 0536  LABPROT 14.8 16.8* 17.3*  INR 1.19 1.40 1.45  CREATININE 2.18* 2.26* 2.13*    Estimated Creatinine Clearance: 71.1 ml/min (by C-G formula based on Cr of 2.13).   Medical History: Past Medical History  Diagnosis Date  . Hypertension   . Diabetes mellitus   . Diverticulitis   . Cellulitis and abscess of leg 01/08/2014    RT LEG    Assessment: 47 YOM with necrotizing fasciitis of right foot now s/p transmetatarsal amputation. He is on warfarin for VTE prophylaxis. Baseline INR 1.45, trending up slightly. No new cbc, no bleeding noted per chart. Plan for discharge home today.  Goal of Therapy:  INR 2-3 Monitor platelets by anticoagulation protocol: Yes   Plan:  - Recommend coumadin 10mg  daily and INR check later this week. - Daily PT/INR - Follow for s/s bleeding  Bayard HuggerMei Omaria Plunk, PharmD, BCPS  Clinical Pharmacist  Pager: [phone]

## 2014-02-26 ENCOUNTER — Other Ambulatory Visit: Payer: Self-pay | Admitting: Internal Medicine

## 2014-07-29 ENCOUNTER — Institutional Professional Consult (permissible substitution): Payer: Managed Care, Other (non HMO) | Admitting: Cardiology

## 2014-08-26 ENCOUNTER — Encounter: Payer: Self-pay | Admitting: Cardiology

## 2014-08-26 ENCOUNTER — Ambulatory Visit (INDEPENDENT_AMBULATORY_CARE_PROVIDER_SITE_OTHER): Payer: Managed Care, Other (non HMO) | Admitting: Cardiology

## 2014-08-26 VITALS — BP 160/110 | HR 74 | Ht 75.0 in | Wt 327.0 lb

## 2014-08-26 DIAGNOSIS — R0600 Dyspnea, unspecified: Secondary | ICD-10-CM | POA: Insufficient documentation

## 2014-08-26 DIAGNOSIS — I10 Essential (primary) hypertension: Secondary | ICD-10-CM

## 2014-08-26 DIAGNOSIS — I517 Cardiomegaly: Secondary | ICD-10-CM

## 2014-08-26 DIAGNOSIS — R0609 Other forms of dyspnea: Secondary | ICD-10-CM | POA: Insufficient documentation

## 2014-08-26 DIAGNOSIS — R9431 Abnormal electrocardiogram [ECG] [EKG]: Secondary | ICD-10-CM

## 2014-08-26 DIAGNOSIS — R0989 Other specified symptoms and signs involving the circulatory and respiratory systems: Secondary | ICD-10-CM

## 2014-08-26 MED ORDER — ISOSORBIDE MONONITRATE ER 30 MG PO TB24: 30.0000 mg | ORAL_TABLET | Freq: Every day | ORAL | Status: DC

## 2014-08-26 NOTE — Progress Notes (Signed)
Patient ID: [name] [name], male   DOB: September 01, 1966, 48 y.o.   MRN: [id number]    Patient Name: [name] [name] Date of Encounter: 08/26/2014  Primary Care Provider:  [name], MD Primary Cardiologist: [name] [name]  Problem List   Past Medical History  Diagnosis Date  . Hypertension   . Diabetes mellitus   . Diverticulitis   . Cellulitis and abscess of leg 01/08/2014    RT LEG   Past Surgical History  Procedure Laterality Date  . Colon surgery    . I&d extremity Right 01/09/2014    Procedure: IRRIGATION AND DEBRIDEMENT EXTREMITY;  Surgeon: [name] Lange, MD;  Location: [facility] OR;  Service: Orthopedics;  Laterality: Right;  . Transmetatarsal amputation Right 01/15/2014    Procedure: TRANSMETATARSAL AMPUTATION;  Surgeon: [name] Mustard, MD;  Location: [facility] OR;  Service: Orthopedics;  Laterality: Right;    Allergies  Allergies  Allergen Reactions  . Minocycline Anaphylaxis  . Shellfish Allergy Nausea And Vomiting    HPI  A pleasant 48 year old male with h/o morbid obesity, hypertension and insulin dependent DM who was referred to [place] for hypertension that is difficult to control. The patient underwent surgery for right foot osteomyelitis earlier this year and has been minimally active since then. He has noticed weight gain and mild worsening dyspnea on exertion since then. He denies any chest pain. He is very complaint with his meds but states that clonidine makes his tired and only takes it at night. No palpitations, syncope, no orthopnea, PND.   Home Medications  Prior to Admission medications   Medication Sig Start Date End Date Taking? Authorizing Provider  amLODipine (NORVASC) 10 MG tablet Take 1 tablet (10 mg total) by mouth daily. 01/19/14  Yes [name] Levora Dredge, MD  carvedilol (COREG) 25 MG tablet Take 1 tablet (25 mg total) by mouth 2 (two) times daily with a meal. 01/19/14  Yes [name] Levora Dredge, MD  cloNIDine (CATAPRES) 0.2 MG tablet Take 0.2 mg by mouth 2  (two) times daily.   Yes Historical Provider, MD  Dextromethorphan-Guaifenesin (GUAIATUSSIN DM PO) Take by mouth. Municex DM take 1 tablet by mouth every 12 hours   Yes Historical Provider, MD  doxazosin (CARDURA) 8 MG tablet Take 8 mg by mouth daily.   Yes Historical Provider, MD  fexofenadine (ALLEGRA) 180 MG tablet Take 180 mg by mouth daily.   Yes Historical Provider, MD  hydrALAZINE (APRESOLINE) 100 MG tablet Take 1 tablet (100 mg total) by mouth 3 (three) times daily. 01/19/14  Yes [name] Levora Dredge, MD  hydrochlorothiazide (HYDRODIURIL) 25 MG tablet Take 25 mg by mouth daily.  08/16/14  Yes Historical Provider, MD  Insulin Detemir (LEVEMIR) 100 UNIT/ML Pen Inject 22 Units into the skin daily at 10 pm. 01/19/14  Yes [name] Levora Dredge, MD  metFORMIN (GLUCOPHAGE-XR) 500 MG 24 hr tablet Take 1,000 mg by mouth daily with breakfast.   Yes Historical Provider, MD    Family History  Family History  Problem Relation Age of Onset  . Hypertension Mother   . Diabetes Mellitus II Father     Social History  History   Social History  . Marital Status: Married    Spouse Name: N/A    Number of Children: N/A  . Years of Education: N/A   Occupational History  . Not on file.   Social History Main Topics  . Smoking status: Never Smoker   . Smokeless tobacco: Never Used  . Alcohol Use:  Yes     Comment: SOCIAL  . Drug Use: No  . Sexual Activity: Yes   Other Topics Concern  . Not on file   Social History Narrative  . No narrative on file     Review of Systems, as per HPI, otherwise negative General:  No chills, fever, night sweats or weight changes.  Cardiovascular:  No chest pain, dyspnea on exertion, edema, orthopnea, palpitations, paroxysmal nocturnal dyspnea. Dermatological: No rash, lesions/masses Respiratory: No cough, dyspnea Urologic: No hematuria, dysuria Abdominal:   No nausea, vomiting, diarrhea, bright red blood per rectum, melena, or hematemesis Neurologic:  No visual  changes, wkns, changes in mental status. All other systems reviewed and are otherwise negative except as noted above.  Physical Exam  Height  (1.905 m), weight 327 lb (148.326 kg).  General: Pleasant, NAD Psych: Normal affect. Neuro: Alert and oriented X 3. Moves all extremities spontaneously. HEENT: Normal  Neck: Supple without bruits or JVD. Lungs:  Resp regular and unlabored, CTA. Heart: RRR no s3, s4, or murmurs. Abdomen: Soft, non-tender, non-distended, BS + x 4.  Extremities: No clubbing, cyanosis or edema. DP/PT/Radials 2+ and equal bilaterally.  Labs:  No results found for this basename: CKTOTAL, CKMB, TROPONINI,  in the last 72 hours Lab Results  Component Value Date   WBC 16.5* 01/16/2014   HGB 9.8* 01/16/2014   HCT 30.1* 01/16/2014   MCV 80.1 01/16/2014   PLT 357 01/16/2014    No results found for this basename: DDIMER   No components found with this basename: POCBNP,     Component Value Date/Time   NA 139 01/19/2014 0536   K 3.8 01/19/2014 0536   CL 103 01/19/2014 0536   CO2 24 01/19/2014 0536   GLUCOSE 136* 01/19/2014 0536   BUN 8 01/19/2014 0536   CREATININE 2.13* 01/19/2014 0536   CALCIUM 8.7 01/19/2014 0536   PROT 7.1 01/09/2014 0546   ALBUMIN 1.9* 01/09/2014 0546   AST 12 01/09/2014 0546   ALT 11 01/09/2014 0546   ALKPHOS 103 01/09/2014 0546   BILITOT 0.5 01/09/2014 0546   GFRNONAA 36* 01/19/2014 0536   GFRAA 41* 01/19/2014 0536   No results found for this basename: CHOL, HDL, LDLCALC, TRIG    Accessory Clinical Findings  echocardiogram  ECG - SR LVH, negative T waves in the inferolateral leads    Assessment & Plan  48 year old male   1. Hypertension - uncontrolled despite multiple medication regimen, we will add imdur 30 mg po daily. Order echocardiogram to evaluate for systolic and diastolic function and degree of LVH.  2. DOE - with significant changes on ECG that might represent repolarization abnormalities with LVH but also possible ischemia.  We  will order a Lexiscan nuclear stress test.  3. Lipids - followed by PCP  4. Obesity - if stress test negative and foot healed he will need to restart exercise. He seems to be motivated.   5. CKD stage 3 - most probably secondary to HTN, he is supposed to see a nephrologist  Follow up in 1 month  Laine Giovanetti, Faustino Congress, MD, [facility] 08/26/2014, 2:55 PM

## 2014-08-26 NOTE — Patient Instructions (Signed)
Your physician has recommended you make the following change in your medication:   START TAKING IMDUR 30 MG ONCE DAILY    Your physician has requested that you have an echocardiogram. Echocardiography is a painless test that uses sound waves to create images of your heart. It provides your doctor with information about the size and shape of your heart and how well your heart's chambers and valves are working. This procedure takes approximately one hour. There are no restrictions for this procedure.   Your physician has requested that you have a lexiscan myoview. For further information please visit [email or web address] Please follow instruction sheet, as given.   Your physician recommends that you schedule a follow-up appointment in: SCHEDULE AN APPOINTMENT WITH DR Delton See AFTER YOUR TEST ARE COMPLETE

## 2014-09-15 ENCOUNTER — Ambulatory Visit (HOSPITAL_BASED_OUTPATIENT_CLINIC_OR_DEPARTMENT_OTHER): Payer: Managed Care, Other (non HMO) | Admitting: Radiology

## 2014-09-15 ENCOUNTER — Ambulatory Visit (HOSPITAL_COMMUNITY): Payer: Managed Care, Other (non HMO) | Attending: Cardiovascular Disease | Admitting: Cardiology

## 2014-09-15 ENCOUNTER — Encounter (HOSPITAL_COMMUNITY): Payer: Managed Care, Other (non HMO)

## 2014-09-15 VITALS — BP 201/108 | Ht 75.0 in | Wt 320.0 lb

## 2014-09-15 DIAGNOSIS — I10 Essential (primary) hypertension: Secondary | ICD-10-CM | POA: Insufficient documentation

## 2014-09-15 DIAGNOSIS — R9431 Abnormal electrocardiogram [ECG] [EKG]: Secondary | ICD-10-CM

## 2014-09-15 DIAGNOSIS — I517 Cardiomegaly: Secondary | ICD-10-CM | POA: Diagnosis present

## 2014-09-15 DIAGNOSIS — E119 Type 2 diabetes mellitus without complications: Secondary | ICD-10-CM | POA: Insufficient documentation

## 2014-09-15 DIAGNOSIS — E785 Hyperlipidemia, unspecified: Secondary | ICD-10-CM | POA: Insufficient documentation

## 2014-09-15 DIAGNOSIS — R0609 Other forms of dyspnea: Secondary | ICD-10-CM

## 2014-09-15 MED ORDER — TECHNETIUM TC 99M SESTAMIBI GENERIC - CARDIOLITE
33.0000 | Freq: Once | INTRAVENOUS | Status: AC | PRN
  Administered 2014-09-15: 33 via INTRAVENOUS

## 2014-09-15 MED ORDER — REGADENOSON 0.4 MG/5ML IV SOLN
0.4000 mg | Freq: Once | INTRAVENOUS | Status: AC
  Administered 2014-09-15: 0.4 mg via INTRAVENOUS

## 2014-09-15 NOTE — Progress Notes (Signed)
Echo performed. 

## 2014-09-15 NOTE — Progress Notes (Signed)
[facility] SITE [facility] NUCLEAR MED [address] , [place] [address] [phone]    Cardiology Nuclear Med Study  Loleta ChanceReginald F Morell is a 48 y.o. male     MRN : [id number]     DOB: 10-02-1966  Procedure Date: 09/15/2014  Nuclear Med Background Indication for Stress Test:  Evaluation for Ischemia History:  No Cardiac History Cardiac Risk Factors: Hypertension and IDDM   Symptoms:  DOE   Nuclear Pre-Procedure Caffeine/Decaff Intake:  None> 12 hrs NPO After: 0815am   Lungs:  clear O2 Sat: 97% on room air. IV 0.9% NS with Angio Cath:  22g  IV Site: R Hand x 1, tolerated well IV Started by:  Irean HongPatsy Edwards, RN  Chest Size (in):  52 Cup Size: n/a  Height: 6\' 3"  (1.905 m)  Weight:  320 lb (145.151 kg)  BMI:  Body mass index is 40 kg/(m^2). Tech Comments:  Full dose Levemir insulin last night, no insulin today. Fasting CBG was 155 at 0750 today. No Metformin or Coreg this am. Irean HongPatsy Edwards, RN.    Nuclear Med Study 1 or 2 day study: 2 day  Stress Test Type:  Eugenie BirksLexiscan  Reading MD: N/A  Order Authorizing Provider:  Tobias AlexanderKatarina Nelson, MD  Resting Radionuclide: Technetium 1760m Sestamibi  Resting Radionuclide Dose: 33.0 mCi  On      09-20-14  Stress Radionuclide:  Technetium 4060m Sestamibi  Stress Radionuclide Dose: 33.0 mCi  On         09-15-14          Stress Protocol Rest HR: 85 Stress HR: 95  Rest BP: 201/108 Stress BP: 185/93  Exercise Time (min): n/a METS: n/a   Predicted Max HR: 174 bpm % Max HR: 54.6 bpm Rate Pressure Product: [id number]   Dose of Adenosine (mg):  n/a Dose of Lexiscan: 0.4 mg  Dose of Atropine (mg): n/a Dose of Dobutamine: n/a mcg/kg/min (at max HR)  Stress Test Technologist: Bonnita LevanJackie Smith, RN  Nuclear Technologist:  Jackquline BoschElzbieta Kubak,CNMT     Rest Procedure:  Myocardial perfusion imaging was performed at rest 45 minutes following the intravenous administration of Technetium 5260m Sestamibi. Rest ECG:  SR 85  Septal MI  T wave inversion Lateral leads    Stress Procedure:  The patient received IV Lexiscan 0.4 mg over 15-seconds.  Technetium 4360m Sestamibi injected at 30-seconds.  Quantitative spect images were obtained after a 45 minute delay. Stress ECG: No significant ST segment change suggestive of ischemia.  QPS Raw Data Images: Soft tissue (diaphragm, bowel activity, subcutaneous fat) surround  heart.  Stress Images:  LV is dilated.  There is a moderate area of thinning with decreased tracer activity in the inferior wall (base,mid), inferolateral wall (base, mid, minimally distal) Rest Images:  Comparison with the stress images reveals no significant change. Subtraction (SDS):  No evidence of ischemia. Transient Ischemic Dilatation (Normal <1.22):  0.92 Lung/Heart Ratio (Normal <0.45):  0.27  Quantitative Gated Spect Images QGS EDV:  238 ml QGS ESV:  149 ml  Impression Exercise Capacity:  Lexiscan with no exercise. BP Response:  Normal blood pressure response. Clinical Symptoms:  No symptoms. ECG Impression:  No significant ST segment change suggestive of ischemia. Comparison with Prior Nuclear Study: No images to compare  Overall Impression: Moderate area of scare and/or soft tissue attenuation in the inferior and inferolateral walls.  No ischemia.    LV Ejection Fraction: 37%.  LV Wall Motion:  Diffuse hypokinesis.    Dietrich PatesPaula Domingos Riggi

## 2014-09-20 ENCOUNTER — Ambulatory Visit (HOSPITAL_COMMUNITY): Payer: Managed Care, Other (non HMO) | Attending: Cardiology

## 2014-09-20 DIAGNOSIS — R0989 Other specified symptoms and signs involving the circulatory and respiratory systems: Secondary | ICD-10-CM

## 2014-09-20 MED ORDER — TECHNETIUM TC 99M SESTAMIBI GENERIC - CARDIOLITE
30.0000 | Freq: Once | INTRAVENOUS | Status: AC | PRN
  Administered 2014-09-20: 30 via INTRAVENOUS

## 2014-09-29 ENCOUNTER — Ambulatory Visit: Payer: Managed Care, Other (non HMO) | Admitting: Cardiology

## 2014-10-07 ENCOUNTER — Ambulatory Visit (INDEPENDENT_AMBULATORY_CARE_PROVIDER_SITE_OTHER): Payer: Managed Care, Other (non HMO) | Admitting: Cardiology

## 2014-10-07 ENCOUNTER — Encounter: Payer: Self-pay | Admitting: Cardiology

## 2014-10-07 MED ORDER — ISOSORBIDE MONONITRATE ER 60 MG PO TB24: 60.0000 mg | ORAL_TABLET | Freq: Every day | ORAL | Status: DC

## 2014-10-07 NOTE — Patient Instructions (Signed)
Your physician has recommended you make the following change in your medication:   INCREASE YOUR IMDUR TO 60 MG DAILY   DR NELSON WOULD LIKE FOR YOU TO COME IN FOR A NURSE VISIT TO CHECK YOUR BP IN 2 MONTHS   Your physician wants you to follow-up in: 4 MONTHS WITH DR Delton SeeNELSON we will send you a reminder letter in the mail two months in advance. If you don't receive a letter, please call our office to schedule the follow-up appointment.

## 2014-10-07 NOTE — Progress Notes (Signed)
Patient ID: RYZEN DEADY, male   DOB: [birth year], 48 y.o.   MRN: [id number]    Patient Name: [name] [name] Date of Encounter: 10/07/2014  Primary Care Provider:  [name], MD Primary Cardiologist: Lars Masson  Problem List   Past Medical History  Diagnosis Date  . Hypertension   . Diabetes mellitus   . Diverticulitis   . Cellulitis and abscess of leg 01/08/2014    RT LEG   Past Surgical History  Procedure Laterality Date  . Colon surgery    . I&d extremity Right 01/09/2014    Procedure: IRRIGATION AND DEBRIDEMENT EXTREMITY;  Surgeon: Senaida Lange, MD;  Location: [facility] OR;  Service: Orthopedics;  Laterality: Right;  . Transmetatarsal amputation Right 01/15/2014    Procedure: TRANSMETATARSAL AMPUTATION;  Surgeon: Nadara Mustard, MD;  Location: [facility] OR;  Service: Orthopedics;  Laterality: Right;    Allergies  Allergies  Allergen Reactions  . Minocycline Anaphylaxis  . Shellfish Allergy Nausea And Vomiting    HPI  A pleasant 49 year old male with h/o morbid obesity, hypertension and insulin dependent DM who was referred to [place] for hypertension that is difficult to control. The patient underwent surgery for right foot osteomyelitis earlier this year and has been minimally active since then. He has noticed weight gain and mild worsening dyspnea on exertion since then. He denies any chest pain. He is very complaint with his meds but states that clonidine makes his tired and only takes it at night. No palpitations, syncope, no orthopnea, PND.  10/06/2014 - the patient is coming after 1 month, he feels the same, stable SOB. He underwent regadenoson on nuclear stress test that showed inferolateral scar, however I believe this is diaphragmatic attenuation secondary to patient size. His echocardiogram showed severe concentric hypertrophy with low normal LV EF 50-55%, mildly to moderately enlarged left atrium. The patient is motivated to start exercising and is about proper  type of exercise.  Home Medications  Prior to Admission medications   Medication Sig Start Date End Date Taking? Authorizing Provider  amLODipine (NORVASC) 10 MG tablet Take 1 tablet (10 mg total) by mouth daily. 01/19/14  Yes Shanker Levora Dredge, MD  carvedilol (COREG) 25 MG tablet Take 1 tablet (25 mg total) by mouth 2 (two) times daily with a meal. 01/19/14  Yes Shanker Levora Dredge, MD  cloNIDine (CATAPRES) 0.2 MG tablet Take 0.2 mg by mouth 2 (two) times daily.   Yes Historical Provider, MD  Dextromethorphan-Guaifenesin (GUAIATUSSIN DM PO) Take by mouth. Municex DM take 1 tablet by mouth every 12 hours   Yes Historical Provider, MD  doxazosin (CARDURA) 8 MG tablet Take 8 mg by mouth daily.   Yes Historical Provider, MD  fexofenadine (ALLEGRA) 180 MG tablet Take 180 mg by mouth daily.   Yes Historical Provider, MD  hydrALAZINE (APRESOLINE) 100 MG tablet Take 1 tablet (100 mg total) by mouth 3 (three) times daily. 01/19/14  Yes Shanker Levora Dredge, MD  hydrochlorothiazide (HYDRODIURIL) 25 MG tablet Take 25 mg by mouth daily.  08/16/14  Yes Historical Provider, MD  Insulin Detemir (LEVEMIR) 100 UNIT/ML Pen Inject 22 Units into the skin daily at 10 pm. 01/19/14  Yes Shanker Levora Dredge, MD  metFORMIN (GLUCOPHAGE-XR) 500 MG 24 hr tablet Take 1,000 mg by mouth daily with breakfast.   Yes Historical Provider, MD    Family History  Family History  Problem Relation Age of Onset  . Hypertension Mother   . Diabetes Mellitus II  Father     Social History  History   Social History  . Marital Status: Married    Spouse Name: N/A    Number of Children: N/A  . Years of Education: N/A   Occupational History  . Not on file.   Social History Main Topics  . Smoking status: Never Smoker   . Smokeless tobacco: Never Used  . Alcohol Use: Yes     Comment: SOCIAL  . Drug Use: No  . Sexual Activity: Yes   Other Topics Concern  . Not on file   Social History Narrative  . No narrative on file    Review  of Systems, as per HPI, otherwise negative General:  No chills, fever, night sweats or weight changes.  Cardiovascular:  No chest pain, dyspnea on exertion, edema, orthopnea, palpitations, paroxysmal nocturnal dyspnea. Dermatological: No rash, lesions/masses Respiratory: No cough, dyspnea Urologic: No hematuria, dysuria Abdominal:   No nausea, vomiting, diarrhea, bright red blood per rectum, melena, or hematemesis Neurologic:  No visual changes, wkns, changes in mental status. All other systems reviewed and are otherwise negative except as noted above.  Physical Exam  BP: 168/92 mmHg, 75 BPM General: Pleasant, NAD Psych: Normal affect. Neuro: Alert and oriented X 3. Moves all extremities spontaneously. HEENT: Normal  Neck: Supple without bruits or JVD. Lungs:  Resp regular and unlabored, CTA. Heart: RRR no s3, s4, or murmurs. Abdomen: Soft, non-tender, non-distended, BS + x 4.  Extremities: No clubbing, cyanosis or edema. DP/PT/Radials 2+ and equal bilaterally.  Labs:  No results for input(s): CKTOTAL, CKMB, TROPONINI in the last 72 hours. Lab Results  Component Value Date   WBC 16.5* 01/16/2014   HGB 9.8* 01/16/2014   HCT 30.1* 01/16/2014   MCV 80.1 01/16/2014   PLT 357 01/16/2014    No results found for: DDIMER Invalid input(s): POCBNP    Component Value Date/Time   NA 139 01/19/2014 0536   K 3.8 01/19/2014 0536   CL 103 01/19/2014 0536   CO2 24 01/19/2014 0536   GLUCOSE 136* 01/19/2014 0536   BUN 8 01/19/2014 0536   CREATININE 2.13* 01/19/2014 0536   CALCIUM 8.7 01/19/2014 0536   PROT 7.1 01/09/2014 0546   ALBUMIN 1.9* 01/09/2014 0546   AST 12 01/09/2014 0546   ALT 11 01/09/2014 0546   ALKPHOS 103 01/09/2014 0546   BILITOT 0.5 01/09/2014 0546   GFRNONAA 36* 01/19/2014 0536   GFRAA 41* 01/19/2014 0536   No results found for: CHOL  Accessory Clinical Findings  Echocardiogram - 09/15/2014  Left ventricle: The cavity size was normal. Wall thickness  was increased in a pattern of severe LVH. Systolic function was normal. The estimated ejection fraction was in the range of 50% to 55%. Wall motion was normal; there were no regional wall motion abnormalities. Doppler parameters are consistent with abnormal left ventricular relaxation (grade 1 diastolic dysfunction). - Left atrium: The atrium was mildly to moderately dilated. - Right ventricle: The cavity size was mildly decreased.  ECG - SR LVH, negative T waves in the inferolateral leads   Lexiscan nuclear stress test:  09/15/2014  Quantitative Gated Spect Images QGS EDV: 238 ml QGS ESV: 149 ml  Impression Exercise Capacity: Lexiscan with no exercise. BP Response: Normal blood pressure response. Clinical Symptoms: No symptoms. ECG Impression: No significant ST segment change suggestive of ischemia. Comparison with Prior Nuclear Study: No images to compare  Overall Impression: Moderate area of scare and/or soft tissue attenuation in the inferior and inferolateral walls.  No ischemia.   LV Ejection Fraction: 37%. LV Wall Motion: Diffuse hypokinesis.     Assessment & Plan  48 year old male   1. Hypertension - uncontrolled despite multiple medication regimen, he didn't take all of his meds this am yet, we will increase Imdur to 60 mg po daily. Echocardiogram showed severe concentric LVH and grade 1 diastolic dysfunction with mildly to moderately enlarged left atrium.  2. DOE - no ischemia on stress test, this is to be attribute it to obesity, uncontrolled high blood pressure and deconditioning.  3. Obesity - stress test negative, he is advised to restart exercise. He is inquiring about proper type of exercise his advised that cardio is the best exercise for him right now in order to loose weight and get better condition. He can do limited lightweight weight lifting in order to prevent further worsening of LVH.  3. Lipids - followed by PCP  4. CKD stage  3 - most probably secondary to HTN, he is supposed to see a nephrologist  Follow up in 2 months for nursing visit for blood pressure, I will see him in 4 months.  Lars MassonNELSON, Meilech Virts H, MD, [facility] CenterFACC 10/07/2014, 9:08 AM

## 2015-02-11 ENCOUNTER — Ambulatory Visit: Payer: Managed Care, Other (non HMO) | Admitting: Cardiology

## 2015-05-30 ENCOUNTER — Other Ambulatory Visit: Payer: Self-pay

## 2015-07-31 ENCOUNTER — Encounter (HOSPITAL_BASED_OUTPATIENT_CLINIC_OR_DEPARTMENT_OTHER): Payer: Self-pay | Admitting: *Deleted

## 2015-07-31 ENCOUNTER — Emergency Department (HOSPITAL_BASED_OUTPATIENT_CLINIC_OR_DEPARTMENT_OTHER)
Admission: EM | Admit: 2015-07-31 | Discharge: 2015-07-31 | Disposition: A | Discharge status: Home / Self Care | Payer: Managed Care, Other (non HMO) | Attending: Emergency Medicine | Admitting: Emergency Medicine

## 2015-07-31 ENCOUNTER — Emergency Department (HOSPITAL_BASED_OUTPATIENT_CLINIC_OR_DEPARTMENT_OTHER): Payer: Managed Care, Other (non HMO)

## 2015-07-31 DIAGNOSIS — Z79899 Other long term (current) drug therapy: Secondary | ICD-10-CM | POA: Diagnosis not present

## 2015-07-31 DIAGNOSIS — N200 Calculus of kidney: Secondary | ICD-10-CM | POA: Diagnosis not present

## 2015-07-31 DIAGNOSIS — Z7951 Long term (current) use of inhaled steroids: Secondary | ICD-10-CM | POA: Diagnosis not present

## 2015-07-31 DIAGNOSIS — E119 Type 2 diabetes mellitus without complications: Secondary | ICD-10-CM | POA: Diagnosis not present

## 2015-07-31 DIAGNOSIS — I10 Essential (primary) hypertension: Secondary | ICD-10-CM | POA: Diagnosis not present

## 2015-07-31 DIAGNOSIS — Z794 Long term (current) use of insulin: Secondary | ICD-10-CM | POA: Insufficient documentation

## 2015-07-31 DIAGNOSIS — Z8719 Personal history of other diseases of the digestive system: Secondary | ICD-10-CM | POA: Insufficient documentation

## 2015-07-31 DIAGNOSIS — R109 Unspecified abdominal pain: Secondary | ICD-10-CM | POA: Diagnosis present

## 2015-07-31 LAB — COMPREHENSIVE METABOLIC PANEL
ALBUMIN: 3.6 g/dL (ref 3.5–5.0)
ALT: 17 U/L (ref 17–63)
ANION GAP: 8 (ref 5–15)
AST: 18 U/L (ref 15–41)
Alkaline Phosphatase: 54 U/L (ref 38–126)
BUN: 24 mg/dL — ABNORMAL HIGH (ref 6–20)
CHLORIDE: 107 mmol/L (ref 101–111)
CO2: 22 mmol/L (ref 22–32)
Calcium: 9.1 mg/dL (ref 8.9–10.3)
Creatinine, Ser: 1.43 mg/dL — ABNORMAL HIGH (ref 0.61–1.24)
GFR calc Af Amer: >60 mL/min (ref >60)
GFR calc non Af Amer: 57 mL/min — ABNORMAL LOW (ref >60)
Glucose, Bld: 199 mg/dL — ABNORMAL HIGH (ref 65–99)
Potassium: 4.1 mmol/L (ref 3.5–5.1)
Sodium: 137 mmol/L (ref 135–145)
TOTAL PROTEIN: 7.2 g/dL (ref 6.5–8.1)
Total Bilirubin: 0.5 mg/dL (ref 0.3–1.2)

## 2015-07-31 LAB — URINALYSIS, ROUTINE W REFLEX MICROSCOPIC
Bilirubin Urine: NEGATIVE
GLUCOSE, UA: 100 mg/dL — AB
KETONES UR: NEGATIVE mg/dL
Leukocytes, UA: NEGATIVE
NITRITE: NEGATIVE
Specific Gravity, Urine: 1.015 (ref 1.005–1.030)
Urobilinogen, UA: 0.2 mg/dL (ref 0.0–1.0)
pH: 5.5 (ref 5.0–8.0)

## 2015-07-31 LAB — CBC WITH DIFFERENTIAL/PLATELET
BASOS PCT: 0 % (ref 0–1)
Basophils Absolute: 0 10*3/uL (ref 0.0–0.1)
EOS ABS: 0 10*3/uL (ref 0.0–0.7)
Eosinophils Relative: 0 % (ref 0–5)
HCT: 34.7 % — ABNORMAL LOW (ref 39.0–52.0)
Hemoglobin: 11.4 g/dL — ABNORMAL LOW (ref 13.0–17.0)
Lymphocytes Relative: 12 % (ref 12–46)
Lymphs Abs: 0.8 10*3/uL (ref 0.7–4.0)
MCH: 26.7 pg (ref 26.0–34.0)
MCHC: 32.9 g/dL (ref 30.0–36.0)
MCV: 81.3 fL (ref 78.0–100.0)
MONOS PCT: 6 % (ref 3–12)
Monocytes Absolute: 0.4 10*3/uL (ref 0.1–1.0)
NEUTROS PCT: 82 % — AB (ref 43–77)
Neutro Abs: 5.6 10*3/uL (ref 1.7–7.7)
Platelets: 220 10*3/uL (ref 150–400)
RBC: 4.27 MIL/uL (ref 4.22–5.81)
RDW: 14.1 % (ref 11.5–15.5)
WBC: 6.9 10*3/uL (ref 4.0–10.5)

## 2015-07-31 LAB — URINE MICROSCOPIC-ADD ON

## 2015-07-31 LAB — LIPASE, BLOOD: Lipase: 18 U/L — ABNORMAL LOW (ref 22–51)

## 2015-07-31 MED ORDER — SODIUM CHLORIDE 0.9 % IV SOLN
1000.0000 mL | INTRAVENOUS | Status: DC
  Administered 2015-07-31: 1000 mL via INTRAVENOUS

## 2015-07-31 MED ORDER — SODIUM CHLORIDE 0.9 % IV SOLN: 1000.0000 mL | Freq: Once | INTRAVENOUS | Status: DC

## 2015-07-31 MED ORDER — HYDROMORPHONE HCL 1 MG/ML IJ SOLN
1.0000 mg | Freq: Once | INTRAMUSCULAR | Status: AC
  Administered 2015-07-31: 1 mg via INTRAVENOUS
  Filled 2015-07-31: qty 1

## 2015-07-31 MED ORDER — ONDANSETRON HCL 4 MG/2ML IJ SOLN
4.0000 mg | Freq: Once | INTRAMUSCULAR | Status: AC
  Administered 2015-07-31: 4 mg via INTRAVENOUS
  Filled 2015-07-31: qty 2

## 2015-07-31 MED ORDER — AMLODIPINE BESYLATE 5 MG PO TABS
5.0000 mg | ORAL_TABLET | Freq: Once | ORAL | Status: AC
  Administered 2015-07-31: 5 mg via ORAL
  Filled 2015-07-31: qty 1

## 2015-07-31 MED ORDER — KETOROLAC TROMETHAMINE 60 MG/2ML IM SOLN
60.0000 mg | Freq: Once | INTRAMUSCULAR | Status: AC
  Administered 2015-07-31: 60 mg via INTRAMUSCULAR
  Filled 2015-07-31: qty 2

## 2015-07-31 MED ORDER — OXYCODONE-ACETAMINOPHEN 5-325 MG PO TABS
2.0000 | ORAL_TABLET | Freq: Once | ORAL | Status: AC
  Administered 2015-07-31: 2 via ORAL
  Filled 2015-07-31: qty 2

## 2015-07-31 MED ORDER — CLONIDINE HCL 0.1 MG PO TABS
0.2000 mg | ORAL_TABLET | Freq: Once | ORAL | Status: AC
  Administered 2015-07-31: 0.2 mg via ORAL
  Filled 2015-07-31: qty 2

## 2015-07-31 MED ORDER — OXYCODONE-ACETAMINOPHEN 5-325 MG PO TABS: 1.0000 | ORAL_TABLET | Freq: Four times a day (QID) | ORAL | Status: DC | PRN

## 2015-07-31 NOTE — ED Provider Notes (Signed)
CSN: [id number]     Arrival date & time 07/31/15  1357 History   First MD Initiated Contact with Patient 07/31/15 1436     Chief Complaint  Patient presents with  . Flank Pain     (Consider location/radiation/quality/duration/timing/severity/associated sxs/prior Treatment) HPI  Patient is a 49 y.o. male PMH significant for HTN, DM, and diverticulitis who presents with a sudden onset of severe sharp constant right flank pain that is made worse lying down and nothing makes it better and associated nausea.  Denies fever, chills, vomiting, abdominal pain, CP, or SOB.  Patient does not have a history of kidney stones, and nothing like this has ever happened before.  Patient has history of abdominal surgery in the 90s.  Patient states that he took his BP meds this AM, but threw up immediately after.   Past Medical History  Diagnosis Date  . Hypertension   . Diabetes mellitus   . Diverticulitis   . Cellulitis and abscess of leg 01/08/2014    RT LEG   Past Surgical History  Procedure Laterality Date  . Colon surgery    . I&d extremity Right 01/09/2014    Procedure: IRRIGATION AND DEBRIDEMENT EXTREMITY;  Surgeon: Senaida Lange, MD;  Location: [facility] OR;  Service: Orthopedics;  Laterality: Right;  . Transmetatarsal amputation Right 01/15/2014    Procedure: TRANSMETATARSAL AMPUTATION;  Surgeon: Nadara Mustard, MD;  Location: [facility] OR;  Service: Orthopedics;  Laterality: Right;   Family History  Problem Relation Age of Onset  . Hypertension Mother   . Diabetes Mellitus II Father    Social History  Substance Use Topics  . Smoking status: Never Smoker   . Smokeless tobacco: Never Used  . Alcohol Use: No     Comment: SOCIAL    Review of Systems All other systems negative unless otherwise stated in HPI    Allergies  Minocycline and Shellfish allergy  Home Medications   Prior to Admission medications   Medication Sig Start Date End Date Taking? Authorizing Provider  amLODipine (NORVASC)  10 MG tablet Take 1 tablet (10 mg total) by mouth daily. 01/19/14  Yes Shanker Levora Dredge, MD  carvedilol (COREG) 25 MG tablet Take 1 tablet (25 mg total) by mouth 2 (two) times daily with a meal. 01/19/14  Yes Shanker Levora Dredge, MD  cloNIDine (CATAPRES) 0.2 MG tablet Take 0.2 mg by mouth 2 (two) times daily.   Yes Historical Provider, MD  Dextromethorphan-Guaifenesin (GUAIATUSSIN DM PO) Take by mouth. Municex DM take 1 tablet by mouth every 12 hours   Yes Historical Provider, MD  doxazosin (CARDURA) 8 MG tablet Take 8 mg by mouth daily.   Yes Historical Provider, MD  fexofenadine (ALLEGRA) 180 MG tablet Take 180 mg by mouth daily.   Yes Historical Provider, MD  fluticasone (FLONASE) 50 MCG/ACT nasal spray Place 1 spray into both nostrils daily.   Yes Historical Provider, MD  hydrALAZINE (APRESOLINE) 100 MG tablet Take 1 tablet (100 mg total) by mouth 3 (three) times daily. 01/19/14  Yes Shanker Levora Dredge, MD  hydrochlorothiazide (HYDRODIURIL) 25 MG tablet Take 25 mg by mouth daily.  08/16/14  Yes Historical Provider, MD  insulin aspart (NOVOLOG) 100 UNIT/ML injection Inject into the skin 3 (three) times daily before meals.   Yes Historical Provider, MD  Insulin Detemir (LEVEMIR) 100 UNIT/ML Pen Inject 22 Units into the skin daily at 10 pm. 01/19/14  Yes Shanker Levora Dredge, MD  metFORMIN (GLUCOPHAGE-XR) 500 MG 24 hr tablet Take 1,000  mg by mouth daily with breakfast.   Yes Historical Provider, MD  isosorbide mononitrate (IMDUR) 60 MG 24 hr tablet Take 1 tablet (60 mg total) by mouth daily. 10/07/14   Lars Masson, MD   BP 198/118 mmHg  Pulse 77  Temp(Src) 98.6 F (37 C) (Oral)  Resp 18  Ht 6\' 3"  (1.905 m)  Wt 343 lb (155.584 kg)  BMI 42.87 kg/m2  SpO2 100% Physical Exam  Constitutional: He is oriented to person, place, and time. He appears well-developed and well-nourished. He appears distressed.  Patient is visibly uncomfortable and shaking his leg and squirming in the bed.   Neck: Neck  supple.  Cardiovascular: Normal rate, regular rhythm and normal heart sounds.   Pulmonary/Chest: Effort normal and breath sounds normal.  Abdominal: Soft. Bowel sounds are normal. He exhibits no distension. There is no tenderness. There is no CVA tenderness.  Musculoskeletal:  Non-reproducible lower right flank pain.  Neurological: He is alert and oriented to person, place, and time.  Psychiatric: He has a normal mood and affect. His behavior is normal.    ED Course  Procedures (including critical care time) Labs Review Labs Reviewed  URINALYSIS, ROUTINE W REFLEX MICROSCOPIC (NOT AT [facility])    Imaging Review No results found. I have personally reviewed and evaluated these images and lab results as part of my medical decision-making.   EKG Interpretation None      MDM   Final diagnoses:  None   Pyelo vs SBO vs stone  Pt with sudden right flank pain.   BP 214/114.  Patient given home medications.  On exam, pt has non-reproducible lower right flank pain.  No abdominal tenderness, rebound, or guarding.  CT renal stone study shows 3mm right UVJ stone with mild hydronephrosis.   Will provide pain control and continue to monitor BP.  Suspect elevated BP secondary to pain and the fact he vomited his AM BP medications. Pt to follow up outpt with urology in 2 days.   Cheri Fowler, PA-C 07/31/15 1835  Rolland Porter, MD 08/04/15 [phone]

## 2015-07-31 NOTE — ED Notes (Signed)
Pt c/o right flank pain that began at 2:00 this morning. Pt denies dysuria. Pt c/o nausea but no V or D.

## 2015-07-31 NOTE — ED Notes (Signed)
Presents with rt flank pain, onset approx 1030 today, acute onset, describes as sharpe/ and dull. Denies having this type of pain before. C/o nausea

## 2015-07-31 NOTE — ED Notes (Signed)
Family at bedside. 

## 2015-07-31 NOTE — ED Notes (Signed)
Pt returns from radiology, appears more comfortable

## 2015-07-31 NOTE — ED Notes (Signed)
NBP values to PA-C, also pain status

## 2015-07-31 NOTE — ED Notes (Signed)
MD at bedside. 

## 2015-07-31 NOTE — Discharge Instructions (Signed)

## 2017-03-22 ENCOUNTER — Emergency Department (HOSPITAL_BASED_OUTPATIENT_CLINIC_OR_DEPARTMENT_OTHER): Payer: Managed Care, Other (non HMO)

## 2017-03-22 ENCOUNTER — Emergency Department (HOSPITAL_BASED_OUTPATIENT_CLINIC_OR_DEPARTMENT_OTHER)
Admission: EM | Admit: 2017-03-22 | Discharge: 2017-03-23 | Disposition: A | Discharge status: Home / Self Care | Payer: Managed Care, Other (non HMO) | Attending: Emergency Medicine | Admitting: Emergency Medicine

## 2017-03-22 ENCOUNTER — Encounter (HOSPITAL_BASED_OUTPATIENT_CLINIC_OR_DEPARTMENT_OTHER): Payer: Self-pay

## 2017-03-22 DIAGNOSIS — Z79899 Other long term (current) drug therapy: Secondary | ICD-10-CM | POA: Diagnosis not present

## 2017-03-22 DIAGNOSIS — I129 Hypertensive chronic kidney disease with stage 1 through stage 4 chronic kidney disease, or unspecified chronic kidney disease: Secondary | ICD-10-CM | POA: Diagnosis not present

## 2017-03-22 DIAGNOSIS — Z794 Long term (current) use of insulin: Secondary | ICD-10-CM | POA: Insufficient documentation

## 2017-03-22 DIAGNOSIS — E1122 Type 2 diabetes mellitus with diabetic chronic kidney disease: Secondary | ICD-10-CM | POA: Diagnosis not present

## 2017-03-22 DIAGNOSIS — N200 Calculus of kidney: Secondary | ICD-10-CM

## 2017-03-22 DIAGNOSIS — R109 Unspecified abdominal pain: Secondary | ICD-10-CM | POA: Diagnosis present

## 2017-03-22 DIAGNOSIS — N189 Chronic kidney disease, unspecified: Secondary | ICD-10-CM | POA: Insufficient documentation

## 2017-03-22 LAB — COMPREHENSIVE METABOLIC PANEL
ALBUMIN: 3.2 g/dL — AB (ref 3.5–5.0)
ALK PHOS: 60 U/L (ref 38–126)
ALT: 12 U/L — ABNORMAL LOW (ref 17–63)
ANION GAP: 8 (ref 5–15)
AST: 15 U/L (ref 15–41)
BUN: 32 mg/dL — ABNORMAL HIGH (ref 6–20)
CALCIUM: 9.2 mg/dL (ref 8.9–10.3)
CO2: 22 mmol/L (ref 22–32)
Chloride: 106 mmol/L (ref 101–111)
Creatinine, Ser: 2.48 mg/dL — ABNORMAL HIGH (ref 0.61–1.24)
GFR calc non Af Amer: 29 mL/min — ABNORMAL LOW (ref >60)
GFR, EST AFRICAN AMERICAN: 33 mL/min — AB (ref >60)
GLUCOSE: 195 mg/dL — AB (ref 65–99)
POTASSIUM: 4.3 mmol/L (ref 3.5–5.1)
SODIUM: 136 mmol/L (ref 135–145)
TOTAL PROTEIN: 7.2 g/dL (ref 6.5–8.1)
Total Bilirubin: 0.4 mg/dL (ref 0.3–1.2)

## 2017-03-22 LAB — URINALYSIS, ROUTINE W REFLEX MICROSCOPIC
Bilirubin Urine: NEGATIVE
Glucose, UA: 250 mg/dL — AB
Ketones, ur: NEGATIVE mg/dL
LEUKOCYTES UA: NEGATIVE
NITRITE: NEGATIVE
PH: 6.5 (ref 5.0–8.0)
Protein, ur: >300 mg/dL — AB
SPECIFIC GRAVITY, URINE: 1.014 (ref 1.005–1.030)

## 2017-03-22 LAB — CBC WITH DIFFERENTIAL/PLATELET
BASOS ABS: 0 10*3/uL (ref 0.0–0.1)
BASOS PCT: 0 %
Eosinophils Absolute: 0 10*3/uL (ref 0.0–0.7)
Eosinophils Relative: 0 %
HEMATOCRIT: 34.4 % — AB (ref 39.0–52.0)
HEMOGLOBIN: 11.5 g/dL — AB (ref 13.0–17.0)
LYMPHS PCT: 11 %
Lymphs Abs: 1 10*3/uL (ref 0.7–4.0)
MCH: 27 pg (ref 26.0–34.0)
MCHC: 33.4 g/dL (ref 30.0–36.0)
MCV: 80.8 fL (ref 78.0–100.0)
MONO ABS: 0.4 10*3/uL (ref 0.1–1.0)
MONOS PCT: 4 %
NEUTROS ABS: 7.7 10*3/uL (ref 1.7–7.7)
NEUTROS PCT: 85 %
Platelets: 244 10*3/uL (ref 150–400)
RBC: 4.26 MIL/uL (ref 4.22–5.81)
RDW: 14.7 % (ref 11.5–15.5)
WBC: 9.1 10*3/uL (ref 4.0–10.5)

## 2017-03-22 LAB — URINALYSIS, MICROSCOPIC (REFLEX)

## 2017-03-22 MED ORDER — CARVEDILOL 25 MG PO TABS
25.0000 mg | ORAL_TABLET | Freq: Once | ORAL | Status: DC
  Filled 2017-03-22: qty 1

## 2017-03-22 MED ORDER — MORPHINE SULFATE (PF) 4 MG/ML IV SOLN
4.0000 mg | Freq: Once | INTRAVENOUS | Status: AC
  Administered 2017-03-22: 4 mg via INTRAVENOUS
  Filled 2017-03-22: qty 1

## 2017-03-22 MED ORDER — CLONIDINE HCL 0.1 MG PO TABS
0.2000 mg | ORAL_TABLET | Freq: Once | ORAL | Status: AC
  Administered 2017-03-22: 0.2 mg via ORAL
  Filled 2017-03-22: qty 2

## 2017-03-22 MED ORDER — ONDANSETRON HCL 4 MG/2ML IJ SOLN
4.0000 mg | Freq: Once | INTRAMUSCULAR | Status: AC
  Administered 2017-03-22: 4 mg via INTRAVENOUS
  Filled 2017-03-22: qty 2

## 2017-03-22 MED ORDER — SODIUM CHLORIDE 0.9 % IV BOLUS (SEPSIS)
1000.0000 mL | Freq: Once | INTRAVENOUS | Status: AC
  Administered 2017-03-22: 1000 mL via INTRAVENOUS

## 2017-03-22 MED ORDER — HYDRALAZINE HCL 25 MG PO TABS
100.0000 mg | ORAL_TABLET | Freq: Once | ORAL | Status: AC
  Administered 2017-03-22: 100 mg via ORAL
  Filled 2017-03-22: qty 4

## 2017-03-22 NOTE — ED Notes (Signed)
US at bedside

## 2017-03-22 NOTE — ED Notes (Signed)
Pt was given cup to attempt urine sample

## 2017-03-22 NOTE — ED Triage Notes (Signed)
Right flank & lower back pain, constant since 1730. Pt reports nausea and dry heaves. Hx of kidney stones.

## 2017-03-22 NOTE — ED Provider Notes (Signed)
[facility] Provider Note   CSN: [id number] Arrival date & time: 03/22/17  2119  By signing my name below, I, [name] [name], attest that this documentation has been prepared under the direction and in the presence of Laurence Spates, MD. Electronically Signed: Sonum [name], Neurosurgeon. 03/22/17. 12:33 AM.  History   Chief Complaint Chief Complaint  Patient presents with  . Flank Pain    The history is provided by the patient. No language interpreter was used.     HPI Comments: [name] [name] is a 51 y.o. male with past medical history of DM, HTN who presents to the Emergency Department complaining of constant right flank pain that began at 5pm. He describes the pain as dull in nature and has associated nausea and dry heaving. He reports a history of right flank with a prior kidney stone but states that felt sharp and different from what he has today. He denies abdominal pain, diarrhea, constipation, fever, hematuria, dysuria, testicular pain.  Past Medical History:  Diagnosis Date  . Cellulitis and abscess of leg 01/08/2014   RT LEG  . Diabetes mellitus   . Diverticulitis   . Hypertension     Patient Active Problem List   Diagnosis Date Noted  . Morbid obesity (HCC) 10/07/2014  . DOE (dyspnea on exertion) 08/26/2014  . Insulin dependent type 2 diabetes mellitus, uncontrolled (HCC) 01/11/2014  . 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 Cellulitis and abscess 01/08/2014  . Malignant hypertension 01/08/2014  . Diabetes mellitus, type 2 (HCC) 01/08/2014  . Sepsis (HCC) 01/08/2014    Past Surgical History:  Procedure Laterality Date  . COLON SURGERY    . I&D EXTREMITY Right 01/09/2014   Procedure: IRRIGATION AND DEBRIDEMENT EXTREMITY;  Surgeon: Senaida Lange, MD;  Location: [facility] OR;  Service: Orthopedics;  Laterality: Right;  . 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 Authorizing Provider  amLODipine (NORVASC) 10 MG tablet Take 1 tablet (10 mg total) by mouth daily. 01/19/14   Shanker Levora Dredge, MD  carvedilol (COREG) 25 MG tablet Take 1 tablet (25 mg total) by mouth 2 (two) times daily with a meal. 01/19/14   Shanker Levora Dredge, MD  cloNIDine (CATAPRES) 0.2 MG tablet Take 0.2 mg by mouth 2 (two) times daily.    Historical Provider, MD  Dextromethorphan-Guaifenesin (GUAIATUSSIN DM PO) Take by mouth. Municex DM take 1 tablet by mouth every 12 hours    Historical Provider, MD  doxazosin (CARDURA) 8 MG tablet Take 8 mg by mouth daily.    Historical Provider, MD  fexofenadine (ALLEGRA) 180 MG tablet Take 180 mg by mouth daily.    Historical Provider, MD  fluticasone (FLONASE) 50 MCG/ACT nasal spray Place 1 spray into both nostrils daily.    Historical Provider, MD  hydrALAZINE (APRESOLINE) 100 MG tablet Take 1 tablet (100 mg total) by mouth 3 (three) times daily. 01/19/14   Shanker Levora Dredge, MD  hydrochlorothiazide (HYDRODIURIL) 25 MG tablet Take 25 mg by mouth daily.  08/16/14   Historical Provider, MD  insulin aspart (NOVOLOG) 100 UNIT/ML injection Inject into the skin 3 (three) times daily before meals.    Historical Provider, MD  Insulin Detemir (LEVEMIR) 100 UNIT/ML Pen Inject 22 Units into the skin daily at  10 pm. 01/19/14   Maretta Bees, MD  isosorbide mononitrate (IMDUR) 60 MG 24 hr tablet Take 1 tablet (60 mg total) by mouth daily. 10/07/14   Lars Masson, MD  metFORMIN (GLUCOPHAGE-XR) 500 MG 24 hr tablet Take 1,000 mg by mouth daily with breakfast.    Historical Provider, MD  oxyCODONE-acetaminophen (PERCOCET/ROXICET) 5-325 MG per tablet Take 1 tablet by mouth every 6 (six) hours as needed for severe pain. 07/31/15   Cheri Fowler, PA-C    Family History Family History  Problem Relation Age of Onset  .  Hypertension Mother   . Diabetes Mellitus II Father     Social History Social History  Substance Use Topics  . Smoking status: Never Smoker  . Smokeless tobacco: Never Used  . Alcohol use No     Comment: SOCIAL     Allergies   Minocycline and Shellfish allergy   Review of Systems Review of Systems  Gastrointestinal: Positive for nausea and vomiting (dry-heaving). Negative for abdominal pain, constipation and diarrhea.  Genitourinary: Positive for flank pain. Negative for dysuria, hematuria, scrotal swelling and testicular pain.  All other systems reviewed and are negative.    Physical Exam Updated Vital Signs BP (!) 210/109   Pulse 80   Temp 98.1 F (36.7 C) (Oral)   Resp (!) 22   Ht  (1.905 m)   Wt 300 lb (136.1 kg)   SpO2 98%   BMI 37.50 kg/m   Physical Exam  Constitutional: He is oriented to person, place, and time. He appears well-developed and well-nourished.  Uncomfortable   HENT:  Head: Normocephalic and atraumatic.  Mouth/Throat: Oropharynx is clear and moist.  Moist mucous membranes  Eyes: Conjunctivae are normal. Pupils are equal, round, and reactive to light.  Neck: Neck supple.  Cardiovascular: Normal rate, regular rhythm and normal heart sounds.   No murmur heard. Pulmonary/Chest: Effort normal and breath sounds normal.  Abdominal: Soft. Bowel sounds are normal. He exhibits no distension. There is no tenderness.  Musculoskeletal: He exhibits edema (2+ pitting BLE).  Right CVA tenderness   Neurological: He is alert and oriented to person, place, and time.  Fluent speech  Skin: Skin is warm and dry.  Psychiatric: He has a normal mood and affect. Judgment normal.  Nursing note and vitals reviewed.    ED Treatments / Results  DIAGNOSTIC STUDIES: Oxygen Saturation is 100% on RA, normal by my interpretation.    COORDINATION OF CARE: 10:01 PM Discussed treatment plan with pt at bedside and pt agreed to plan.   Labs (all labs ordered are  listed, but only abnormal results are displayed) Labs Reviewed  COMPREHENSIVE METABOLIC PANEL - Abnormal; Notable for the following:       Result Value   Glucose, Bld 195 (*)    BUN 32 (*)    Creatinine, Ser 2.48 (*)    Albumin 3.2 (*)    ALT 12 (*)    GFR calc non Af Amer 29 (*)    GFR calc Af Amer 33 (*)    All other components within normal limits  CBC WITH DIFFERENTIAL/PLATELET - Abnormal; Notable for the following:    Hemoglobin 11.5 (*)    HCT 34.4 (*)    All other components within normal limits  URINALYSIS, ROUTINE W REFLEX MICROSCOPIC - Abnormal; Notable for the following:    Glucose, UA 250 (*)    Hgb urine dipstick MODERATE (*)    Protein, ur >300 (*)    All  other components within normal limits  URINALYSIS, MICROSCOPIC (REFLEX) - Abnormal; Notable for the following:    Bacteria, UA RARE (*)    Squamous Epithelial / LPF 0-5 (*)    All other components within normal limits    EKG  EKG Interpretation None       Radiology US Renal  Result Date: 03/22/2017 CLINICAL DATA:  Initial evaluation for acute right flank pain, vomiting. EXAM: RENAL / URINARY TRACT ULTRASOUND COMPLETE COMPARISON:  Prior CT from 07/30/2017. FINDINGS: Right Kidney: Length: 15.0 cm. Echogenicity within normal limits. Mild to moderate hydronephrosis. Subcentimeter echogenic focus measuring up to 9 mm present within the lower pole the right kidney, suspected to reflect nonobstructive nephrolithiasis. No mass lesion. Left Kidney: Length: 15.2 cm. Echogenicity within normal limits. No definite mass lesion. No hydronephrosis. The Bladder: Appears normal for degree of bladder distention. A left ureteral jet was present. No right sided jet identified. IMPRESSION: 1. Mild to moderate right hydronephrosis. No definite right-sided ureteral jet identified. Finding raises the possibility for a distal obstructive stone. Finding could be further assessed with cross-sectional imaging as desired. 2. Probable  subcentimeter nonobstructing right renal nephrolithiasis. 3. Normal sonographic appearance of the left kidney. Electronically Signed   By: Rise Mu M.D.   On: 03/22/2017 23:09    Procedures Procedures (including critical care time)  Medications Ordered in ED Medications  morphine 4 MG/ML injection 4 mg (not administered)  oxyCODONE-acetaminophen (PERCOCET/ROXICET) 5-325 MG per tablet 2 tablet (not administered)  tamsulosin (FLOMAX) capsule 0.4 mg (not administered)  ondansetron (ZOFRAN) injection 4 mg (4 mg Intravenous Given 03/22/17 2234)  morphine 4 MG/ML injection 4 mg (4 mg Intravenous Given 03/22/17 2234)  sodium chloride 0.9 % bolus 1,000 mL (1,000 mLs Intravenous New Bag/Given 03/22/17 2309)  morphine 4 MG/ML injection 4 mg (4 mg Intravenous Given 03/22/17 2318)  hydrALAZINE (APRESOLINE) tablet 100 mg (100 mg Oral Given 03/22/17 2327)  cloNIDine (CATAPRES) tablet 0.2 mg (0.2 mg Oral Given 03/22/17 2328)     Initial Impression / Assessment and Plan / ED Course  I have reviewed the triage vital signs and the nursing notes.  Pertinent labs & imaging results that were available during my care of the patient were reviewed by me and considered in my medical decision making (see chart for details).     PT w/ sudden onset R flank pain and dry heaving. He was uncomfortable on exam, vital signs notable for hypertension. He has not had his evening HTN meds. He had right CVA tenderness, no abdominal tenderness or scrotal pain. Gave morphine and obtained above labs which were notable for creatinine of 2.48, UA containing blood but no evidence of infection. Lab work from 2016 shows some chronic kidney disease with creatinine around 2; no recent values to compare to today. B/c pt has had previous kidney stones, I feel like today's sx are likely due to the same and therefore avoided radiation exposure and instead obtained US kidneys. Ultrasound showed mild to moderate right hydronephrosis  with possible distal obstructive stone.   Gave the patient his evening blood pressure medications hydralazine and clonidine, we do not have Coreg here. I discussed ultrasound and lab work findings. On repeat exam, he does continue to have pain but is willing to try repeat dose of pain medication in an attempt to manage symptoms at home. I have given repeat dose of morphine as well as Percocet and Flomax. I'm signing the patient out to oncoming provider who will follow-up on his pain.  If his pain is unable to be managed in the ED, he may require transfer for admission for symptom management.  Final Clinical Impressions(s) / ED Diagnoses   Final diagnoses:  None    New Prescriptions New Prescriptions   No medications on file  I personally performed the services described in this documentation, which was scribed in my presence. The recorded information has been reviewed and is accurate.    Laurence Spates, MD 03/23/17 0040

## 2017-03-23 MED ORDER — MORPHINE SULFATE (PF) 4 MG/ML IV SOLN
4.0000 mg | Freq: Once | INTRAVENOUS | Status: AC
  Administered 2017-03-23: 4 mg via INTRAVENOUS
  Filled 2017-03-23: qty 1

## 2017-03-23 MED ORDER — OXYCODONE-ACETAMINOPHEN 5-325 MG PO TABS: 1.0000 | ORAL_TABLET | ORAL | 0 refills | Status: DC | PRN

## 2017-03-23 MED ORDER — TAMSULOSIN HCL 0.4 MG PO CAPS
0.4000 mg | ORAL_CAPSULE | Freq: Once | ORAL | Status: AC
  Administered 2017-03-23: 0.4 mg via ORAL
  Filled 2017-03-23: qty 1

## 2017-03-23 MED ORDER — TAMSULOSIN HCL 0.4 MG PO CAPS
ORAL_CAPSULE | ORAL | 0 refills | Status: DC
Start: 2017-03-23 — End: 2018-03-06

## 2017-03-23 MED ORDER — OXYCODONE-ACETAMINOPHEN 5-325 MG PO TABS
2.0000 | ORAL_TABLET | Freq: Once | ORAL | Status: AC
  Administered 2017-03-23: 2 via ORAL
  Filled 2017-03-23: qty 2

## 2017-03-23 MED ORDER — ONDANSETRON 8 MG PO TBDP: 8.0000 mg | ORAL_TABLET | Freq: Three times a day (TID) | ORAL | 1 refills | Status: DC | PRN

## 2017-03-23 NOTE — Discharge Instructions (Signed)
Avoid taking ibuprofen/aleve/advil/motrin and take percocet as needed for pain control. If pain is mild, take tylenol instead (not together with percocet). Follow up with urologist at next available appointment.

## 2017-10-22 ENCOUNTER — Other Ambulatory Visit: Payer: Self-pay | Admitting: Family Medicine

## 2017-10-22 ENCOUNTER — Ambulatory Visit
Admission: RE | Admit: 2017-10-22 | Discharge: 2017-10-22 | Disposition: A | Discharge status: Home / Self Care | Payer: Managed Care, Other (non HMO) | Source: Ambulatory Visit | Attending: Family Medicine | Admitting: Family Medicine

## 2017-10-22 DIAGNOSIS — R0602 Shortness of breath: Secondary | ICD-10-CM

## 2017-11-30 ENCOUNTER — Other Ambulatory Visit: Payer: Self-pay | Admitting: Nephrology

## 2017-11-30 DIAGNOSIS — N184 Chronic kidney disease, stage 4 (severe): Secondary | ICD-10-CM

## 2017-12-10 ENCOUNTER — Other Ambulatory Visit: Payer: Self-pay

## 2017-12-10 DIAGNOSIS — N185 Chronic kidney disease, stage 5: Secondary | ICD-10-CM

## 2017-12-13 ENCOUNTER — Ambulatory Visit (INDEPENDENT_AMBULATORY_CARE_PROVIDER_SITE_OTHER)
Admission: RE | Admit: 2017-12-13 | Discharge: 2017-12-13 | Disposition: A | Discharge status: Home / Self Care | Payer: 59 | Source: Ambulatory Visit | Attending: Vascular Surgery | Admitting: Vascular Surgery

## 2017-12-13 ENCOUNTER — Ambulatory Visit (HOSPITAL_COMMUNITY)
Admission: RE | Admit: 2017-12-13 | Discharge: 2017-12-13 | Disposition: A | Discharge status: Home / Self Care | Payer: 59 | Source: Ambulatory Visit | Attending: Vascular Surgery | Admitting: Vascular Surgery

## 2017-12-13 ENCOUNTER — Ambulatory Visit (INDEPENDENT_AMBULATORY_CARE_PROVIDER_SITE_OTHER): Payer: 59 | Admitting: Vascular Surgery

## 2017-12-13 ENCOUNTER — Encounter: Payer: Self-pay | Admitting: Vascular Surgery

## 2017-12-13 DIAGNOSIS — N184 Chronic kidney disease, stage 4 (severe): Secondary | ICD-10-CM | POA: Diagnosis not present

## 2017-12-13 DIAGNOSIS — N185 Chronic kidney disease, stage 5: Secondary | ICD-10-CM | POA: Insufficient documentation

## 2017-12-13 NOTE — Progress Notes (Signed)
Requested by:  Johny BlamerHarris, William, MD [phone] [address] [place], [place] [address]  Reason for consultation: New access   History of Present Illness   [name] [name] is a 52 y.o. (October 07, 1966) male who presents for evaluation for permanent access.  The patient is right hand dominant.  The patient has not had previous access procedures.  Previous central venous cannulation procedures include: none.  The patient has never had a PPM placed.   Past Medical History:  Diagnosis Date  . Cellulitis and abscess of leg 01/08/2014   RT LEG  . Diabetes mellitus   . Diverticulitis   . Hypertension     Past Surgical History:  Procedure Laterality Date  . COLON SURGERY    . I&D EXTREMITY Right 01/09/2014   Procedure: IRRIGATION AND DEBRIDEMENT EXTREMITY;  Surgeon: Senaida LangeKevin M Supple, MD;  Location: [facility] OR;  Service: Orthopedics;  Laterality: Right;  . 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 Marital status: Married    Spouse name: Not on file  . Number of children: Not on file  . Years of education: Not on file  . Highest education level: Not on file  Social Needs  . Financial resource strain: Not on file  . Food insecurity - worry: Not on file  . Food insecurity - inability: Not on file  . Transportation needs - medical: Not on file  . Transportation needs - non-medical: Not on file  Occupational History  . Not on file  Tobacco Use  . Smoking status: Never Smoker  . Smokeless tobacco: Never Used  Substance and Sexual Activity  . Alcohol use: No    Comment: SOCIAL  . Drug use: No  . Sexual activity: Yes  Other Topics Concern  . Not on file  Social History Narrative  . Not on file    Family History  Problem Relation Age of Onset  . Hypertension Mother   . Diabetes Mellitus II Father      Current Outpatient Medications  Medication Sig Dispense Refill  . amLODipine (NORVASC) 10 MG tablet Take 1 tablet (10 mg total) by mouth daily. 30 tablet 0  . cloNIDine (CATAPRES) 0.2 MG tablet Take 0.2 mg by mouth 2 (two) times daily.    Marland Kitchen. doxazosin (CARDURA) 8 MG tablet Take 8 mg by mouth daily.    . fexofenadine (ALLEGRA) 180 MG tablet Take 180 mg by mouth daily.    . furosemide (LASIX) 20 MG tablet Take 20 mg by mouth.    . Insulin Detemir (LEVEMIR) 100 UNIT/ML Pen Inject 22 Units into the skin daily at 10 pm. 15 mL 2  . labetalol (NORMODYNE) 200 MG tablet Take 200 mg by mouth 2 (two) times daily.    . tamsulosin (FLOMAX) 0.4 MG CAPS capsule Take one capsule daily until stone passes. 15 capsule 0   No current facility-administered medications for this visit.     Allergies  Allergen Reactions  . Minocycline Anaphylaxis    SOB, itching, hives.  . Shellfish Allergy Nausea And Vomiting    REVIEW OF SYSTEMS (negative unless checked):   Cardiac:  []  Chest pain or chest pressure? []  Shortness of breath upon activity? []  Shortness of breath when lying flat? []  Irregular heart rhythm?  Vascular:  []  Pain in calf, thigh, or hip brought on by  walking? []  Pain in feet at night that wakes you up from your sleep? []  Blood clot in your veins? [x]  Leg swelling?  Pulmonary:  []  Oxygen at home? []  Productive cough? []  Wheezing?  Neurologic:  []  Sudden weakness in arms or legs? []  Sudden numbness in arms or legs? []  Sudden onset of difficult speaking or slurred speech? []  Temporary loss of vision in one eye? []  Problems with dizziness?  Gastrointestinal:  []  Blood in stool? []  Vomited blood?  Genitourinary:  []  Burning when urinating? []  Blood in urine?  Psychiatric:  []  Major depression  Hematologic:  []  Bleeding problems? []  Problems with blood clotting?  Dermatologic:  []  Rashes or ulcers?  Constitutional:  []  Fever or chills?  Ear/Nose/Throat:  []   Change in hearing? []  Nose bleeds? []  Sore throat?  Musculoskeletal:  []  Back pain? []  Joint pain? []  Muscle pain?   Physical Examination     Vitals:   12/13/17 1512  BP: (!) 240/114  Pulse: 77  Resp: (!) 22  Temp: 99.7 F (37.6 C)  TempSrc: Oral  SpO2: 99%  Weight: (!) 380 lb (172.4 kg)  Height: 6\' 3"  (1.905 m)   Body mass index is 47.5 kg/m.  General Alert, O x 3, Obese, NAD  Head [place]/AT,    Ear/Nose/ Throat Hearing grossly intact, nares without erythema or drainage, oropharynx without Erythema or Exudate, Mallampati score: 3,   Eyes PERRLA, EOMI,    Neck Supple, mid-line trachea,    Pulmonary Sym exp, good B air movt, CTA B  Cardiac RRR, Nl S1, S2, no Murmurs, No rubs, No S3,S4  Vascular Vessel Right Left  Radial Palpable Palpable  Brachial Palpable Palpable  Carotid Palpable, No Bruit Palpable, No Bruit  Aorta Not palpable N/A  Femoral Palpable Palpable  Popliteal Not palpable Not palpable  PT Not palpable Not palpable  DP Not palpable Not palpable    Gastro- intestinal soft, non-distended, non-tender to palpation, No guarding or rebound, no HSM, no masses, no CVAT B, No palpable prominent aortic pulse, Surgical incisions well healed  Musculo- skeletal M/S 5/5 throughout  , Extremities without ischemic changes except R TMA, Non-pitting edema present: 2+ B, No visible varicosities , No Lipodermatosclerosis present  Neurologic Cranial nerves 2-12 intact , Pain and light touch intact in extremities , Motor exam as listed above  Psychiatric Judgement intact, Mood & affect appropriate for pt's clinical situation  Dermatologic See M/S exam for extremity exam, No rashes otherwise noted  Lymphatic  Palpable lymph nodes: None     Non-invasive Vascular Imaging   BUE Vein Mapping  (Date: 12/13/2017):   R arm: acceptable vein conduits include questionable basilic  L arm: acceptable vein conduits include questionable basilic  BUE Doppler (Date: 12/13/2017):   R  arm:   Brachial: tri, 4.6 mm  Radial: tri, 3.3 mm  Ulnar: tri, 2.7 mm  L arm:   Brachial: tri, 5.3 mm  Radial: tri, 3.4 mm  Ulnar: tri, 2.5 mm   Outside Studies/Documentation   5 pages of outside documents were reviewed including: outpatient nephrology chart.   Medical Decision Making   [name] [name] is a 52 y.o. male who presents with chronic kidney disease stage 3-4   Based on vein mapping and examination, this patient's permanent access options include: L staged BVT, possible R staged BVT.  I would first proceed with left staged basilic vein transposition .  I had an extensive discussion with this patient in regards to the nature of access  surgery, including risk, benefits, and alternatives.    The patient is aware that the risks of access surgery include but are not limited to: bleeding, infection, steal syndrome, nerve damage, ischemic monomelic neuropathy, failure of access to mature, complications related to venous hypertension, and possible need for additional access procedures in the future. I discussed with the patient the nature of the staged access procedure, specifically the need for a second operation to transpose the first stage fistula if it matures adequately.    The patient has NOT agreed to proceed with the above procedure.  He is going to his education classes and talk to his nephrologist prior to proceeding.   Leonides Sake, MD, FACS [facility] Office: [phone] Pager: [phone]  12/13/2017, 4:39 PM

## 2017-12-16 ENCOUNTER — Ambulatory Visit
Admission: RE | Admit: 2017-12-16 | Discharge: 2017-12-16 | Disposition: A | Discharge status: Home / Self Care | Payer: 59 | Source: Ambulatory Visit | Attending: Nephrology | Admitting: Nephrology

## 2017-12-16 DIAGNOSIS — N184 Chronic kidney disease, stage 4 (severe): Secondary | ICD-10-CM

## 2017-12-17 ENCOUNTER — Encounter: Payer: Self-pay | Admitting: Nephrology

## 2017-12-20 ENCOUNTER — Encounter (HOSPITAL_COMMUNITY): Payer: 59

## 2017-12-26 ENCOUNTER — Other Ambulatory Visit (HOSPITAL_COMMUNITY): Payer: Self-pay | Admitting: *Deleted

## 2017-12-27 ENCOUNTER — Ambulatory Visit (HOSPITAL_COMMUNITY)
Admission: RE | Admit: 2017-12-27 | Discharge: 2017-12-27 | Disposition: A | Discharge status: Home / Self Care | Payer: 59 | Source: Ambulatory Visit | Attending: Nephrology | Admitting: Nephrology

## 2017-12-27 VITALS — BP 202/108 | HR 70 | Temp 97.9°F | Resp 18

## 2017-12-27 DIAGNOSIS — N184 Chronic kidney disease, stage 4 (severe): Secondary | ICD-10-CM | POA: Diagnosis present

## 2017-12-27 LAB — POCT HEMOGLOBIN-HEMACUE: HEMOGLOBIN: 10.1 g/dL — AB (ref 13.0–17.0)

## 2017-12-27 MED ORDER — CLONIDINE HCL 0.1 MG PO TABS
0.3000 mg | ORAL_TABLET | Freq: Once | ORAL | Status: DC
  Administered 2017-12-27: 0.3 mg via ORAL

## 2017-12-27 MED ORDER — EPOETIN ALFA 10000 UNIT/ML IJ SOLN
INTRAMUSCULAR | Status: AC
  Filled 2017-12-27: qty 1

## 2017-12-27 MED ORDER — EPOETIN ALFA 10000 UNIT/ML IJ SOLN: 5000.0000 [IU] | INTRAMUSCULAR | Status: DC

## 2017-12-27 MED ORDER — CLONIDINE HCL 0.1 MG PO TABS
ORAL_TABLET | ORAL | Status: AC
  Administered 2017-12-27: 0.3 mg via ORAL
  Filled 2017-12-27: qty 3

## 2017-12-27 NOTE — Progress Notes (Signed)
Pt's Bp today 205 97 even though he took norvasc 10mg , clonidine 0.2mg , lasix 20mg , labetalol 200mg  around 10:30/11:00 this morning.  Dr Hyman Hopeswebb stated to give him 0.3mg  now, recheck his Bp and if he is below 190 to give procrit injection.

## 2017-12-27 NOTE — Progress Notes (Signed)
BP 202/108 with HR 70 55 minutes after 0.3mg  clonidine given.  Reported this to Dr Hyman HopesWebb and he stated to hold injection today and tell the patient to increase his labetalol from 200mg  three times a day, to 200mg  four times a day. Dr Hyman HopesWebb also stated he may have him come into the office for them to check his BP one day next week. I relayed this to the pt and told him to call Dr Marland McalpineWebb's office Monday to see when he wants him to come in for the BP check.  Pt verbalized understanding.

## 2017-12-27 NOTE — Discharge Instructions (Signed)
Epoetin Alfa injection °What is this medicine? °EPOETIN ALFA (e POE e tin AL fa) helps your body make more red blood cells. This medicine is used to treat anemia caused by chronic kidney failure, cancer chemotherapy, or HIV-therapy. It may also be used before surgery if you have anemia. °This medicine may be used for other purposes; ask your health care provider or pharmacist if you have questions. °COMMON BRAND NAME(S): Epogen, Procrit °What should I tell my health care provider before I take this medicine? °They need to know if you have any of these conditions: °-blood clotting disorders °-cancer patient not on chemotherapy °-cystic fibrosis °-heart disease, such as angina or heart failure °-hemoglobin level of 12 g/dL or greater °-high blood pressure °-low levels of folate, iron, or vitamin B12 °-seizures °-an unusual or allergic reaction to erythropoietin, albumin, benzyl alcohol, hamster proteins, other medicines, foods, dyes, or preservatives °-pregnant or trying to get pregnant °-breast-feeding °How should I use this medicine? °This medicine is for injection into a vein or under the skin. It is usually given by a health care professional in a hospital or clinic setting. °If you get this medicine at home, you will be taught how to prepare and give this medicine. Use exactly as directed. Take your medicine at regular intervals. Do not take your medicine more often than directed. °It is important that you put your used needles and syringes in a special sharps container. Do not put them in a trash can. If you do not have a sharps container, call your pharmacist or healthcare provider to get one. °A special MedGuide will be given to you by the pharmacist with each prescription and refill. Be sure to read this information carefully each time. °Talk to your pediatrician regarding the use of this medicine in children. While this drug may be prescribed for selected conditions, precautions do apply. °Overdosage: If you  think you have taken too much of this medicine contact a poison control center or emergency room at once. °NOTE: This medicine is only for you. Do not share this medicine with others. °What if I miss a dose? °If you miss a dose, take it as soon as you can. If it is almost time for your next dose, take only that dose. Do not take double or extra doses. °What may interact with this medicine? °Do not take this medicine with any of the following medications: °-darbepoetin alfa °This list may not describe all possible interactions. Give your health care provider a list of all the medicines, herbs, non-prescription drugs, or dietary supplements you use. Also tell them if you smoke, drink alcohol, or use illegal drugs. Some items may interact with your medicine. °What should I watch for while using this medicine? °Your condition will be monitored carefully while you are receiving this medicine. °You may need blood work done while you are taking this medicine. °What side effects may I notice from receiving this medicine? °Side effects that you should report to your doctor or health care professional as soon as possible: °-allergic reactions like skin rash, itching or hives, swelling of the face, lips, or tongue °-breathing problems °-changes in vision °-chest pain °-confusion, trouble speaking or understanding °-feeling faint or lightheaded, falls °-high blood pressure °-muscle aches or pains °-pain, swelling, warmth in the leg °-rapid weight gain °-severe headaches °-sudden numbness or weakness of the face, arm or leg °-trouble walking, dizziness, loss of balance or coordination °-seizures (convulsions) °-swelling of the ankles, feet, hands °-unusually weak or tired °  Side effects that usually do not require medical attention (report to your doctor or health care professional if they continue or are bothersome): °-diarrhea °-fever, chills (flu-like symptoms) °-headaches °-nausea, vomiting °-redness, stinging, or swelling at  site where injected °This list may not describe all possible side effects. Call your doctor for medical advice about side effects. You may report side effects to FDA at 1-800-FDA-1088. °Where should I keep my medicine? °Keep out of the reach of children. °Store in a refrigerator between 2 and 8 degrees C (36 and 46 degrees F). Do not freeze or shake. Throw away any unused portion if using a single-dose vial. Multi-dose vials can be kept in the refrigerator for up to 21 days after the initial dose. Throw away unused medicine. °NOTE: This sheet is a summary. It may not cover all possible information. If you have questions about this medicine, talk to your doctor, pharmacist, or health care provider. °© 2018 [employer] (2016-07-09 19:42:31) ° °

## 2018-01-03 ENCOUNTER — Encounter (HOSPITAL_COMMUNITY): Payer: 59

## 2018-02-16 ENCOUNTER — Inpatient Hospital Stay (HOSPITAL_COMMUNITY): Payer: 59

## 2018-02-16 ENCOUNTER — Inpatient Hospital Stay (HOSPITAL_COMMUNITY)
Admission: EM | Admit: 2018-02-16 | Discharge: 2018-03-06 | DRG: 981 | Disposition: A | Discharge status: Other Acute Inpatient Hospital | Payer: 59 | Attending: Neurology | Admitting: Neurology

## 2018-02-16 ENCOUNTER — Emergency Department (HOSPITAL_COMMUNITY): Payer: 59

## 2018-02-16 ENCOUNTER — Encounter (HOSPITAL_COMMUNITY): Payer: Self-pay

## 2018-02-16 DIAGNOSIS — I610 Nontraumatic intracerebral hemorrhage in hemisphere, subcortical: Secondary | ICD-10-CM | POA: Diagnosis present

## 2018-02-16 DIAGNOSIS — G936 Cerebral edema: Secondary | ICD-10-CM | POA: Diagnosis present

## 2018-02-16 DIAGNOSIS — Z992 Dependence on renal dialysis: Secondary | ICD-10-CM

## 2018-02-16 DIAGNOSIS — N179 Acute kidney failure, unspecified: Secondary | ICD-10-CM

## 2018-02-16 DIAGNOSIS — N185 Chronic kidney disease, stage 5: Secondary | ICD-10-CM | POA: Diagnosis not present

## 2018-02-16 DIAGNOSIS — N184 Chronic kidney disease, stage 4 (severe): Secondary | ICD-10-CM

## 2018-02-16 DIAGNOSIS — Z91013 Allergy to seafood: Secondary | ICD-10-CM

## 2018-02-16 DIAGNOSIS — S0003XA Contusion of scalp, initial encounter: Secondary | ICD-10-CM | POA: Diagnosis present

## 2018-02-16 DIAGNOSIS — H53462 Homonymous bilateral field defects, left side: Secondary | ICD-10-CM | POA: Diagnosis not present

## 2018-02-16 DIAGNOSIS — Z6841 Body Mass Index (BMI) 40.0 and over, adult: Secondary | ICD-10-CM | POA: Diagnosis not present

## 2018-02-16 DIAGNOSIS — Z4659 Encounter for fitting and adjustment of other gastrointestinal appliance and device: Secondary | ICD-10-CM

## 2018-02-16 DIAGNOSIS — I619 Nontraumatic intracerebral hemorrhage, unspecified: Secondary | ICD-10-CM

## 2018-02-16 DIAGNOSIS — E785 Hyperlipidemia, unspecified: Secondary | ICD-10-CM | POA: Diagnosis present

## 2018-02-16 DIAGNOSIS — Z452 Encounter for adjustment and management of vascular access device: Secondary | ICD-10-CM

## 2018-02-16 DIAGNOSIS — Z794 Long term (current) use of insulin: Secondary | ICD-10-CM

## 2018-02-16 DIAGNOSIS — I5032 Chronic diastolic (congestive) heart failure: Secondary | ICD-10-CM | POA: Diagnosis present

## 2018-02-16 DIAGNOSIS — N186 End stage renal disease: Secondary | ICD-10-CM | POA: Diagnosis present

## 2018-02-16 DIAGNOSIS — E1151 Type 2 diabetes mellitus with diabetic peripheral angiopathy without gangrene: Secondary | ICD-10-CM | POA: Diagnosis not present

## 2018-02-16 DIAGNOSIS — M79609 Pain in unspecified limb: Secondary | ICD-10-CM | POA: Diagnosis not present

## 2018-02-16 DIAGNOSIS — R29810 Facial weakness: Secondary | ICD-10-CM | POA: Diagnosis not present

## 2018-02-16 DIAGNOSIS — E872 Acidosis: Secondary | ICD-10-CM | POA: Diagnosis present

## 2018-02-16 DIAGNOSIS — E875 Hyperkalemia: Secondary | ICD-10-CM | POA: Diagnosis not present

## 2018-02-16 DIAGNOSIS — E119 Type 2 diabetes mellitus without complications: Secondary | ICD-10-CM

## 2018-02-16 DIAGNOSIS — D509 Iron deficiency anemia, unspecified: Secondary | ICD-10-CM | POA: Diagnosis present

## 2018-02-16 DIAGNOSIS — R4781 Slurred speech: Secondary | ICD-10-CM | POA: Diagnosis not present

## 2018-02-16 DIAGNOSIS — E1159 Type 2 diabetes mellitus with other circulatory complications: Secondary | ICD-10-CM | POA: Diagnosis not present

## 2018-02-16 DIAGNOSIS — I161 Hypertensive emergency: Secondary | ICD-10-CM | POA: Diagnosis present

## 2018-02-16 DIAGNOSIS — E870 Hyperosmolality and hypernatremia: Secondary | ICD-10-CM | POA: Diagnosis not present

## 2018-02-16 DIAGNOSIS — I361 Nonrheumatic tricuspid (valve) insufficiency: Secondary | ICD-10-CM | POA: Diagnosis not present

## 2018-02-16 DIAGNOSIS — J9601 Acute respiratory failure with hypoxia: Secondary | ICD-10-CM

## 2018-02-16 DIAGNOSIS — E1122 Type 2 diabetes mellitus with diabetic chronic kidney disease: Secondary | ICD-10-CM | POA: Diagnosis not present

## 2018-02-16 DIAGNOSIS — I132 Hypertensive heart and chronic kidney disease with heart failure and with stage 5 chronic kidney disease, or end stage renal disease: Secondary | ICD-10-CM | POA: Diagnosis not present

## 2018-02-16 DIAGNOSIS — G8194 Hemiplegia, unspecified affecting left nondominant side: Secondary | ICD-10-CM | POA: Diagnosis present

## 2018-02-16 DIAGNOSIS — Z9889 Other specified postprocedural states: Secondary | ICD-10-CM

## 2018-02-16 DIAGNOSIS — I809 Phlebitis and thrombophlebitis of unspecified site: Secondary | ICD-10-CM

## 2018-02-16 DIAGNOSIS — Z833 Family history of diabetes mellitus: Secondary | ICD-10-CM | POA: Diagnosis not present

## 2018-02-16 DIAGNOSIS — R402413 Glasgow coma scale score 13-15, at hospital admission: Secondary | ICD-10-CM | POA: Diagnosis present

## 2018-02-16 DIAGNOSIS — Z8249 Family history of ischemic heart disease and other diseases of the circulatory system: Secondary | ICD-10-CM | POA: Diagnosis not present

## 2018-02-16 DIAGNOSIS — N189 Chronic kidney disease, unspecified: Secondary | ICD-10-CM

## 2018-02-16 DIAGNOSIS — R0602 Shortness of breath: Secondary | ICD-10-CM

## 2018-02-16 DIAGNOSIS — Z23 Encounter for immunization: Secondary | ICD-10-CM

## 2018-02-16 DIAGNOSIS — R531 Weakness: Secondary | ICD-10-CM | POA: Diagnosis present

## 2018-02-16 DIAGNOSIS — Z419 Encounter for procedure for purposes other than remedying health state, unspecified: Secondary | ICD-10-CM

## 2018-02-16 DIAGNOSIS — I808 Phlebitis and thrombophlebitis of other sites: Secondary | ICD-10-CM | POA: Diagnosis present

## 2018-02-16 DIAGNOSIS — R29717 NIHSS score 17: Secondary | ICD-10-CM | POA: Diagnosis not present

## 2018-02-16 DIAGNOSIS — I509 Heart failure, unspecified: Secondary | ICD-10-CM

## 2018-02-16 DIAGNOSIS — M7989 Other specified soft tissue disorders: Secondary | ICD-10-CM | POA: Diagnosis not present

## 2018-02-16 LAB — COMPREHENSIVE METABOLIC PANEL
ALK PHOS: 93 U/L (ref 38–126)
ALT: 20 U/L (ref 17–63)
AST: 17 U/L (ref 15–41)
Albumin: 3 g/dL — ABNORMAL LOW (ref 3.5–5.0)
Anion gap: 9 (ref 5–15)
BILIRUBIN TOTAL: 1 mg/dL (ref 0.3–1.2)
BUN: 38 mg/dL — ABNORMAL HIGH (ref 6–20)
CALCIUM: 9.1 mg/dL (ref 8.9–10.3)
CO2: 21 mmol/L — AB (ref 22–32)
CREATININE: 3.78 mg/dL — AB (ref 0.61–1.24)
Chloride: 111 mmol/L (ref 101–111)
GFR calc Af Amer: 20 mL/min — ABNORMAL LOW (ref >60)
GFR calc non Af Amer: 17 mL/min — ABNORMAL LOW (ref >60)
Glucose, Bld: 131 mg/dL — ABNORMAL HIGH (ref 65–99)
Potassium: 4.9 mmol/L (ref 3.5–5.1)
SODIUM: 141 mmol/L (ref 135–145)
Total Protein: 7.6 g/dL (ref 6.5–8.1)

## 2018-02-16 LAB — I-STAT TROPONIN, ED: Troponin i, poc: 0.04 ng/mL (ref 0.00–0.08)

## 2018-02-16 LAB — URINALYSIS, ROUTINE W REFLEX MICROSCOPIC
BILIRUBIN URINE: NEGATIVE
Glucose, UA: 50 mg/dL — AB
KETONES UR: 5 mg/dL — AB
LEUKOCYTES UA: NEGATIVE
Nitrite: NEGATIVE
Protein, ur: >=300 mg/dL — AB
SQUAMOUS EPITHELIAL / LPF: NONE SEEN
Specific Gravity, Urine: 1.016 (ref 1.005–1.030)
pH: 6 (ref 5.0–8.0)

## 2018-02-16 LAB — BASIC METABOLIC PANEL
Anion gap: 8 (ref 5–15)
BUN: 39 mg/dL — AB (ref 6–20)
CO2: 17 mmol/L — AB (ref 22–32)
Calcium: 8.4 mg/dL — ABNORMAL LOW (ref 8.9–10.3)
Chloride: 113 mmol/L — ABNORMAL HIGH (ref 101–111)
Creatinine, Ser: 3.64 mg/dL — ABNORMAL HIGH (ref 0.61–1.24)
GFR calc Af Amer: 21 mL/min — ABNORMAL LOW (ref >60)
GFR calc non Af Amer: 18 mL/min — ABNORMAL LOW (ref >60)
GLUCOSE: 161 mg/dL — AB (ref 65–99)
POTASSIUM: 4.6 mmol/L (ref 3.5–5.1)
Sodium: 138 mmol/L (ref 135–145)

## 2018-02-16 LAB — CBC WITH DIFFERENTIAL/PLATELET
Basophils Absolute: 0 10*3/uL (ref 0.0–0.1)
Basophils Relative: 0 %
EOS ABS: 0 10*3/uL (ref 0.0–0.7)
Eosinophils Relative: 0 %
HEMATOCRIT: 33.2 % — AB (ref 39.0–52.0)
HEMOGLOBIN: 10.5 g/dL — AB (ref 13.0–17.0)
LYMPHS ABS: 0.8 10*3/uL (ref 0.7–4.0)
LYMPHS PCT: 10 %
MCH: 26.6 pg (ref 26.0–34.0)
MCHC: 31.6 g/dL (ref 30.0–36.0)
MCV: 84.1 fL (ref 78.0–100.0)
Monocytes Absolute: 0.9 10*3/uL (ref 0.1–1.0)
Monocytes Relative: 10 %
NEUTROS ABS: 6.9 10*3/uL (ref 1.7–7.7)
NEUTROS PCT: 80 %
Platelets: 331 10*3/uL (ref 150–400)
RBC: 3.95 MIL/uL — AB (ref 4.22–5.81)
RDW: 15.9 % — ABNORMAL HIGH (ref 11.5–15.5)
WBC: 8.6 10*3/uL (ref 4.0–10.5)

## 2018-02-16 LAB — I-STAT CHEM 8, ED
BUN: 36 mg/dL — ABNORMAL HIGH (ref 6–20)
Calcium, Ion: 1.21 mmol/L (ref 1.15–1.40)
Chloride: 111 mmol/L (ref 101–111)
Creatinine, Ser: 3.9 mg/dL — ABNORMAL HIGH (ref 0.61–1.24)
Glucose, Bld: 129 mg/dL — ABNORMAL HIGH (ref 65–99)
HEMATOCRIT: 34 % — AB (ref 39.0–52.0)
HEMOGLOBIN: 11.6 g/dL — AB (ref 13.0–17.0)
Potassium: 5 mmol/L (ref 3.5–5.1)
SODIUM: 143 mmol/L (ref 135–145)
TCO2: 20 mmol/L — AB (ref 22–32)

## 2018-02-16 LAB — MRSA PCR SCREENING: MRSA BY PCR: NEGATIVE

## 2018-02-16 LAB — ETHANOL: Alcohol, Ethyl (B): <10 mg/dL (ref <10)

## 2018-02-16 LAB — RAPID URINE DRUG SCREEN, HOSP PERFORMED
Amphetamines: NOT DETECTED
Barbiturates: NOT DETECTED
Benzodiazepines: NOT DETECTED
COCAINE: NOT DETECTED
Opiates: NOT DETECTED
Tetrahydrocannabinol: NOT DETECTED

## 2018-02-16 LAB — TRIGLYCERIDES: Triglycerides: 70 mg/dL (ref <150)

## 2018-02-16 LAB — CK: Total CK: 287 U/L (ref 49–397)

## 2018-02-16 LAB — PROCALCITONIN: PROCALCITONIN: 0.15 ng/mL

## 2018-02-16 LAB — GLUCOSE, CAPILLARY: Glucose-Capillary: 139 mg/dL — ABNORMAL HIGH (ref 65–99)

## 2018-02-16 MED ORDER — INSULIN ASPART 100 UNIT/ML ~~LOC~~ SOLN
0.0000 [IU] | SUBCUTANEOUS | Status: DC
  Administered 2018-02-16: 2 [IU] via SUBCUTANEOUS
  Administered 2018-02-17 (×2): 3 [IU] via SUBCUTANEOUS
  Administered 2018-02-17 (×2): 2 [IU] via SUBCUTANEOUS
  Administered 2018-02-17: 3 [IU] via SUBCUTANEOUS
  Administered 2018-02-18 (×5): 2 [IU] via SUBCUTANEOUS
  Administered 2018-02-19: 3 [IU] via SUBCUTANEOUS
  Administered 2018-02-19: 2 [IU] via SUBCUTANEOUS
  Administered 2018-02-19 – 2018-02-20 (×3): 3 [IU] via SUBCUTANEOUS
  Administered 2018-02-20 – 2018-02-21 (×7): 2 [IU] via SUBCUTANEOUS
  Administered 2018-02-21: 3 [IU] via SUBCUTANEOUS
  Administered 2018-02-21 – 2018-02-22 (×6): 2 [IU] via SUBCUTANEOUS
  Administered 2018-02-22: 3 [IU] via SUBCUTANEOUS
  Administered 2018-02-23 – 2018-02-24 (×6): 2 [IU] via SUBCUTANEOUS
  Administered 2018-02-24: 3 [IU] via SUBCUTANEOUS
  Administered 2018-02-25 (×2): 2 [IU] via SUBCUTANEOUS
  Administered 2018-02-25: 3 [IU] via SUBCUTANEOUS
  Administered 2018-02-25: 2 [IU] via SUBCUTANEOUS
  Administered 2018-02-25: 3 [IU] via SUBCUTANEOUS
  Administered 2018-02-26: 2 [IU] via SUBCUTANEOUS
  Administered 2018-02-26 (×2): 3 [IU] via SUBCUTANEOUS
  Administered 2018-02-26 – 2018-02-28 (×5): 2 [IU] via SUBCUTANEOUS
  Administered 2018-02-28: 1 [IU] via SUBCUTANEOUS
  Administered 2018-02-28 – 2018-03-03 (×8): 2 [IU] via SUBCUTANEOUS
  Administered 2018-03-04 (×2): 3 [IU] via SUBCUTANEOUS
  Administered 2018-03-04: 2 [IU] via SUBCUTANEOUS
  Administered 2018-03-04 – 2018-03-05 (×2): 3 [IU] via SUBCUTANEOUS
  Administered 2018-03-05 – 2018-03-06 (×4): 2 [IU] via SUBCUTANEOUS
  Administered 2018-03-06: 3 [IU] via SUBCUTANEOUS
  Administered 2018-03-06: 2 [IU] via SUBCUTANEOUS

## 2018-02-16 MED ORDER — LABETALOL HCL 5 MG/ML IV SOLN
20.0000 mg | Freq: Once | INTRAVENOUS | Status: AC
  Administered 2018-02-16: 20 mg via INTRAVENOUS
  Filled 2018-02-16: qty 4

## 2018-02-16 MED ORDER — ACETAMINOPHEN 325 MG PO TABS
650.0000 mg | ORAL_TABLET | ORAL | Status: DC | PRN
  Administered 2018-02-25 – 2018-03-03 (×2): 650 mg via ORAL
  Filled 2018-02-16: qty 2

## 2018-02-16 MED ORDER — HYDRALAZINE HCL 20 MG/ML IJ SOLN
10.0000 mg | Freq: Once | INTRAMUSCULAR | Status: DC
  Filled 2018-02-16: qty 1

## 2018-02-16 MED ORDER — LABETALOL HCL 100 MG PO TABS
200.0000 mg | ORAL_TABLET | Freq: Two times a day (BID) | ORAL | Status: DC
  Administered 2018-02-17 – 2018-02-20 (×7): 200 mg via ORAL
  Filled 2018-02-16 (×7): qty 2

## 2018-02-16 MED ORDER — PANTOPRAZOLE SODIUM 40 MG IV SOLR
40.0000 mg | Freq: Every day | INTRAVENOUS | Status: DC
  Administered 2018-02-17 – 2018-02-20 (×5): 40 mg via INTRAVENOUS
  Filled 2018-02-16 (×5): qty 40

## 2018-02-16 MED ORDER — LABETALOL HCL 5 MG/ML IV SOLN
10.0000 mg | INTRAVENOUS | Status: DC | PRN
  Filled 2018-02-16: qty 4

## 2018-02-16 MED ORDER — ONDANSETRON HCL 4 MG/2ML IJ SOLN
4.0000 mg | Freq: Once | INTRAMUSCULAR | Status: AC
  Administered 2018-02-16: 4 mg via INTRAVENOUS
  Filled 2018-02-16: qty 2

## 2018-02-16 MED ORDER — LABETALOL HCL 5 MG/ML IV SOLN
INTRAVENOUS | Status: AC
  Filled 2018-02-16: qty 4

## 2018-02-16 MED ORDER — SODIUM CHLORIDE 0.9 % IV SOLN
250.0000 mL | INTRAVENOUS | Status: DC | PRN
  Administered 2018-02-24: 12:00:00 via INTRAVENOUS

## 2018-02-16 MED ORDER — SODIUM CHLORIDE 0.9 % IV BOLUS (SEPSIS)
1000.0000 mL | Freq: Once | INTRAVENOUS | Status: AC
  Administered 2018-02-16: 1000 mL via INTRAVENOUS

## 2018-02-16 MED ORDER — LABETALOL HCL 5 MG/ML IV SOLN
0.5000 mg/min | INTRAVENOUS | Status: DC
  Administered 2018-02-16: 1 mg/min via INTRAVENOUS
  Filled 2018-02-16: qty 80

## 2018-02-16 MED ORDER — LABETALOL HCL 5 MG/ML IV SOLN
20.0000 mg | INTRAVENOUS | Status: AC | PRN
  Administered 2018-02-16 – 2018-02-17 (×2): 20 mg via INTRAVENOUS

## 2018-02-16 MED ORDER — LABETALOL HCL 5 MG/ML IV SOLN
INTRAVENOUS | Status: AC
  Administered 2018-02-16: 20 mg via INTRAVENOUS
  Filled 2018-02-16: qty 8

## 2018-02-16 MED ORDER — CLEVIDIPINE BUTYRATE 0.5 MG/ML IV EMUL
0.0000 mg/h | INTRAVENOUS | Status: DC
  Administered 2018-02-16: 18 mg/h via INTRAVENOUS
  Administered 2018-02-16: 0.5 mg/h via INTRAVENOUS
  Administered 2018-02-16: 21 mg/h via INTRAVENOUS
  Administered 2018-02-17: 20 mg/h via INTRAVENOUS
  Administered 2018-02-17: 18 mg/h via INTRAVENOUS
  Administered 2018-02-17 (×2): 21 mg/h via INTRAVENOUS
  Administered 2018-02-17 (×2): 16 mg/h via INTRAVENOUS
  Administered 2018-02-17: 18 mg/h via INTRAVENOUS
  Administered 2018-02-17: 21 mg/h via INTRAVENOUS
  Administered 2018-02-18: 18 mg/h via INTRAVENOUS
  Administered 2018-02-18 (×2): 16 mg/h via INTRAVENOUS
  Administered 2018-02-18: 18 mg/h via INTRAVENOUS
  Administered 2018-02-18: 16 mg/h via INTRAVENOUS
  Administered 2018-02-18: 18 mg/h via INTRAVENOUS
  Administered 2018-02-18: 16 mg/h via INTRAVENOUS
  Administered 2018-02-19: 8 mg/h via INTRAVENOUS
  Administered 2018-02-19: 1 mg/h via INTRAVENOUS
  Filled 2018-02-16 (×21): qty 50

## 2018-02-16 MED ORDER — SENNOSIDES-DOCUSATE SODIUM 8.6-50 MG PO TABS
1.0000 | ORAL_TABLET | Freq: Two times a day (BID) | ORAL | Status: DC
  Administered 2018-02-17 – 2018-03-06 (×21): 1 via ORAL
  Filled 2018-02-16 (×24): qty 1

## 2018-02-16 MED ORDER — DEXAMETHASONE SODIUM PHOSPHATE 10 MG/ML IJ SOLN
10.0000 mg | Freq: Once | INTRAMUSCULAR | Status: AC
  Administered 2018-02-16: 10 mg via INTRAVENOUS
  Filled 2018-02-16: qty 1

## 2018-02-16 MED ORDER — LABETALOL HCL 5 MG/ML IV SOLN
20.0000 mg | Freq: Once | INTRAVENOUS | Status: AC
  Administered 2018-02-16: 20 mg via INTRAVENOUS

## 2018-02-16 MED ORDER — ACETAMINOPHEN 160 MG/5ML PO SOLN: 650.0000 mg | ORAL | Status: DC | PRN

## 2018-02-16 MED ORDER — ACETAMINOPHEN 650 MG RE SUPP: 650.0000 mg | RECTAL | Status: DC | PRN

## 2018-02-16 MED ORDER — NICARDIPINE HCL IN NACL 20-0.86 MG/200ML-% IV SOLN
3.0000 mg/h | Freq: Once | INTRAVENOUS | Status: AC
  Administered 2018-02-16: 5 mg/h via INTRAVENOUS
  Filled 2018-02-16: qty 200

## 2018-02-16 MED ORDER — STROKE: EARLY STAGES OF RECOVERY BOOK
Freq: Once | Status: AC
  Administered 2018-02-27: 17:00:00
  Filled 2018-02-16: qty 1

## 2018-02-16 MED ORDER — HYDRALAZINE HCL 20 MG/ML IJ SOLN
10.0000 mg | Freq: Once | INTRAMUSCULAR | Status: AC
  Administered 2018-02-16: 10 mg via INTRAVENOUS

## 2018-02-16 NOTE — Progress Notes (Signed)
Per neurologist I will use cleviprex until maxed out and only add in labetolol if needed to keep SBP 160-170

## 2018-02-16 NOTE — ED Triage Notes (Signed)
Patient arrived via [facility] from home. Family found patient on the floor. Patient fell yesterday at 6:00 pm. 18 hours on the floor laying on the left side. Whole left side of body swollen. Patient c/o of slight headache and nausea. No neck or back pain. Patient says he did not fall and hit his head but a chair fell on him and hit his head. Patient had urinated on himself. Patient has not taken his medications today.

## 2018-02-16 NOTE — Procedures (Signed)
Arterial Catheter Insertion Procedure Note Loleta ChanceReginald F Macdowell [id number] 11-May-1966  Procedure: Insertion of Arterial Catheter  Indications: Blood pressure monitoring and Frequent blood sampling  Procedure Details Consent: Risks of procedure as well as the alternatives and risks of each were explained to the (patient/caregiver).  Consent for procedure obtained. Time Out: Verified patient identification, verified procedure, site/side was marked, verified correct patient position, special equipment/implants available, medications/allergies/relevent history reviewed, required imaging and test results available.  Performed  Maximum sterile technique was used including antiseptics, cap, gloves, gown, hand hygiene, mask and sheet. Skin prep: Chlorhexidine; local anesthetic administered 20 gauge catheter was inserted into right radial artery using the Seldinger technique.  Evaluation Blood flow good; BP tracing good. Complications: No apparent complications.   Hiram ComberJoseph R Shalah Estelle 02/16/2018

## 2018-02-16 NOTE — ED Notes (Signed)
Pt wife, Enrique SackKendra called and updated that [facility] is going [facility]. [facility].

## 2018-02-16 NOTE — Progress Notes (Signed)
Verbal order from Dr. Laurence SlateAroor that he is okay with SBP 160-170 for the moment

## 2018-02-16 NOTE — ED Notes (Signed)
Pt left with [facility] 

## 2018-02-16 NOTE — Procedures (Signed)
Central Venous Catheter Insertion Procedure Note Loleta ChanceReginald F Boesen [id number] 21-Nov-1966  Procedure: Insertion of Central Venous Catheter Indications: Assessment of intravascular volume, Drug and/or fluid administration and Frequent blood sampling  Procedure Details Consent: Unable to obtain consent because of emergent medical necessity. Time Out: Verified patient identification, verified procedure, site/side was marked, verified correct patient position, special equipment/implants available, medications/allergies/relevent history reviewed, required imaging and test results available.  Performed  Maximum sterile technique was used including antiseptics, cap, gloves, gown, hand hygiene, mask and sheet. Skin prep: Chlorhexidine; local anesthetic administered A antimicrobial bonded/coated triple lumen catheter was placed in the right internal jugular vein using the Seldinger technique.  Evaluation Blood flow good Complications: No apparent complications Patient did tolerate procedure well. Chest X-ray ordered to verify placement.  CXR: pending.  Jovita KussmaulKatalina Eubanks, AGACNP-BC [facility]  Pgr: [phone]  PCCM Pgr: [phone]

## 2018-02-16 NOTE — Consult Note (Signed)
   [facility] Tele[facility]urology Consult Services  Date of Service: 02/16/18  Impression:  L sided weakness - concerning for right hemisphere stroke. Recommend CT head. He is not a candidate for IV tpa as he is beyond the 4.5 hour time window. He is also right at 24 hours, so he is not a candidate for endovascular treatment. By the time we had the CTA results back, it would be past the 24 hour mark. Therefore, given the renal failure, will hold off on CTA.   Recommendations:  MRI/A head/neck ASA if CT head is neg for hemorrhage. LDL a1c tele TTE DVT proph - lovenox permissive htn PT/OT/speech bedside swallow eval  ---------------------------------------------------------------------  CC: L sided weakness  History of Present Illness:  52 yo man with acute onset left sided weakness. He was LSN at approx 1800 yesterday evening. No LOC/convulsion. He was on the floor for approx 1800. He has slurred speech and some movement of the left side, but not as well as the right side. No trouble with understanding language or finding words.   Diagnostic Testing: CT head pending.  Vital Signs:   Vitals:   02/16/18 1709 02/16/18 1725  BP: (!) 245/132   Pulse: 87   Resp: 18   Temp:  97.7 F (36.5 C)  SpO2: 98%     Exam:   RESULT SUMMARY: 11 points NIH Stroke Scale   INPUTS: 1A: Level of consciousness -> 0 = Alert; keenly responsive 1B: Ask month and age -> 0 = Both questions right 1C: 'Blink eyes' & 'squeeze hands' -> 0 = Performs both tasks 2: Horizontal extraocular movements -> 0 = Normal 3: Visual fields -> 0 = No visual loss 4: Facial palsy -> 2 = Partial paralysis (lower face) 5A: Left arm motor drift -> 4 = No movement 5B: Right arm motor drift -> 0 = No drift for 10 seconds 6A: Left leg motor drift -> 4 = No movement 6B: Right leg motor drift -> 0 = No drift for 5 seconds 7: Limb Ataxia -> 0 = No ataxia 8: Sensation -> 1 = Mild-moderate loss: less sharp/more dull  9:  Language/aphasia -> 0 = Normal; no aphasia 10: Dysarthria -> 0 = Normal 11: Extinction/inattention -> 0 = No abnormality    Medical Decision Making:  - Extensive number of diagnosis or management options are considered above.   - Extensive amount of complex data reviewed.   - High risk of complication and/or morbidity or mortality are associated with differential diagnostic considerations above.  - There may be uncertain outcome and increased probability of prolonged functional impairment or high probability of severe prolonged functional impairment associated with some of these differential diagnosis.   Medical Data Reviewed:  1.Data reviewed include clinical labs, radiology,  Medical Tests;   2.Tests results discussed w/performing or interpreting physician;   3.Obtaining/reviewing old medical records;  4.Obtaining case history from another source;  5.Independent review of image, tracing or specimen.    Patient was informed the Neurology Consult would happen via telehealth (remote video) and consented to receiving care in this manner.

## 2018-02-16 NOTE — ED Notes (Signed)
[facility] arrived  

## 2018-02-16 NOTE — ED Notes (Signed)
Unable to do stroke swallow screen due to patients condition. Would be unsafe to do swallow screen.

## 2018-02-16 NOTE — ED Notes (Signed)
Report given to [facility] 

## 2018-02-16 NOTE — ED Notes (Signed)
Transferring patient to [facility] stretcher, unhooked from our monitors.

## 2018-02-16 NOTE — ED Notes (Signed)
Pt gave verbal consent for transfer, but is unable to sign d/t his condition,

## 2018-02-16 NOTE — Consult Note (Addendum)
Name: [name] [name] MRN: [id number] DOB: 1966/02/10    ADMISSION DATE:  02/16/2018 CONSULTATION DATE:  02/16/2018  REFERRING MD :  Dr. Laurence Slate   CHIEF COMPLAINT:  ICH   HISTORY OF PRESENT ILLNESS:   52 year old male with CKD stage IV, Uncontrolled HTN, DM, Diastolic Heart Failure (EF 50-55)  Presents to ED on 3/17 after being found on floor laying on the left side at 6 pm 3/16. On floor for estimated 18 hours. Presented to ED with headache, nausea, and left sided weakness with slurred speech. BP on arrival 245/132. CT head with acute right basal ganglia hemorrhage with midline shift about 9 mm. Transferred to [facility] for further care. Arrived on Cleviprex with systolic in the 200s. PCCM asked to consult.   SIGNIFICANT EVENTS  3/17 > Presents to ED   STUDIES:  CT Head 3/17 > 1. Acute right basal ganglia hemorrhage with estimated blood volume of 28 mL. Underlying chronic cerebral ischemia, including previous right PCA infarct. 2. Surrounding edema with regional mass effect including leftward midline shift up to 9 mm. Basilar cisterns are patent. 3. Subtotal effacement of the right lateral ventricle, but no intraventricular extension of blood and no ventriculomegaly. 4. Large left scalp hematoma. No underlying skull fracture identified. 5.  No acute fracture in the cervical spine. CXR 3/17 > 1. Confluent left lower lobe opacity compatible with collapse or consolidation. 2. Right lung ventilation is within normal limits. 3. Chronic cardiomegaly.  PAST MEDICAL HISTORY :   has a past medical history of Cellulitis and abscess of leg (01/08/2014), Diabetes mellitus, Diverticulitis, and Hypertension.  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 Prior to Admission medications   Medication Sig Start Date End Date Taking? Authorizing Provider  amLODipine (NORVASC) 10 MG tablet Take 1 tablet  (10 mg total) by mouth daily. 01/19/14   Ghimire, Werner Lean, MD  furosemide (LASIX) 20 MG tablet Take 40 mg by mouth daily.     [provider]  Insulin Detemir (LEVEMIR) 100 UNIT/ML Pen Inject 22 Units into the skin daily at 10 pm. 01/19/14   Ghimire, Werner Lean, MD  labetalol (NORMODYNE) 200 MG tablet Take 200 mg by mouth 2 (two) times daily.     [provider]  LORazepam (ATIVAN) 0.5 MG tablet Take 0.5 mg by mouth 2 (two) times daily as needed for anxiety.    [provider]  tamsulosin (FLOMAX) 0.4 MG CAPS capsule Take one capsule daily until stone passes. Patient not taking: Reported on 02/16/2018 03/23/17   Molpus, Jonny Ruiz, MD   Allergies  Allergen Reactions  . Minocycline Anaphylaxis    SOB, itching, hives.  . Shellfish Allergy Nausea And Vomiting    FAMILY HISTORY:  family history includes Diabetes Mellitus II in his father; Hypertension in his mother. SOCIAL HISTORY:  reports that  has never smoked. he has never used smokeless tobacco. He reports that he does not drink alcohol or use drugs.  REVIEW OF SYSTEMS:   All negative; except for those that are bolded, which indicate positives.  Constitutional: weight loss, weight gain, night sweats, fevers, chills, fatigue, weakness.  HEENT: headaches, sore throat, sneezing, nasal congestion, post nasal drip, difficulty swallowing, tooth/dental problems, visual complaints, visual changes, ear aches. Neuro: difficulty with speech, weakness, numbness, ataxia. CV:  chest pain, orthopnea, PND, swelling in lower extremities, dizziness, palpitations, syncope.  Resp: cough, hemoptysis, dyspnea, wheezing. GI: heartburn, indigestion, abdominal pain, nausea,  vomiting, diarrhea, constipation, change in bowel habits, loss of appetite, hematemesis, melena, hematochezia.  GU: dysuria, change in color of urine, urgency or frequency, flank pain, hematuria. MSK: joint pain or swelling, decreased range of motion. Psych: change in  mood or affect, depression, anxiety, suicidal ideations, homicidal ideations. Skin: rash, itching, bruising.  SUBJECTIVE:   VITAL SIGNS: Temp:  [97.7 F (36.5 C)] 97.7 F (36.5 C) (03/17 1725) Pulse Rate:  [77-88] 77 (03/17 2046) Resp:  [18-33] 26 (03/17 2046) BP: (181-251)/(72-190) 210/115 (03/17 2046) SpO2:  [89 %-98 %] 97 % (03/17 2046)  PHYSICAL EXAMINATION: General:  Obese adult male, no distress  Neuro:  Follows commands, does not open eyes, pupils intact, oriented  HEENT:  MMM  Cardiovascular:  RRR, no MRG  Lungs:  Clear breath sounds, no wheeze  Abdomen:  Obese, active bowel sounds  Musculoskeletal:  -edema  Skin:  Warm, dry, intact   Recent Labs  Lab 02/16/18 1714 02/16/18 1721  NA 141 143  K 4.9 5.0  CL 111 111  CO2 21*  --   BUN 38* 36*  CREATININE 3.78* 3.90*  GLUCOSE 131* 129*   Recent Labs  Lab 02/16/18 1714 02/16/18 1721  HGB 10.5* 11.6*  HCT 33.2* 34.0*  WBC 8.6  --   PLT 331  --    Dg Chest 1 View  Result Date: 02/16/2018 CLINICAL DATA:  52 year old male found down. Suspected fall. Basal ganglia cerebral hemorrhage. EXAM: CHEST  1 VIEW COMPARISON:  Chest radiographs 10/22/2017. FINDINGS: Portable AP semi upright view at 1801 hours. Lower lung volumes. Confluent retrocardiac opacity obscuring the left hemidiaphragm. Stable mild cardiomegaly. Other mediastinal contours are within normal limits. Visualized tracheal air column is within normal limits. The right lung appears clear allowing for portable technique. IMPRESSION: 1. Confluent left lower lobe opacity compatible with collapse or consolidation. 2. Right lung ventilation is within normal limits. 3. Chronic cardiomegaly. Electronically Signed   By: Odessa FlemingH  Hall M.D.   On: 02/16/2018 19:14   Dg Pelvis 1-2 Views  Result Date: 02/16/2018 CLINICAL DATA:  52 year old male found down. Suspected fall. Basal ganglia cerebral hemorrhage. EXAM: PELVIS - 1-2 VIEW COMPARISON:  CT Abdomen and Pelvis 03/28/2017.  FINDINGS: Femoral heads are normally located. Hip joint spaces remain normal. The proximal femurs appear intact. The pelvis appears stable and intact. Bone mineralization is within normal limits. Negative visible bowel gas pattern. IMPRESSION: No acute fracture or dislocation identified about the pelvis. Electronically Signed   By: Odessa FlemingH  Hall M.D.   On: 02/16/2018 19:15   Ct Head Wo Contrast  Addendum Date: 02/16/2018   ADDENDUM REPORT: 02/16/2018 18:43 ADDENDUM: Critical Value/emergent results were called by telephone at the time of interpretation on 02/16/2018 at 1824 hours. to Dr. Chaney MallingAVID YAO , who verbally acknowledged these results. Electronically Signed   By: Odessa FlemingH  Hall M.D.   On: 02/16/2018 18:43   Result Date: 02/16/2018 CLINICAL DATA:  52 year old male found down. EXAM: CT HEAD WITHOUT CONTRAST CT CERVICAL SPINE WITHOUT CONTRAST TECHNIQUE: Multidetector CT imaging of the head and cervical spine was performed following the standard protocol without intravenous contrast. Multiplanar CT image reconstructions of the cervical spine were also generated. COMPARISON:  None. FINDINGS: Study is mildly degraded by motion artifact despite repeated imaging attempts. CT HEAD FINDINGS Brain: Oval hyperdense intra-axial hemorrhage centered at the right basal ganglia encompassing 57 x 30 x 33 millimeters (AP by transverse by CC) for an estimated blood volume of 28 milliliters. Surrounding edema. Mass effect with leftward midline  shift of 8-9 millimeters. Effaced right lateral ventricle, but no intraventricular hemorrhage. No ventriculomegaly. No other acute intracranial hemorrhage identified. The basilar cisterns are normal. Chronic appearing encephalomalacia in the right occipital pole. Confluent bilateral cerebral white matter hypodensity. Vascular: Degraded by motion, No suspicious intracranial vascular hyperdensity. Skull: Bone detail limited by motion, no acute osseous abnormality identified. Sinuses/Orbits: Clear. Other:  Large generalized left side scalp and face superficial soft tissue hematoma measuring up to 11 millimeters in thickness. The forehead is affected. The findings continue along the lateral and anterior left orbit. Grossly negative orbits soft tissues, degraded by motion. The right scalp appears relatively spared. CT CERVICAL SPINE FINDINGS Alignment: Straightening and mild reversal of cervical lordosis. Cervicothoracic junction alignment is within normal limits. Bilateral posterior element alignment is within normal limits. Skull base and vertebrae: Lower cervical spine bone detail is decreased by large body habitus. Visualized skull base is intact. No atlanto-occipital dissociation. No cervical spine fracture identified. Soft tissues and spinal canal: There is a retropharyngeal course of both carotid arteries in the neck. Grossly negative noncontrast deep paraspinal soft tissues. Disc levels: There is broad-based right foraminal disc osteophyte degeneration at C3-C4 with severe right C4 foraminal stenosis. No other significant cervical spine degeneration is evident. Upper chest: The visible T1 level appears intact. IMPRESSION: 1. Acute right basal ganglia hemorrhage with estimated blood volume of 28 mL. Underlying chronic cerebral ischemia, including previous right PCA infarct. 2. Surrounding edema with regional mass effect including leftward midline shift up to 9 mm. Basilar cisterns are patent. 3. Subtotal effacement of the right lateral ventricle, but no intraventricular extension of blood and no ventriculomegaly. 4. Large left scalp hematoma. No underlying skull fracture identified. 5.  No acute fracture in the cervical spine. Electronically Signed: By: Odessa Fleming M.D. On: 02/16/2018 18:23   Ct Cervical Spine Wo Contrast  Addendum Date: 02/16/2018   ADDENDUM REPORT: 02/16/2018 18:43 ADDENDUM: Critical Value/emergent results were called by telephone at the time of interpretation on 02/16/2018 at 1824 hours. to Dr.  Chaney Malling , who verbally acknowledged these results. Electronically Signed   By: Odessa Fleming M.D.   On: 02/16/2018 18:43   Result Date: 02/16/2018 CLINICAL DATA:  52 year old male found down. EXAM: CT HEAD WITHOUT CONTRAST CT CERVICAL SPINE WITHOUT CONTRAST TECHNIQUE: Multidetector CT imaging of the head and cervical spine was performed following the standard protocol without intravenous contrast. Multiplanar CT image reconstructions of the cervical spine were also generated. COMPARISON:  None. FINDINGS: Study is mildly degraded by motion artifact despite repeated imaging attempts. CT HEAD FINDINGS Brain: Oval hyperdense intra-axial hemorrhage centered at the right basal ganglia encompassing 57 x 30 x 33 millimeters (AP by transverse by CC) for an estimated blood volume of 28 milliliters. Surrounding edema. Mass effect with leftward midline shift of 8-9 millimeters. Effaced right lateral ventricle, but no intraventricular hemorrhage. No ventriculomegaly. No other acute intracranial hemorrhage identified. The basilar cisterns are normal. Chronic appearing encephalomalacia in the right occipital pole. Confluent bilateral cerebral white matter hypodensity. Vascular: Degraded by motion, No suspicious intracranial vascular hyperdensity. Skull: Bone detail limited by motion, no acute osseous abnormality identified. Sinuses/Orbits: Clear. Other: Large generalized left side scalp and face superficial soft tissue hematoma measuring up to 11 millimeters in thickness. The forehead is affected. The findings continue along the lateral and anterior left orbit. Grossly negative orbits soft tissues, degraded by motion. The right scalp appears relatively spared. CT CERVICAL SPINE FINDINGS Alignment: Straightening and mild reversal of cervical lordosis. Cervicothoracic junction  alignment is within normal limits. Bilateral posterior element alignment is within normal limits. Skull base and vertebrae: Lower cervical spine bone detail is  decreased by large body habitus. Visualized skull base is intact. No atlanto-occipital dissociation. No cervical spine fracture identified. Soft tissues and spinal canal: There is a retropharyngeal course of both carotid arteries in the neck. Grossly negative noncontrast deep paraspinal soft tissues. Disc levels: There is broad-based right foraminal disc osteophyte degeneration at C3-C4 with severe right C4 foraminal stenosis. No other significant cervical spine degeneration is evident. Upper chest: The visible T1 level appears intact. IMPRESSION: 1. Acute right basal ganglia hemorrhage with estimated blood volume of 28 mL. Underlying chronic cerebral ischemia, including previous right PCA infarct. 2. Surrounding edema with regional mass effect including leftward midline shift up to 9 mm. Basilar cisterns are patent. 3. Subtotal effacement of the right lateral ventricle, but no intraventricular extension of blood and no ventriculomegaly. 4. Large left scalp hematoma. No underlying skull fracture identified. 5.  No acute fracture in the cervical spine. Electronically Signed: By: Odessa Fleming M.D. On: 02/16/2018 18:23    ASSESSMENT / PLAN:  Acute Right Basal Ganglia Hemorrhage with 9 mm leftward midline shift Plan  -Per Neurology  -Frequent Neuro Checks -Repeat Head CT at 0100 > Will evaluate need for hypertonic   Respiratory Insufficiency in setting of ICH  Presumed OSA/OHS Plan -Currently protecting airway -Monitor  -Maintain Oxygen Saturation >92 -Trend CXR   HTN Emergency  -Presented 245/132 Diastolic Heart Failure (EF 50-55)  H/O Uncontrolled HTN (Baseline Systolic per Wife 200s)  Plan  -Cardiac Monitoring  -Systolic Goal <160, Currently max on Cleviprex, Added Labetalol gtt and wean down Cleviprex  -RT to place A-Line  -ECHO pending   Acute on Chronic Kidney Disease Stage IV -Was seen by vascular for vein mapping for Fistula, however, patient did not want to proceed at that time  Plan    -Trend BMP  -Replace electrolytes as indicated  -Avoid Nephrotoxic Medications  -Currently with adequate urinary output   DM  Plan  -Trend Glucose  -SSI   VTE Prophylaxis Plan  -Hold all anticoagulation in setting of ICH  -SCDs only     Jovita Kussmaul, AGACNP-BC [facility]  Pgr: [phone]  PCCM Pgr: [phone]

## 2018-02-16 NOTE — ED Notes (Signed)
Bed: WA09 Expected date:  Expected time:  Means of arrival:  Comments: EMS 

## 2018-02-16 NOTE — ED Provider Notes (Signed)
[facility] NEURO/TRAUMA/SURGICAL ICU Provider Note   CSN: [id number] Arrival date & time: 02/16/18  1642     History   Chief Complaint Chief Complaint  Patient presents with  . Fall    HPI [name] [name] is a 52 y.o. male w PMHx CKD, insulin-dependent T2DM, HTN, presenting to ED via EMS. Pt was found down by family prior to arrival. Pt reports around 6PM last night, he was sitting in a desk chair and dropped his phone.  He states he leaned over the left side of his chair to get his phone and the chair slid out from under him.  He states he fell onto his left side and the chair hit him in the head.  He denies LOC.  States he has been laying on his left side ever since that time, as he was unable to get up due to weakness.  Now he endorses mild headache and mild nausea.  He denies vision changes, chest pain, neck or back pain, or other complaints.  Per EMS, CBG was 123, and patient hypertensive.  The history is provided by the patient.    Past Medical History:  Diagnosis Date  . Cellulitis and abscess of leg 01/08/2014   RT LEG  . Diabetes mellitus   . Diverticulitis   . Hypertension     Patient Active Problem List   Diagnosis Date Noted  . Hemorrhagic stroke (HCC) 02/16/2018  . Chronic kidney disease (CKD), stage IV (severe) (HCC) 12/13/2017  . Morbid obesity (HCC) 10/07/2014  . DOE (dyspnea on exertion) 08/26/2014  . Insulin dependent type 2 diabetes mellitus, uncontrolled (HCC) 01/11/2014  . 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 Cellulitis and abscess 01/08/2014  . Malignant hypertension 01/08/2014  . Diabetes mellitus, type 2 (HCC) 01/08/2014  . Sepsis (HCC) 01/08/2014    Past Surgical History:  Procedure Laterality Date  . COLON SURGERY    . I&D EXTREMITY Right 01/09/2014   Procedure: IRRIGATION AND DEBRIDEMENT EXTREMITY;  Surgeon: Senaida Lange, MD;  Location: [facility] OR;  Service: Orthopedics;  Laterality: Right;  . 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 Authorizing Provider  amLODipine (NORVASC) 10 MG tablet Take 1 tablet (10 mg total) by mouth daily. 01/19/14   Ghimire, Werner Lean, MD  furosemide (LASIX) 20 MG tablet Take 40 mg by mouth daily.     [provider]  Insulin Detemir (LEVEMIR) 100 UNIT/ML Pen Inject 22 Units into the skin daily at 10 pm. 01/19/14   Ghimire, Werner Lean, MD  labetalol (NORMODYNE) 200 MG tablet Take 200 mg by mouth 2 (two) times daily.     [provider]  LORazepam (ATIVAN) 0.5 MG tablet Take 0.5 mg by mouth 2 (two) times daily as needed for anxiety.    [provider]  tamsulosin (FLOMAX) 0.4 MG CAPS capsule Take one capsule daily until stone passes. Patient not taking: Reported on 02/16/2018 03/23/17   Molpus, Jonny Ruiz, MD    Family History Family History  Problem Relation Age of Onset  . Hypertension Mother   . Diabetes Mellitus II Father     Social History Social History   Tobacco Use  . Smoking status: Never Smoker  . Smokeless tobacco: Never Used  Substance Use Topics  .  Alcohol use: No    Comment: SOCIAL  . Drug use: No     Allergies   Minocycline and Shellfish allergy   Review of Systems Review of Systems  Respiratory: Negative for shortness of breath.   Cardiovascular: Negative for chest pain.  Gastrointestinal: Positive for nausea. Negative for abdominal pain.  Musculoskeletal: Negative for back pain and neck pain.  Neurological: Positive for headaches.  Hematological: Does not bruise/bleed easily.  All other systems reviewed and are negative.    Physical Exam Updated Vital Signs BP (!) 210/115 Comment: gtt maxed; md paged   Pulse 77   Temp 97.7 F (36.5 C) (Oral)   Resp (!) 26   SpO2 97%   Physical Exam    Constitutional: He appears well-developed and well-nourished.  Patient is alert, morbidly obese, leaning towards the left side with obvious weakness.  HENT:  Scalp with edema, L>R, no hematoma or tenderness. Periorbital edema L>R. No deformities, no tenderness, no ecchymosis  Eyes: EOM are normal. Pupils are equal, round, and reactive to light.  B/l conjunctival edema L>R  Neck: Normal range of motion. Neck supple.  Cardiovascular: Normal rate, regular rhythm and intact distal pulses.  Pulmonary/Chest: Effort normal and breath sounds normal.  Abdominal: Soft. Bowel sounds are normal. There is no tenderness. There is no guarding.  Musculoskeletal:  LUE with swelling, 3+ pitting edema to BLE, trunk is edematous. Extremities appear atraumatic.  Right Midfoot amputation (chronic)   Neurological: He is alert.  Left hemianopsia, left facial droop, slurred speech, left upper and lower extremity paralysis. Mental Status:  Alert, oriented, thought content appropriate, able to give a coherent history. Speech is slurred. Able to follow 2 step commands without difficulty.  Cranial Nerves:  II:  Left hemianopsia, pupils equal, round, reactive to light III,IV, VI: mild ptosis on left, extra-ocular motions intact bilaterally  V,VII: smile asymmetric with left droop, facial light touch sensation equal VIII: hearing grossly normal to voice  X: uvula elevates symmetrically  XI: shoulder shrug weak on left XII: tongue extending towards left without fassiculations Motor:  Left UE and LE with dec tone and hemiparesis to LUE and LLE. CV: distal pulses palpable throughout    Skin: Skin is warm.  Psychiatric: He has a normal mood and affect. His behavior is normal.  Nursing note and vitals reviewed.    ED Treatments / Results  Labs (all labs ordered are listed, but only abnormal results are displayed) Labs Reviewed  CBC WITH DIFFERENTIAL/PLATELET - Abnormal; Notable for the following components:       Result Value   RBC 3.95 (*)    Hemoglobin 10.5 (*)    HCT 33.2 (*)    RDW 15.9 (*)    All other components within normal limits  COMPREHENSIVE METABOLIC PANEL - Abnormal; Notable for the following components:   CO2 21 (*)    Glucose, Bld 131 (*)    BUN 38 (*)    Creatinine, Ser 3.78 (*)    Albumin 3.0 (*)    GFR calc non Af Amer 17 (*)    GFR calc Af Amer 20 (*)    All other components within normal limits  URINALYSIS, ROUTINE W REFLEX MICROSCOPIC - Abnormal; Notable for the following components:   Glucose, UA 50 (*)    Hgb urine dipstick SMALL (*)    Ketones, ur 5 (*)    Protein, ur >=300 (*)    Bacteria, UA RARE (*)    All other components within normal limits  GLUCOSE, CAPILLARY - Abnormal; Notable for the following components:   Glucose-Capillary 139 (*)    All other components within normal limits  I-STAT CHEM 8, ED - Abnormal; Notable for the following components:   BUN 36 (*)    Creatinine, Ser 3.90 (*)    Glucose, Bld 129 (*)    TCO2 20 (*)    Hemoglobin 11.6 (*)    HCT 34.0 (*)    All other components within normal limits  MRSA PCR SCREENING  CK  RAPID URINE DRUG SCREEN, HOSP PERFORMED  TRIGLYCERIDES  ETHANOL  BASIC METABOLIC PANEL  CBC  MAGNESIUM  PHOSPHORUS  I-STAT TROPONIN, ED  I-STAT CHEM 8, ED    EKG  EKG Interpretation  Date/Time:  Sunday February 16 2018 17:08:25 EDT Ventricular Rate:  86 PR Interval:    QRS Duration: 111 QT Interval:  427 QTC Calculation: 511 R Axis:   -146 Text Interpretation:  Sinus rhythm Prolonged PR interval Probable left atrial enlargement Anterior infarct, old Prolonged QT interval No previous ECGs available Confirmed by Richardean Canal [phone]) on 02/16/2018 5:20:30 PM       Radiology Dg Chest 1 View  Result Date: 02/16/2018 CLINICAL DATA:  53 year old male found down. Suspected fall. Basal ganglia cerebral hemorrhage. EXAM: CHEST  1 VIEW COMPARISON:  Chest radiographs 10/22/2017. FINDINGS: Portable AP semi  upright view at 1801 hours. Lower lung volumes. Confluent retrocardiac opacity obscuring the left hemidiaphragm. Stable mild cardiomegaly. Other mediastinal contours are within normal limits. Visualized tracheal air column is within normal limits. The right lung appears clear allowing for portable technique. IMPRESSION: 1. Confluent left lower lobe opacity compatible with collapse or consolidation. 2. Right lung ventilation is within normal limits. 3. Chronic cardiomegaly. Electronically Signed   By: Odessa Fleming M.D.   On: 02/16/2018 19:14   Dg Pelvis 1-2 Views  Result Date: 02/16/2018 CLINICAL DATA:  52 year old male found down. Suspected fall. Basal ganglia cerebral hemorrhage. EXAM: PELVIS - 1-2 VIEW COMPARISON:  CT Abdomen and Pelvis 03/28/2017. FINDINGS: Femoral heads are normally located. Hip joint spaces remain normal. The proximal femurs appear intact. The pelvis appears stable and intact. Bone mineralization is within normal limits. Negative visible bowel gas pattern. IMPRESSION: No acute fracture or dislocation identified about the pelvis. Electronically Signed   By: Odessa Fleming M.D.   On: 02/16/2018 19:15   Ct Head Wo Contrast  Addendum Date: 02/16/2018   ADDENDUM REPORT: 02/16/2018 18:43 ADDENDUM: Critical Value/emergent results were called by telephone at the time of interpretation on 02/16/2018 at 1824 hours. to Dr. Chaney Malling , who verbally acknowledged these results. Electronically Signed   By: Odessa Fleming M.D.   On: 02/16/2018 18:43   Result Date: 02/16/2018 CLINICAL DATA:  52 year old male found down. EXAM: CT HEAD WITHOUT CONTRAST CT CERVICAL SPINE WITHOUT CONTRAST TECHNIQUE: Multidetector CT imaging of the head and cervical spine was performed following the standard protocol without intravenous contrast. Multiplanar CT image reconstructions of the cervical spine were also generated. COMPARISON:  None. FINDINGS: Study is mildly degraded by motion artifact despite repeated imaging attempts. CT HEAD  FINDINGS Brain: Oval hyperdense intra-axial hemorrhage centered at the right basal ganglia encompassing 57 x 30 x 33 millimeters (AP by transverse by CC) for an estimated blood volume of 28 milliliters. Surrounding edema. Mass effect with leftward midline shift of 8-9 millimeters. Effaced right lateral ventricle, but no intraventricular hemorrhage. No ventriculomegaly. No other acute intracranial hemorrhage identified. The basilar cisterns are normal. Chronic appearing encephalomalacia in the  right occipital pole. Confluent bilateral cerebral white matter hypodensity. Vascular: Degraded by motion, No suspicious intracranial vascular hyperdensity. Skull: Bone detail limited by motion, no acute osseous abnormality identified. Sinuses/Orbits: Clear. Other: Large generalized left side scalp and face superficial soft tissue hematoma measuring up to 11 millimeters in thickness. The forehead is affected. The findings continue along the lateral and anterior left orbit. Grossly negative orbits soft tissues, degraded by motion. The right scalp appears relatively spared. CT CERVICAL SPINE FINDINGS Alignment: Straightening and mild reversal of cervical lordosis. Cervicothoracic junction alignment is within normal limits. Bilateral posterior element alignment is within normal limits. Skull base and vertebrae: Lower cervical spine bone detail is decreased by large body habitus. Visualized skull base is intact. No atlanto-occipital dissociation. No cervical spine fracture identified. Soft tissues and spinal canal: There is a retropharyngeal course of both carotid arteries in the neck. Grossly negative noncontrast deep paraspinal soft tissues. Disc levels: There is broad-based right foraminal disc osteophyte degeneration at C3-C4 with severe right C4 foraminal stenosis. No other significant cervical spine degeneration is evident. Upper chest: The visible T1 level appears intact. IMPRESSION: 1. Acute right basal ganglia hemorrhage  with estimated blood volume of 28 mL. Underlying chronic cerebral ischemia, including previous right PCA infarct. 2. Surrounding edema with regional mass effect including leftward midline shift up to 9 mm. Basilar cisterns are patent. 3. Subtotal effacement of the right lateral ventricle, but no intraventricular extension of blood and no ventriculomegaly. 4. Large left scalp hematoma. No underlying skull fracture identified. 5.  No acute fracture in the cervical spine. Electronically Signed: By: Odessa FlemingH  Hall M.D. On: 02/16/2018 18:23   Ct Cervical Spine Wo Contrast  Addendum Date: 02/16/2018   ADDENDUM REPORT: 02/16/2018 18:43 ADDENDUM: Critical Value/emergent results were called by telephone at the time of interpretation on 02/16/2018 at 1824 hours. to Dr. Chaney MallingAVID YAO , who verbally acknowledged these results. Electronically Signed   By: Odessa FlemingH  Hall M.D.   On: 02/16/2018 18:43   Result Date: 02/16/2018 CLINICAL DATA:  52 year old male found down. EXAM: CT HEAD WITHOUT CONTRAST CT CERVICAL SPINE WITHOUT CONTRAST TECHNIQUE: Multidetector CT imaging of the head and cervical spine was performed following the standard protocol without intravenous contrast. Multiplanar CT image reconstructions of the cervical spine were also generated. COMPARISON:  None. FINDINGS: Study is mildly degraded by motion artifact despite repeated imaging attempts. CT HEAD FINDINGS Brain: Oval hyperdense intra-axial hemorrhage centered at the right basal ganglia encompassing 57 x 30 x 33 millimeters (AP by transverse by CC) for an estimated blood volume of 28 milliliters. Surrounding edema. Mass effect with leftward midline shift of 8-9 millimeters. Effaced right lateral ventricle, but no intraventricular hemorrhage. No ventriculomegaly. No other acute intracranial hemorrhage identified. The basilar cisterns are normal. Chronic appearing encephalomalacia in the right occipital pole. Confluent bilateral cerebral white matter hypodensity. Vascular:  Degraded by motion, No suspicious intracranial vascular hyperdensity. Skull: Bone detail limited by motion, no acute osseous abnormality identified. Sinuses/Orbits: Clear. Other: Large generalized left side scalp and face superficial soft tissue hematoma measuring up to 11 millimeters in thickness. The forehead is affected. The findings continue along the lateral and anterior left orbit. Grossly negative orbits soft tissues, degraded by motion. The right scalp appears relatively spared. CT CERVICAL SPINE FINDINGS Alignment: Straightening and mild reversal of cervical lordosis. Cervicothoracic junction alignment is within normal limits. Bilateral posterior element alignment is within normal limits. Skull base and vertebrae: Lower cervical spine bone detail is decreased by large body habitus. Visualized skull  base is intact. No atlanto-occipital dissociation. No cervical spine fracture identified. Soft tissues and spinal canal: There is a retropharyngeal course of both carotid arteries in the neck. Grossly negative noncontrast deep paraspinal soft tissues. Disc levels: There is broad-based right foraminal disc osteophyte degeneration at C3-C4 with severe right C4 foraminal stenosis. No other significant cervical spine degeneration is evident. Upper chest: The visible T1 level appears intact. IMPRESSION: 1. Acute right basal ganglia hemorrhage with estimated blood volume of 28 mL. Underlying chronic cerebral ischemia, including previous right PCA infarct. 2. Surrounding edema with regional mass effect including leftward midline shift up to 9 mm. Basilar cisterns are patent. 3. Subtotal effacement of the right lateral ventricle, but no intraventricular extension of blood and no ventriculomegaly. 4. Large left scalp hematoma. No underlying skull fracture identified. 5.  No acute fracture in the cervical spine. Electronically Signed: By: Odessa Fleming M.D. On: 02/16/2018 18:23    Procedures Procedures (including critical  care time)  CRITICAL CARE Performed by: [place] N Woodruff Skirvin   Total critical care time: 60 minutes  Critical care time was exclusive of separately billable procedures and treating other patients.  Critical care was necessary to treat or prevent imminent or life-threatening deterioration.  Critical care was time spent personally by me on the following activities: development of treatment plan with patient and/or surrogate as well as nursing, discussions with consultants, evaluation of patient's response to treatment, examination of patient, obtaining history from patient or surrogate, ordering and performing treatments and interventions, ordering and review of laboratory studies, ordering and review of radiographic studies, pulse oximetry and re-evaluation of patient's condition.   Medications Ordered in ED Medications  clevidipine (CLEVIPREX) infusion 0.5 mg/mL (28 mg/hr Intravenous Rate/Dose Change 02/16/18 2116)  labetalol (NORMODYNE,TRANDATE) injection 20 mg (not administered)  labetalol (NORMODYNE,TRANDATE) 5 MG/ML injection (not administered)  labetalol (NORMODYNE,TRANDATE) injection 10-20 mg (not administered)  labetalol (NORMODYNE,TRANDATE) 500 mg in dextrose 5 % 125 mL (4 mg/mL) infusion (not administered)  sodium chloride 0.9 % bolus 1,000 mL (0 mLs Intravenous Stopped 02/16/18 1940)  hydrALAZINE (APRESOLINE) injection 10 mg (10 mg Intravenous Given 02/16/18 1721)  sodium chloride 0.9 % bolus 1,000 mL (0 mLs Intravenous Stopped 02/16/18 2046)  nicardipine (CARDENE) 20mg  in 0.86% saline IV infusion (0.1 mg/ml) (0 mg/hr Intravenous Stopped 02/16/18 1925)  dexamethasone (DECADRON) injection 10 mg (10 mg Intravenous Given 02/16/18 1821)  ondansetron (ZOFRAN) injection 4 mg (4 mg Intravenous Given 02/16/18 1821)  labetalol (NORMODYNE,TRANDATE) injection 20 mg (20 mg Intravenous Given 02/16/18 2004)  labetalol (NORMODYNE,TRANDATE) injection 20 mg (20 mg Intravenous Given 02/16/18 2050)      Initial Impression / Assessment and Plan / ED Course  I have reviewed the triage vital signs and the nursing notes.  Pertinent labs & imaging results that were available during my care of the patient were reviewed by me and considered in my medical decision making (see chart for details).  Clinical Course as of Feb 16 2209  Sun Feb 16, 2018  1800 Pt re-evaluated after imaging, was noted to have an episode of emesis with concern for aspiration. Pt without inc work of breathing since initial presentation. Pt tolerating secretions, alert  [JR]  1910 Pt re-evaluated, remains alert. Nausea improved. Complaining of sore throat, no other new complaints. max dose cardene infusion, will add cleviprex.  [JR]  1935 Patient discussed with Dr. Laurence Slate with neurology.  He recommends titrate the Cleviprex every 5 minutes as needed to decrease the blood pressure appropriately.  We will  add 20 of labetalol if blood pressure not responding appropriately.  Will transfer to [facility] to neuro ICU.  [JR]  2000 Pt re-evaluated prior to transport, BP was improving however began increasing once again. Cleviprex at 8mg  currently. Will administer 20mg  labetolol prior to transport. No change in neuro exam.  [JR]    Clinical Course User Index [JR] Luciana Cammarata, [place] N, PA-C    Patient brought in by EMS, found down since 6 PM last night.  On exam, patient with left hemiparesis, left facial droop and slurred speech.  Pt is significantly hypertensive on arrival, 245/132.  Concern for LVO, patient last known normal about 24 hours ago.  Unable to perform CTA and perfusion study secondary to patient's creatinine of 3.9.  CT head positive for acute right basal ganglia hemorrhage, with midline shift about 9 mm. Bleed likely 2/t hypertension. Pt evaluated by teleneurologist; note and recommendations reviewed.  Blood pressure management including hydralazine, Cardene infusion.  Decadron also administered.  Cardene maxed, began Cleviprex.  CK wnl, no leukocytosis, hgb appears at baseline. UDS neg.  Patient discussed with Dr. Laurence Slate with neurology, who recommends titrate Cleviprex every 5 minutes as needed to decrease blood pressure.  Recommends patient be transferred emergently to the neuro ICU at [facility]. Pt re-evaluated multiple times through ED stay. Prior to transfer pt without change in neuro exam or mental status. 20mg  labetalol administered, as pt's blood pressure initially improving however began increasing.   Patient discussed with and evaluated by Dr. Silverio Lay, who guided treatment throughout entire ED course.  Final Clinical Impressions(s) / ED Diagnoses   Final diagnoses:  Acute intracerebral hemorrhage (HCC)  Acute renal failure, unspecified acute renal failure type [facility])  Hypertensive emergency    ED Discharge Orders    None       Rogerio Boutelle, [place] N, PA-C 02/16/18 2210    Charlynne Pander, MD 02/16/18 2330

## 2018-02-16 NOTE — H&P (Addendum)
Chief Complaint: Left side weakness,unable to get up  History obtained from: Patient and Chart    HPI:                                                                                                                                       [name] [name] is an 52 y.o. male  AA RH with PMH of uncontrolled HTN, CKD, insulin-dependent T2DM presented to [facility] long ER .  Pt reports around 6PM last night, he was sitting in a desk chair and dropped his phone and slipped to the floor. His family tried to get him up but he refused syainghe wsa fine and would get up. He was still in the floor today and so the called EMS. Now he endorses mild headache and mild nausea. BP was >200 systolic on arrival. He was started on Cerdene gtt at [place] and long and later switched to Kleveprix drip - but still not under control on arrival [facility].     Date last known well: 3.16.19 Time last known well: 6pm tPA Given: no, ICH NIHSS: 17 Baseline MRS 0  Intracerebral Hemorrhage (ICH) Score  Glascow Coma Score 13-15 0  Age >/= no 0  ICH volume >/= 30ml  yes +1  IVH yes no 0  Infratentorial origin no 0 Total:  1   Past Medical History:  Diagnosis Date  . Cellulitis and abscess of leg 01/08/2014   RT LEG  . Diabetes mellitus   . Diverticulitis   . Hypertension     Past Surgical History:  Procedure Laterality Date  . COLON SURGERY    . I&D EXTREMITY Right 01/09/2014   Procedure: IRRIGATION AND DEBRIDEMENT EXTREMITY;  Surgeon: Senaida Lange, MD;  Location: [facility] OR;  Service: Orthopedics;  Laterality: Right;  . 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 Hypertension Mother   . Diabetes Mellitus II Father    Social History:  reports that  has never smoked. he has never used smokeless tobacco. He reports that he  does not drink alcohol or use drugs.  Allergies:  Allergies  Allergen Reactions  . Minocycline Anaphylaxis    SOB, itching, hives.  . Shellfish Allergy Nausea And Vomiting    Medications:  I reviewed home medications> Not on ASA  ROS:                                                                                                                                     14 systems reviewed and negative except above    Examination:                                                                                                      General: Appears well-developed and well-nourished.  Psych: Affect appropriate to situation Eyes: No scleral injection HENT: No OP obstrucion Head: Normocephalic.  Cardiovascular: Normal rate and regular rhythm.  Respiratory: Effort normal and breath sounds normal to anterior ascultation GI: Soft.  No distension. There is no tenderness.  Skin: WDI   Neurological Examination Mental Status: Alert, oriented, thought content appropriate.  Speech fluent without evidence of aphasia. Moderate to severe dysarthria. Able to follow 3 step commands without difficulty. Cranial Nerves: II: Visual fields: Left HH III,IV, VI: No forced gaze deviation, able to cross midline  V,VII: smile symmetric, facial light touch sensation normal bilaterally VIII: hearing normal bilaterally IX,X: uvula rises symmetrically XI: bilateral shoulder shrug XII: midline tongue extension Motor: Right : Upper extremity   5/5    Left:     Upper extremity   0/5  Lower extremity   5/5     Lower extremity  0/5 Tone and bulk:normal tone throughout; no atrophy noted Sensory: reduced to light touch on left side  Deep Tendon Reflexes: 2+ and symmetric throughout Plantars: Right: downgoing   Left: downgoing Cerebellar: normal finger-to-nose, normal rapid alternating movements  on right side  Gait: normal gait and station     Lab Results: Basic Metabolic Panel: Recent Labs  Lab 02/16/18 1714 02/16/18 1721  NA 141 143  K 4.9 5.0  CL 111 111  CO2 21*  --   GLUCOSE 131* 129*  BUN 38* 36*  CREATININE 3.78* 3.90*  CALCIUM 9.1  --     CBC: Recent Labs  Lab 02/16/18 1714 02/16/18 1721  WBC 8.6  --   NEUTROABS 6.9  --   HGB 10.5* 11.6*  HCT 33.2* 34.0*  MCV 84.1  --   PLT 331  --     Coagulation Studies: No results for input(s): LABPROT, INR in the last 72 hours.  Imaging: Dg Chest 1 View  Result Date: 02/16/2018 CLINICAL DATA:  52 year old male found down. Suspected fall. Basal ganglia cerebral hemorrhage. EXAM: CHEST  1  VIEW COMPARISON:  Chest radiographs 10/22/2017. FINDINGS: Portable AP semi upright view at 1801 hours. Lower lung volumes. Confluent retrocardiac opacity obscuring the left hemidiaphragm. Stable mild cardiomegaly. Other mediastinal contours are within normal limits. Visualized tracheal air column is within normal limits. The right lung appears clear allowing for portable technique. IMPRESSION: 1. Confluent left lower lobe opacity compatible with collapse or consolidation. 2. Right lung ventilation is within normal limits. 3. Chronic cardiomegaly. Electronically Signed   By: Odessa Fleming M.D.   On: 02/16/2018 19:14   Dg Pelvis 1-2 Views  Result Date: 02/16/2018 CLINICAL DATA:  52 year old male found down. Suspected fall. Basal ganglia cerebral hemorrhage. EXAM: PELVIS - 1-2 VIEW COMPARISON:  CT Abdomen and Pelvis 03/28/2017. FINDINGS: Femoral heads are normally located. Hip joint spaces remain normal. The proximal femurs appear intact. The pelvis appears stable and intact. Bone mineralization is within normal limits. Negative visible bowel gas pattern. IMPRESSION: No acute fracture or dislocation identified about the pelvis. Electronically Signed   By: Odessa Fleming M.D.   On: 02/16/2018 19:15   Ct Head Wo Contrast  Addendum Date: 02/16/2018    ADDENDUM REPORT: 02/16/2018 18:43 ADDENDUM: Critical Value/emergent results were called by telephone at the time of interpretation on 02/16/2018 at 1824 hours. to Dr. Chaney Malling , who verbally acknowledged these results. Electronically Signed   By: Odessa Fleming M.D.   On: 02/16/2018 18:43   Result Date: 02/16/2018 CLINICAL DATA:  52 year old male found down. EXAM: CT HEAD WITHOUT CONTRAST CT CERVICAL SPINE WITHOUT CONTRAST TECHNIQUE: Multidetector CT imaging of the head and cervical spine was performed following the standard protocol without intravenous contrast. Multiplanar CT image reconstructions of the cervical spine were also generated. COMPARISON:  None. FINDINGS: Study is mildly degraded by motion artifact despite repeated imaging attempts. CT HEAD FINDINGS Brain: Oval hyperdense intra-axial hemorrhage centered at the right basal ganglia encompassing 57 x 30 x 33 millimeters (AP by transverse by CC) for an estimated blood volume of 28 milliliters. Surrounding edema. Mass effect with leftward midline shift of 8-9 millimeters. Effaced right lateral ventricle, but no intraventricular hemorrhage. No ventriculomegaly. No other acute intracranial hemorrhage identified. The basilar cisterns are normal. Chronic appearing encephalomalacia in the right occipital pole. Confluent bilateral cerebral white matter hypodensity. Vascular: Degraded by motion, No suspicious intracranial vascular hyperdensity. Skull: Bone detail limited by motion, no acute osseous abnormality identified. Sinuses/Orbits: Clear. Other: Large generalized left side scalp and face superficial soft tissue hematoma measuring up to 11 millimeters in thickness. The forehead is affected. The findings continue along the lateral and anterior left orbit. Grossly negative orbits soft tissues, degraded by motion. The right scalp appears relatively spared. CT CERVICAL SPINE FINDINGS Alignment: Straightening and mild reversal of cervical lordosis. Cervicothoracic  junction alignment is within normal limits. Bilateral posterior element alignment is within normal limits. Skull base and vertebrae: Lower cervical spine bone detail is decreased by large body habitus. Visualized skull base is intact. No atlanto-occipital dissociation. No cervical spine fracture identified. Soft tissues and spinal canal: There is a retropharyngeal course of both carotid arteries in the neck. Grossly negative noncontrast deep paraspinal soft tissues. Disc levels: There is broad-based right foraminal disc osteophyte degeneration at C3-C4 with severe right C4 foraminal stenosis. No other significant cervical spine degeneration is evident. Upper chest: The visible T1 level appears intact. IMPRESSION: 1. Acute right basal ganglia hemorrhage with estimated blood volume of 28 mL. Underlying chronic cerebral ischemia, including previous right PCA infarct. 2. Surrounding edema with regional mass effect  including leftward midline shift up to 9 mm. Basilar cisterns are patent. 3. Subtotal effacement of the right lateral ventricle, but no intraventricular extension of blood and no ventriculomegaly. 4. Large left scalp hematoma. No underlying skull fracture identified. 5.  No acute fracture in the cervical spine. Electronically Signed: By: Odessa Fleming M.D. On: 02/16/2018 18:23   Ct Cervical Spine Wo Contrast  Addendum Date: 02/16/2018   ADDENDUM REPORT: 02/16/2018 18:43 ADDENDUM: Critical Value/emergent results were called by telephone at the time of interpretation on 02/16/2018 at 1824 hours. to Dr. Chaney Malling , who verbally acknowledged these results. Electronically Signed   By: Odessa Fleming M.D.   On: 02/16/2018 18:43   Result Date: 02/16/2018 CLINICAL DATA:  52 year old male found down. EXAM: CT HEAD WITHOUT CONTRAST CT CERVICAL SPINE WITHOUT CONTRAST TECHNIQUE: Multidetector CT imaging of the head and cervical spine was performed following the standard protocol without intravenous contrast. Multiplanar CT image  reconstructions of the cervical spine were also generated. COMPARISON:  None. FINDINGS: Study is mildly degraded by motion artifact despite repeated imaging attempts. CT HEAD FINDINGS Brain: Oval hyperdense intra-axial hemorrhage centered at the right basal ganglia encompassing 57 x 30 x 33 millimeters (AP by transverse by CC) for an estimated blood volume of 28 milliliters. Surrounding edema. Mass effect with leftward midline shift of 8-9 millimeters. Effaced right lateral ventricle, but no intraventricular hemorrhage. No ventriculomegaly. No other acute intracranial hemorrhage identified. The basilar cisterns are normal. Chronic appearing encephalomalacia in the right occipital pole. Confluent bilateral cerebral white matter hypodensity. Vascular: Degraded by motion, No suspicious intracranial vascular hyperdensity. Skull: Bone detail limited by motion, no acute osseous abnormality identified. Sinuses/Orbits: Clear. Other: Large generalized left side scalp and face superficial soft tissue hematoma measuring up to 11 millimeters in thickness. The forehead is affected. The findings continue along the lateral and anterior left orbit. Grossly negative orbits soft tissues, degraded by motion. The right scalp appears relatively spared. CT CERVICAL SPINE FINDINGS Alignment: Straightening and mild reversal of cervical lordosis. Cervicothoracic junction alignment is within normal limits. Bilateral posterior element alignment is within normal limits. Skull base and vertebrae: Lower cervical spine bone detail is decreased by large body habitus. Visualized skull base is intact. No atlanto-occipital dissociation. No cervical spine fracture identified. Soft tissues and spinal canal: There is a retropharyngeal course of both carotid arteries in the neck. Grossly negative noncontrast deep paraspinal soft tissues. Disc levels: There is broad-based right foraminal disc osteophyte degeneration at C3-C4 with severe right C4 foraminal  stenosis. No other significant cervical spine degeneration is evident. Upper chest: The visible T1 level appears intact. IMPRESSION: 1. Acute right basal ganglia hemorrhage with estimated blood volume of 28 mL. Underlying chronic cerebral ischemia, including previous right PCA infarct. 2. Surrounding edema with regional mass effect including leftward midline shift up to 9 mm. Basilar cisterns are patent. 3. Subtotal effacement of the right lateral ventricle, but no intraventricular extension of blood and no ventriculomegaly. 4. Large left scalp hematoma. No underlying skull fracture identified. 5.  No acute fracture in the cervical spine. Electronically Signed: By: Odessa Fleming M.D. On: 02/16/2018 18:23   Dg Chest Port 1 View  Result Date: 02/16/2018 CLINICAL DATA:  Acute respiratory failure with hypoxia. EXAM: PORTABLE CHEST 1 VIEW COMPARISON:  Radiograph earlier this day. FINDINGS: Unchanged cardiomegaly and mediastinal contours. Unchanged retrocardiac opacity. Mild bronchial thickening. No new focal airspace disease. No pneumothorax. IMPRESSION: 1. Unchanged retrocardiac opacity which may be combination of pleural fluid and atelectasis or  airspace disease. 2. Unchanged cardiomegaly. 3. Mild central bronchial thickening. Electronically Signed   By: Rubye Oaks M.D.   On: 02/16/2018 22:00     ASSESSMENT AND PLAN   Right Basal Ganglia Hemorrhage without IVH Cerebral Edema with mass effect  Titrated Cleveprix drip, PRN labetolol Goal BP <160/90 Repeat CT head ordered for midnight - if worsening midline shift, will start hypertonic saline No Antiplatelet place tube feeds to start PO medications  Cerebral edema Will repeat Ct head, if worse midline shift will start hypertonic saline Avoid Hyponatremia  HTN emergency BP goal <160 systolic Arterial line obtained Cleveprix gtt, Labetolol PRN Resume home BP meds   Acute renal failure Likely prerenal due to HTN crisis Avoid nephrotoxic  agents Continue to monitor Aprreciate CCM consultation   DVT PPX; SCD Diet: NPO     This patient is neurologically critically ill due to ICH.  He is at risk for significant risk of neurological worsening from cerebral edema,  death from brain herniation, heart failure, infection, respiratory failure and seizure and renal failure.  This patient's care requires constant monitoring of vital signs, hemodynamics, respiratory and cardiac monitoring, review of multiple databases, neurological assessment, discussion with family, other specialists and medical decision making of high complexity.  I spent 70  minutes of neurocritical time in the care of this patient.    Elizzie Westergard [facility] Pager Number [phone]

## 2018-02-17 ENCOUNTER — Inpatient Hospital Stay (HOSPITAL_COMMUNITY): Payer: 59

## 2018-02-17 DIAGNOSIS — I361 Nonrheumatic tricuspid (valve) insufficiency: Secondary | ICD-10-CM

## 2018-02-17 LAB — ECHOCARDIOGRAM COMPLETE
Height: 74 in
Weight: 6201.1 oz

## 2018-02-17 LAB — BASIC METABOLIC PANEL
Anion gap: 8 (ref 5–15)
BUN: 43 mg/dL — AB (ref 6–20)
CHLORIDE: 112 mmol/L — AB (ref 101–111)
CO2: 18 mmol/L — ABNORMAL LOW (ref 22–32)
CREATININE: 4.09 mg/dL — AB (ref 0.61–1.24)
Calcium: 8.5 mg/dL — ABNORMAL LOW (ref 8.9–10.3)
GFR calc Af Amer: 18 mL/min — ABNORMAL LOW (ref >60)
GFR calc non Af Amer: 16 mL/min — ABNORMAL LOW (ref >60)
Glucose, Bld: 186 mg/dL — ABNORMAL HIGH (ref 65–99)
POTASSIUM: 5 mmol/L (ref 3.5–5.1)
SODIUM: 138 mmol/L (ref 135–145)

## 2018-02-17 LAB — GLUCOSE, CAPILLARY
GLUCOSE-CAPILLARY: 167 mg/dL — AB (ref 65–99)
GLUCOSE-CAPILLARY: 153 mg/dL — AB (ref 65–99)
GLUCOSE-CAPILLARY: 138 mg/dL — AB (ref 65–99)
GLUCOSE-CAPILLARY: 129 mg/dL — AB (ref 65–99)
GLUCOSE-CAPILLARY: 128 mg/dL — AB (ref 65–99)
Glucose-Capillary: 115 mg/dL — ABNORMAL HIGH (ref 65–99)
Glucose-Capillary: 169 mg/dL — ABNORMAL HIGH (ref 65–99)

## 2018-02-17 LAB — CBC
HEMATOCRIT: 29.4 % — AB (ref 39.0–52.0)
Hemoglobin: 9.2 g/dL — ABNORMAL LOW (ref 13.0–17.0)
MCH: 25.9 pg — AB (ref 26.0–34.0)
MCHC: 31.3 g/dL (ref 30.0–36.0)
MCV: 82.8 fL (ref 78.0–100.0)
PLATELETS: 275 10*3/uL (ref 150–400)
RBC: 3.55 MIL/uL — ABNORMAL LOW (ref 4.22–5.81)
RDW: 16 % — AB (ref 11.5–15.5)
WBC: 11.1 10*3/uL — AB (ref 4.0–10.5)

## 2018-02-17 LAB — PHOSPHORUS: PHOSPHORUS: 6 mg/dL — AB (ref 2.5–4.6)

## 2018-02-17 LAB — PROCALCITONIN: Procalcitonin: 0.22 ng/mL

## 2018-02-17 LAB — MAGNESIUM: Magnesium: 2.2 mg/dL (ref 1.7–2.4)

## 2018-02-17 LAB — SODIUM: SODIUM: 141 mmol/L (ref 135–145)

## 2018-02-17 MED ORDER — CHLORHEXIDINE GLUCONATE CLOTH 2 % EX PADS
6.0000 | MEDICATED_PAD | Freq: Every day | CUTANEOUS | Status: DC
  Administered 2018-02-17 – 2018-03-06 (×9): 6 via TOPICAL

## 2018-02-17 MED ORDER — RESOURCE THICKENUP CLEAR PO POWD
ORAL | Status: DC | PRN
  Filled 2018-02-17 (×2): qty 125

## 2018-02-17 MED ORDER — SODIUM BICARBONATE 650 MG PO TABS
650.0000 mg | ORAL_TABLET | Freq: Two times a day (BID) | ORAL | Status: DC
  Administered 2018-02-17 – 2018-02-20 (×6): 650 mg
  Filled 2018-02-17 (×6): qty 1

## 2018-02-17 MED ORDER — ORAL CARE MOUTH RINSE
15.0000 mL | Freq: Two times a day (BID) | OROMUCOSAL | Status: DC
  Administered 2018-02-17 – 2018-03-06 (×27): 15 mL via OROMUCOSAL

## 2018-02-17 MED ORDER — AMLODIPINE BESYLATE 10 MG PO TABS
10.0000 mg | ORAL_TABLET | Freq: Every day | ORAL | Status: DC
  Administered 2018-02-17 – 2018-03-06 (×18): 10 mg
  Filled 2018-02-17 (×18): qty 1

## 2018-02-17 MED ORDER — ORAL CARE MOUTH RINSE
15.0000 mL | Freq: Two times a day (BID) | OROMUCOSAL | Status: DC
  Administered 2018-02-17: 15 mL via OROMUCOSAL

## 2018-02-17 MED ORDER — SODIUM CHLORIDE 0.9 % IV SOLN: INTRAVENOUS | Status: DC

## 2018-02-17 MED ORDER — LABETALOL HCL 5 MG/ML IV SOLN
20.0000 mg | INTRAVENOUS | Status: DC | PRN
  Administered 2018-02-17: 20 mg via INTRAVENOUS
  Administered 2018-02-19 – 2018-02-21 (×4): 40 mg via INTRAVENOUS
  Administered 2018-02-22: 20 mg via INTRAVENOUS
  Administered 2018-02-22: 40 mg via INTRAVENOUS
  Administered 2018-02-22: 20 mg via INTRAVENOUS
  Administered 2018-02-23 (×3): 40 mg via INTRAVENOUS
  Administered 2018-02-24: 20 mg via INTRAVENOUS
  Administered 2018-02-24 (×2): 40 mg via INTRAVENOUS
  Administered 2018-02-24 – 2018-02-25 (×2): 20 mg via INTRAVENOUS
  Administered 2018-02-26 – 2018-02-27 (×4): 40 mg via INTRAVENOUS
  Administered 2018-03-02 – 2018-03-04 (×4): 20 mg via INTRAVENOUS
  Filled 2018-02-17 (×2): qty 8
  Filled 2018-02-17 (×3): qty 4
  Filled 2018-02-17: qty 8
  Filled 2018-02-17: qty 4
  Filled 2018-02-17: qty 8
  Filled 2018-02-17: qty 4
  Filled 2018-02-17: qty 8
  Filled 2018-02-17: qty 4
  Filled 2018-02-17: qty 8
  Filled 2018-02-17: qty 4
  Filled 2018-02-17: qty 8
  Filled 2018-02-17: qty 4
  Filled 2018-02-17 (×3): qty 8
  Filled 2018-02-17: qty 4
  Filled 2018-02-17: qty 8
  Filled 2018-02-17: qty 4
  Filled 2018-02-17 (×3): qty 8
  Filled 2018-02-17: qty 4

## 2018-02-17 MED ORDER — CHLORHEXIDINE GLUCONATE 0.12 % MT SOLN
15.0000 mL | Freq: Two times a day (BID) | OROMUCOSAL | Status: DC
  Administered 2018-02-17 – 2018-03-06 (×31): 15 mL via OROMUCOSAL
  Filled 2018-02-17 (×28): qty 15

## 2018-02-17 MED ORDER — AMLODIPINE 1 MG/ML ORAL SUSPENSION
10.0000 mg | Freq: Every day | ORAL | Status: DC
  Filled 2018-02-17: qty 10

## 2018-02-17 MED ORDER — SODIUM CHLORIDE 0.9 % IV BOLUS (SEPSIS): 250.0000 mL | Freq: Once | INTRAVENOUS | Status: DC

## 2018-02-17 MED ORDER — SODIUM CHLORIDE 3 % IV SOLN
INTRAVENOUS | Status: AC
  Administered 2018-02-17: 75 mL/h via INTRAVENOUS
  Administered 2018-02-18: 80 mL/h via INTRAVENOUS
  Filled 2018-02-17 (×7): qty 500

## 2018-02-17 NOTE — Progress Notes (Signed)
  Echocardiogram 2D Echocardiogram has been performed.  [name] [name], [name] [name] F 02/17/2018, 1:28 PM

## 2018-02-17 NOTE — Progress Notes (Signed)
Made Dr. Wallace CullensGray with CCM aware of pt's low urine output.  He ordered a 250mL bolus.  Will continue to monitor.

## 2018-02-17 NOTE — Evaluation (Signed)
Physical Therapy Evaluation Patient Details Name: [name] [name] MRN: [id number] DOB: [birth year] Today's Date: 02/17/2018   History of Present Illness  52 yr old male with sig history of CKD Stage Iv, HTN, DM, HFpEF, presents with an acute ICH located in the right basal ganglia with midline shift and surrounding edema  Clinical Impression  Orders received for PT evaluation. Patient demonstrates deficits in functional mobility as indicated below. Will benefit from continued skilled PT to address deficits and maximize function. Will see as indicated and progress as tolerated.  OF NOTE: Patient with BP within parameters during session, O2 saturations fluctuating between 90-96% on room air and with 2 liters [place]. At this time, patient requiring significant physical assist for all aspects of mobility. Given current deficits feel patient will need extensive therapies. Will recommend CIR consult at this time pending patient progress and ability to participate.     Follow Up Recommendations CIR;Supervision/Assistance - 24 hour(if progression with activity )    Equipment Recommendations  (TBD)    Recommendations for Other Services Rehab consult     Precautions / Restrictions Precautions Precautions: Fall Precaution Comments: left inattention, decreased sensation left side but pain response intact Restrictions Weight Bearing Restrictions: No      Mobility  Bed Mobility Overal bed mobility: Needs Assistance Bed Mobility: Supine to Sit;Sit to Supine     Supine to sit: Total assist;+2 for physical assistance;HOB elevated(helicopter technique with bed pad) Sit to supine: Total assist;+2 for physical assistance      Transfers                 General transfer comment: unable to perform at this time  Ambulation/Gait             General Gait Details: unable to perform  Stairs            Wheelchair Mobility    Modified Rankin (Stroke Patients Only) Modified Rankin  (Stroke Patients Only) Pre-Morbid Rankin Score: No symptoms Modified Rankin: Severe disability     Balance Overall balance assessment: Needs assistance Sitting-balance support: Feet supported;Single extremity supported   Sitting balance - Comments: pt varied from fair to zero with pushing with RUE                                     Pertinent Vitals/Pain Pain Assessment: No/denies pain    Home Living Family/patient expects to be discharged to:: Private residence Living Arrangements: Spouse/significant other Available Help at Discharge: Family;Available 24 hours/day Type of Home: House Home Access: Stairs to enter Entrance Stairs-Rails: None Entrance Stairs-Number of Steps: 1 Home Layout: Two level;Bed/bath upstairs Home Equipment: Emergency planning/management officerhower seat;Walker - 2 wheels      Prior Function Level of Independence: Independent               Hand Dominance   Dominant Hand: Right    Extremity/Trunk Assessment   Upper Extremity Assessment Upper Extremity Assessment: LUE deficits/detail RUE Deficits / Details: Flaccid, only response to pain    Lower Extremity Assessment Lower Extremity Assessment: LLE deficits/detail LLE Deficits / Details: some proximal trace activation, flaccid distal. No withdrawl to pain distally LLE Sensation: decreased light touch LLE Coordination: (absent)    Cervical / Trunk Assessment Cervical / Trunk Assessment: (increased body habitus)  Communication   Communication: No difficulties  Cognition Arousal/Alertness: Awake/alert Behavior During Therapy: Flat affect Overall Cognitive Status: Impaired/Different from baseline Area of Impairment:  Following commands;Safety/judgement;Awareness;Problem solving                       Following Commands: Follows one step commands consistently Safety/Judgement: Decreased awareness of safety;Decreased awareness of deficits Awareness: Intellectual Problem Solving: Requires verbal  cues;Requires tactile cues;Difficulty sequencing General Comments: slow to answer questions, decreased awareness of lines on his right side moving RUE to help with balance and trying to scratch his RUE up against his body      General Comments      Exercises     Assessment/Plan    PT Assessment Patient needs continued PT services  PT Problem List Decreased strength;Decreased activity tolerance;Decreased balance;Decreased mobility;Decreased coordination;Decreased cognition;Decreased safety awareness;Impaired sensation;Obesity       PT Treatment Interventions DME instruction;Gait training;Functional mobility training;Therapeutic activities;Therapeutic exercise;Balance training;Neuromuscular re-education;Cognitive remediation;Patient/family education    PT Goals (Current goals can be found in the Care Plan section)  Acute Rehab PT Goals Patient Stated Goal: did not state PT Goal Formulation: With patient Time For Goal Achievement: 03/03/18 Potential to Achieve Goals: Fair    Frequency Min 3X/week   Barriers to discharge        Co-evaluation PT/OT/SLP Co-Evaluation/Treatment: Yes Reason for Co-Treatment: Complexity of the patient's impairments (multi-system involvement);For patient/therapist safety PT goals addressed during session: Mobility/safety with mobility OT goals addressed during session: ADL's and self-care       AM-PAC PT "6 Clicks" Daily Activity  Outcome Measure Difficulty turning over in bed (including adjusting bedclothes, sheets and blankets)?: Unable Difficulty moving from lying on back to sitting on the side of the bed? : Unable Difficulty sitting down on and standing up from a chair with arms (e.g., wheelchair, bedside commode, etc,.)?: Unable Help needed moving to and from a bed to chair (including a wheelchair)?: Total Help needed walking in hospital room?: Total Help needed climbing 3-5 steps with a railing? : Total 6 Click Score: 6    End of  Session Equipment Utilized During Treatment: Oxygen Activity Tolerance: Patient limited by fatigue Patient left: in bed;with call bell/phone within reach;with SCD's reapplied Nurse Communication: Mobility status;Need for lift equipment PT Visit Diagnosis: Hemiplegia and hemiparesis Hemiplegia - Right/Left: Left Hemiplegia - dominant/non-dominant: Non-dominant Hemiplegia - caused by: Nontraumatic SAH    Time: 1610-9604 PT Time Calculation (min) (ACUTE ONLY): 32 min   Charges:   PT Evaluation $PT Eval Moderate Complexity: 1 Mod     PT G Codes:        Charlotte Crumb, PT DPT  Board Certified Neurologic Specialist [phone]   [name] [name] 02/17/2018, 4:09 PM

## 2018-02-17 NOTE — Progress Notes (Signed)
PULMONARY / CRITICAL CARE MEDICINE   Name: [name] [name] MRN: [id number] DOB: [birth year]    ADMISSION DATE:  02/16/2018  CHIEF COMPLAINT:  ICH  HISTORY OF PRESENT ILLNESS:        52 year old male with CKD stage IV, Uncontrolled HTN, DM, Diastolic Heart Failure (EF 50-55)       Presents to ED on 3/17 after being found on floor laying on the left side at 6 pm 3/16. On floor for estimated 18 hours. Presented to ED with headache, nausea, and left sided weakness with slurred speech. BP on arrival 245/132. CT head with acute right basal ganglia hemorrhage with midline shift about 9 mm. Transferred to [facility] for further care. Arrived on Cleviprex with systolic in the 200s. PCCM asked to consult.       Repeat CT scan this morning shows no progression of the intracranial hemorrhage.   He is on Cleviprex for blood pressure control.  He is not verbally interactive with me this morning but does nod appropriately and quickly to questions.   PAST MEDICAL HISTORY :  He  has a past medical history of Cellulitis and abscess of leg (01/08/2014), Diabetes mellitus, Diverticulitis, and Hypertension.  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  Allergies  Allergen Reactions  . Minocycline Anaphylaxis    SOB, itching, hives.  . Shellfish Allergy Nausea And Vomiting    No current facility-administered medications on file prior to encounter.    Current Outpatient Medications on File Prior to Encounter  Medication Sig  . amLODipine (NORVASC) 10 MG tablet Take 1 tablet (10 mg total) by mouth daily.  . furosemide (LASIX) 20 MG tablet Take 40 mg by mouth daily.   . Insulin Detemir (LEVEMIR) 100 UNIT/ML Pen Inject 22 Units into the skin daily at 10 pm.  . labetalol (NORMODYNE) 200 MG tablet Take 200 mg by mouth 2 (two) times daily.   Marland Kitchen LORazepam (ATIVAN) 0.5 MG tablet Take 0.5  mg by mouth 2 (two) times daily as needed for anxiety.  . tamsulosin (FLOMAX) 0.4 MG CAPS capsule Take one capsule daily until stone passes. (Patient not taking: Reported on 02/16/2018)    FAMILY HISTORY:  His indicated that his mother is alive. He indicated that his father is alive. He indicated that his sister is alive.   SOCIAL HISTORY: He  reports that  has never smoked. he has never used smokeless tobacco. He reports that he does not drink alcohol or use drugs.  REVIEW OF SYSTEMS:   Not obtainable SUBJECTIVE:  As above  VITAL SIGNS: BP 137/63   Pulse (!) 30   Temp 98.2 F (36.8 C) (Axillary)   Resp (!) 21   Ht 6\' 2"  (1.88 m)   Wt (!) 387 lb 9.1 oz (175.8 kg)   SpO2 96%   BMI 49.76 kg/m   HEMODYNAMICS:    VENTILATOR SETTINGS:    INTAKE / OUTPUT: I/O last 3 completed shifts: In: 500.8 [I.V.:500.8] Out: 430 [Urine:430]  PHYSICAL EXAMINATION: General: Resting comfortably in bed.  Not verbal for me but alert and nodding to questions. Neuro: Pupils are equal, EOMs appear to be full, and the face is symmetric.  He is purposeful with the right upper extremity and moves the right lower extremity on request.  He also has some movement in the left lower extremity on request but is unable to move the left upper  extremity. Cardiovascular: S1 and S2 are regular without murmur rub or gallop Lungs: Respirations are unlabored, there is symmetric air movement, no wheezes Abdomen: The abdomen is grossly obese without any obvious organomegaly masses tenderness guarding or rebound Musculoskeletal: Right toes are surgically absent.  There is no dependent edema.   LABS:  BMET Recent Labs  Lab 02/16/18 1714 02/16/18 1721 02/16/18 2220 02/17/18 0459  NA 141 143 138 138  K 4.9 5.0 4.6 5.0  CL 111 111 113* 112*  CO2 21*  --  17* 18*  BUN 38* 36* 39* 43*  CREATININE 3.78* 3.90* 3.64* 4.09*  GLUCOSE 131* 129* 161* 186*    Electrolytes Recent Labs  Lab 02/16/18 1714  02/16/18 2220 02/17/18 0459  CALCIUM 9.1 8.4* 8.5*  MG  --   --  2.2  PHOS  --   --  6.0*    CBC Recent Labs  Lab 02/16/18 1714 02/16/18 1721 02/17/18 0459  WBC 8.6  --  11.1*  HGB 10.5* 11.6* 9.2*  HCT 33.2* 34.0* 29.4*  PLT 331  --  275    Coag's No results for input(s): APTT, INR in the last 168 hours.  Sepsis Markers Recent Labs  Lab 02/16/18 2220 02/17/18 0459  PROCALCITON 0.15 0.22    ABG No results for input(s): PHART, PCO2ART, PO2ART in the last 168 hours.  Liver Enzymes Recent Labs  Lab 02/16/18 1714  AST 17  ALT 20  ALKPHOS 93  BILITOT 1.0  ALBUMIN 3.0*    Cardiac Enzymes No results for input(s): TROPONINI, PROBNP in the last 168 hours.  Glucose Recent Labs  Lab 02/16/18 2051 02/17/18 0022 02/17/18 0327 02/17/18 0747  GLUCAP 139* 169* 167* 153*    Imaging Dg Chest 1 View  Result Date: 02/16/2018 CLINICAL DATA:  52 year old male found down. Suspected fall. Basal ganglia cerebral hemorrhage. EXAM: CHEST  1 VIEW COMPARISON:  Chest radiographs 10/22/2017. FINDINGS: Portable AP semi upright view at 1801 hours. Lower lung volumes. Confluent retrocardiac opacity obscuring the left hemidiaphragm. Stable mild cardiomegaly. Other mediastinal contours are within normal limits. Visualized tracheal air column is within normal limits. The right lung appears clear allowing for portable technique. IMPRESSION: 1. Confluent left lower lobe opacity compatible with collapse or consolidation. 2. Right lung ventilation is within normal limits. 3. Chronic cardiomegaly. Electronically Signed   By: Odessa Fleming M.D.   On: 02/16/2018 19:14   Dg Pelvis 1-2 Views  Result Date: 02/16/2018 CLINICAL DATA:  52 year old male found down. Suspected fall. Basal ganglia cerebral hemorrhage. EXAM: PELVIS - 1-2 VIEW COMPARISON:  CT Abdomen and Pelvis 03/28/2017. FINDINGS: Femoral heads are normally located. Hip joint spaces remain normal. The proximal femurs appear intact. The pelvis  appears stable and intact. Bone mineralization is within normal limits. Negative visible bowel gas pattern. IMPRESSION: No acute fracture or dislocation identified about the pelvis. Electronically Signed   By: Odessa Fleming M.D.   On: 02/16/2018 19:15   Ct Head Wo Contrast  Result Date: 02/17/2018 CLINICAL DATA:  Follow-up examination for intracranial hemorrhage. EXAM: CT HEAD WITHOUT CONTRAST TECHNIQUE: Contiguous axial images were obtained from the base of the skull through the vertex without intravenous contrast. COMPARISON:  Prior CT from 02/16/2018. FINDINGS: Brain: Intraparenchymal hematoma centered at the right basal ganglia again seen overall not significantly changed in size measuring 28 mL in size (previously 28 mL). Surrounding low-density vasogenic edema with regional mass effect is relatively similar. Right lateral ventricle is largely effaced. Associated right-to-left shift measures up to  7 mm, little interval change. No hydrocephalus or ventricular trapping. No discernible intraventricular extension of hemorrhage. No new intracranial hemorrhage. No other acute large vessel territory infarct. Underlying atrophy with chronic small vessel ischemic disease and remote right PCA territory infarct noted. No extra-axial fluid collection. Vascular: No hyperdense vessel. Scattered vascular calcifications noted within the carotid siphons. Skull: Evolving left frontotemporal scalp contusion with overlying soft tissue edema. Calvarium intact. Sinuses/Orbits: Globes and orbital soft tissues within normal limits. Mild layering opacity within the left fronto ethmoidal sinus. Paranasal sinuses are otherwise clear. Nasogastric tube in place. No mastoid effusion. Other: None. IMPRESSION: 1. No significant interval change in size and appearance of right basal ganglia intraparenchymal hemorrhage, estimated volume 28 mL. Regional mass effect with localized 7 mm right-to-left shift relatively stable. No hydrocephalus or  ventricular trapping. 2. Evolving left frontotemporal scalp contusion. 3. No other acute intracranial abnormality. 4. Underlying atrophy with chronic small vessel ischemic disease and remote right PCA territory infarct. Electronically Signed   By: Rise Mu M.D.   On: 02/17/2018 01:37   Ct Head Wo Contrast  Addendum Date: 02/16/2018   ADDENDUM REPORT: 02/16/2018 18:43 ADDENDUM: Critical Value/emergent results were called by telephone at the time of interpretation on 02/16/2018 at 1824 hours. to Dr. Chaney Malling , who verbally acknowledged these results. Electronically Signed   By: Odessa Fleming M.D.   On: 02/16/2018 18:43   Result Date: 02/16/2018 CLINICAL DATA:  52 year old male found down. EXAM: CT HEAD WITHOUT CONTRAST CT CERVICAL SPINE WITHOUT CONTRAST TECHNIQUE: Multidetector CT imaging of the head and cervical spine was performed following the standard protocol without intravenous contrast. Multiplanar CT image reconstructions of the cervical spine were also generated. COMPARISON:  None. FINDINGS: Study is mildly degraded by motion artifact despite repeated imaging attempts. CT HEAD FINDINGS Brain: Oval hyperdense intra-axial hemorrhage centered at the right basal ganglia encompassing 57 x 30 x 33 millimeters (AP by transverse by CC) for an estimated blood volume of 28 milliliters. Surrounding edema. Mass effect with leftward midline shift of 8-9 millimeters. Effaced right lateral ventricle, but no intraventricular hemorrhage. No ventriculomegaly. No other acute intracranial hemorrhage identified. The basilar cisterns are normal. Chronic appearing encephalomalacia in the right occipital pole. Confluent bilateral cerebral white matter hypodensity. Vascular: Degraded by motion, No suspicious intracranial vascular hyperdensity. Skull: Bone detail limited by motion, no acute osseous abnormality identified. Sinuses/Orbits: Clear. Other: Large generalized left side scalp and face superficial soft tissue  hematoma measuring up to 11 millimeters in thickness. The forehead is affected. The findings continue along the lateral and anterior left orbit. Grossly negative orbits soft tissues, degraded by motion. The right scalp appears relatively spared. CT CERVICAL SPINE FINDINGS Alignment: Straightening and mild reversal of cervical lordosis. Cervicothoracic junction alignment is within normal limits. Bilateral posterior element alignment is within normal limits. Skull base and vertebrae: Lower cervical spine bone detail is decreased by large body habitus. Visualized skull base is intact. No atlanto-occipital dissociation. No cervical spine fracture identified. Soft tissues and spinal canal: There is a retropharyngeal course of both carotid arteries in the neck. Grossly negative noncontrast deep paraspinal soft tissues. Disc levels: There is broad-based right foraminal disc osteophyte degeneration at C3-C4 with severe right C4 foraminal stenosis. No other significant cervical spine degeneration is evident. Upper chest: The visible T1 level appears intact. IMPRESSION: 1. Acute right basal ganglia hemorrhage with estimated blood volume of 28 mL. Underlying chronic cerebral ischemia, including previous right PCA infarct. 2. Surrounding edema with regional mass effect including  leftward midline shift up to 9 mm. Basilar cisterns are patent. 3. Subtotal effacement of the right lateral ventricle, but no intraventricular extension of blood and no ventriculomegaly. 4. Large left scalp hematoma. No underlying skull fracture identified. 5.  No acute fracture in the cervical spine. Electronically Signed: By: Odessa Fleming M.D. On: 02/16/2018 18:23   Ct Cervical Spine Wo Contrast  Addendum Date: 02/16/2018   ADDENDUM REPORT: 02/16/2018 18:43 ADDENDUM: Critical Value/emergent results were called by telephone at the time of interpretation on 02/16/2018 at 1824 hours. to Dr. Chaney Malling , who verbally acknowledged these results.  Electronically Signed   By: Odessa Fleming M.D.   On: 02/16/2018 18:43   Result Date: 02/16/2018 CLINICAL DATA:  52 year old male found down. EXAM: CT HEAD WITHOUT CONTRAST CT CERVICAL SPINE WITHOUT CONTRAST TECHNIQUE: Multidetector CT imaging of the head and cervical spine was performed following the standard protocol without intravenous contrast. Multiplanar CT image reconstructions of the cervical spine were also generated. COMPARISON:  None. FINDINGS: Study is mildly degraded by motion artifact despite repeated imaging attempts. CT HEAD FINDINGS Brain: Oval hyperdense intra-axial hemorrhage centered at the right basal ganglia encompassing 57 x 30 x 33 millimeters (AP by transverse by CC) for an estimated blood volume of 28 milliliters. Surrounding edema. Mass effect with leftward midline shift of 8-9 millimeters. Effaced right lateral ventricle, but no intraventricular hemorrhage. No ventriculomegaly. No other acute intracranial hemorrhage identified. The basilar cisterns are normal. Chronic appearing encephalomalacia in the right occipital pole. Confluent bilateral cerebral white matter hypodensity. Vascular: Degraded by motion, No suspicious intracranial vascular hyperdensity. Skull: Bone detail limited by motion, no acute osseous abnormality identified. Sinuses/Orbits: Clear. Other: Large generalized left side scalp and face superficial soft tissue hematoma measuring up to 11 millimeters in thickness. The forehead is affected. The findings continue along the lateral and anterior left orbit. Grossly negative orbits soft tissues, degraded by motion. The right scalp appears relatively spared. CT CERVICAL SPINE FINDINGS Alignment: Straightening and mild reversal of cervical lordosis. Cervicothoracic junction alignment is within normal limits. Bilateral posterior element alignment is within normal limits. Skull base and vertebrae: Lower cervical spine bone detail is decreased by large body habitus. Visualized skull  base is intact. No atlanto-occipital dissociation. No cervical spine fracture identified. Soft tissues and spinal canal: There is a retropharyngeal course of both carotid arteries in the neck. Grossly negative noncontrast deep paraspinal soft tissues. Disc levels: There is broad-based right foraminal disc osteophyte degeneration at C3-C4 with severe right C4 foraminal stenosis. No other significant cervical spine degeneration is evident. Upper chest: The visible T1 level appears intact. IMPRESSION: 1. Acute right basal ganglia hemorrhage with estimated blood volume of 28 mL. Underlying chronic cerebral ischemia, including previous right PCA infarct. 2. Surrounding edema with regional mass effect including leftward midline shift up to 9 mm. Basilar cisterns are patent. 3. Subtotal effacement of the right lateral ventricle, but no intraventricular extension of blood and no ventriculomegaly. 4. Large left scalp hematoma. No underlying skull fracture identified. 5.  No acute fracture in the cervical spine. Electronically Signed: By: Odessa Fleming M.D. On: 02/16/2018 18:23   Dg Chest Port 1 View  Result Date: 02/16/2018 CLINICAL DATA:  Central catheter placement EXAM: PORTABLE CHEST 1 VIEW COMPARISON:  Study obtained earlier in the day FINDINGS: Central catheter tip is in the superior vena cava. No pneumothorax. There is no appreciable edema or consolidation. There is stable cardiomegaly. Pulmonary vascular is normal. No adenopathy. No bone lesions. IMPRESSION: Central catheter  tip in superior vena cava. No pneumothorax. Stable cardiomegaly. No edema or consolidation. Electronically Signed   By: Bretta Bang III M.D.   On: 02/16/2018 23:42   Dg Chest Port 1 View  Result Date: 02/16/2018 CLINICAL DATA:  Acute respiratory failure with hypoxia. EXAM: PORTABLE CHEST 1 VIEW COMPARISON:  Radiograph earlier this day. FINDINGS: Unchanged cardiomegaly and mediastinal contours. Unchanged retrocardiac opacity. Mild bronchial  thickening. No new focal airspace disease. No pneumothorax. IMPRESSION: 1. Unchanged retrocardiac opacity which may be combination of pleural fluid and atelectasis or airspace disease. 2. Unchanged cardiomegaly. 3. Mild central bronchial thickening. Electronically Signed   By: Rubye Oaks M.D.   On: 02/16/2018 22:00   Dg Abd Portable 1v  Result Date: 02/17/2018 CLINICAL DATA:  NG tube advancement. EXAM: PORTABLE ABDOMEN - 1 VIEW COMPARISON:  2 hours prior. FINDINGS: The enteric tube has been advanced with tip and side-port below the diaphragm in the stomach. No bowel dilatation in the visualized upper abdomen. IMPRESSION: Tip and side port of the enteric tube below the diaphragm in the stomach. Electronically Signed   By: Rubye Oaks M.D.   On: 02/17/2018 03:37   Dg Abd Portable 1v  Result Date: 02/17/2018 CLINICAL DATA:  NG tube advancement. EXAM: PORTABLE ABDOMEN - 1 VIEW COMPARISON:  1 hour prior. FINDINGS: Minimal advancement of the enteric tube, tip in the region of the distal esophagus. Advancement of an additional 10 cm is recommended to place the side-port below the diaphragm. IMPRESSION: Tip of the enteric tube remains in the region of the distal esophagus, recommend advancement of an additional 10 cm to place the side-port below the diaphragm. Electronically Signed   By: Rubye Oaks M.D.   On: 02/17/2018 02:08   Dg Abd Portable 1v  Result Date: 02/17/2018 CLINICAL DATA:  NG tube placement. EXAM: PORTABLE ABDOMEN - 1 VIEW COMPARISON:  None. FINDINGS: Tip of the enteric tube in the distal esophagus, recommend advancement of at least 10 cm to place the side-port below the diaphragm. Cardiomegaly is partially included. IMPRESSION: Tip of the enteric tube in the distal esophagus, recommend advancement of least 10 cm to place the side-port below the diaphragm. Electronically Signed   By: Rubye Oaks M.D.   On: 02/17/2018 01:01     STUDIES:  Repeat CT early this morning did  not show progression of his basal ganglia hemorrhage   DISCUSSION:      This is a 52 year old hypertensive with a history of chronic renal disease and diastolic congestive heart failure as well as diabetes who slumped to the floor and was found to have a right basal ganglia hemorrhage.  ASSESSMENT / PLAN:  PULMONARY A: No active issues, there is no difficulty in maintaining his airway  CARDIOVASCULAR A: No indication of CHF on exam despite history of diastolic dysfunction.  He continues on Cardene for blood pressure control, there is a feeding tube in place with which we can begin reinitiating enteral agents.   RENAL A: I am concerned by his very marginal renal function.  Obviously we will be avoiding any nephrotoxins and I have placed him on a small maintenance IV in order to ensure that he does not suffer from a concurrent prerenal insult.   GASTROINTESTINAL A: GI prophylaxis is with Protonix  HEMATOLOGIC A: No chemical DVT prophylaxis due to intracranial hemorrhage  INFECTIOUS A: Currently no active issues   ENDOCRINE A: Sliding scale insulin is currently in place for diabetic control     NEUROLOGIC A:  This is day 2 status post right basal ganglia hemorrhage.  Nicardipine is being employed for blood pressure control.  There has been no progression of his hemorrhage in the first 24 hours.   Penny PiaWJ Gray, MD Pulmonary and Critical Care Medicine [facility] Pager: [phone]  02/17/2018, 10:13 AM

## 2018-02-17 NOTE — Progress Notes (Signed)
OT Cancellation Note  Patient Details Name: Loleta ChanceReginald F Berquist MRN: [id number] DOB: Oct 14, 1966   Cancelled Treatment:    Reason Eval/Treat Not Completed: Patient not medically ready;Active bedrest order. OT will check back as appropriate to initiate evaluation.  Doristine Sectionharity A Syeda Prickett, MS OTR/L  Pager: [phone]   Doristine SectionCharity A Johaan Ryser 02/17/2018, 6:48 AM

## 2018-02-17 NOTE — Evaluation (Signed)
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 He will benefit from acute OT with follow up OT on CIR to work back towards PLOF.     Follow Up Recommendations  CIR;Supervision/Assistance - 24 hour    Equipment Recommendations  Other (comment)(TBD at next venue)    Recommendations for Other Services Rehab consult     Precautions / Restrictions Precautions Precautions: Fall Precaution Comments: left inattention, decreased sensation left side but pain response intact Restrictions Weight Bearing Restrictions: No      Mobility Bed Mobility Overal bed mobility: Needs Assistance Bed Mobility: Supine to Sit;Sit to Supine     Supine to sit: Total assist;+2 for physical assistance;HOB elevated(helicopter technique with bed pad) Sit to supine: Total assist;+2 for physical assistance         Balance Overall balance assessment: Needs assistance Sitting-balance support: Feet supported;Single extremity supported   Sitting balance - Comments: pt varied from fair to zero with pushing with RUE                                   ADL either performed or assessed with clinical judgement   ADL Overall ADL's : Needs assistance/impaired Eating/Feeding: NPO   Grooming: Moderate assistance;Bed level   Upper Body Bathing: Maximal assistance;Bed level   Lower Body Bathing: Total assistance;Bed level   Upper Body  Dressing : Total assistance;Bed level   Lower Body Dressing: Total assistance;Bed level                       Vision Baseline Vision/History: Wears glasses Wears Glasses: Reading only Patient Visual Report: No change from baseline Vision Assessment?: Yes Eye Alignment: Within Functional Limits Ocular Range of Motion: Within Functional Limits Alignment/Gaze Preference: Head tilt(left in sitting; in supine right gaze preference) Tracking/Visual Pursuits: Able to track stimulus in all quads without difficulty Convergence: Within functional limits Visual Fields: (left inferior quadranopsia)            Pertinent Vitals/Pain Pain Assessment: No/denies pain     Hand Dominance Right   Extremity/Trunk Assessment Upper Extremity Assessment Upper Extremity Assessment: LUE deficits/detail RUE Deficits / Details: Flaccid, only response to pain   Lower Extremity Assessment Lower Extremity Assessment: LLE deficits/detail LLE Deficits / Details: some proximal trace activation, flaccid distal. No withdrawl to pain distally LLE Sensation: decreased light touch LLE Coordination: (absent)   Cervical / Trunk Assessment Cervical / Trunk Assessment: (increased body habitus)   Communication Communication Communication: No difficulties   Cognition Arousal/Alertness: Awake/alert Behavior During Therapy: Flat affect Overall Cognitive Status: Impaired/Different from baseline Area of Impairment: Following commands;Safety/judgement;Awareness;Problem solving                       Following Commands: Follows one step commands consistently Safety/Judgement: Decreased awareness of safety;Decreased awareness of deficits Awareness: Intellectual Problem Solving: Requires verbal cues;Requires  tactile cues;Difficulty sequencing General Comments: slow to answer questions, decreased awareness of lines on his right side moving RUE to help with balance and trying to scratch his RUE up  against his body              Home Living Family/patient expects to be discharged to:: Private residence Living Arrangements: Spouse/significant other Available Help at Discharge: Family;Available 24 hours/day Type of Home: House Home Access: Stairs to enter [employer] of Steps: 1 Entrance Stairs-Rails: None Home Layout: Two level;Bed/bath upstairs Alternate Level Stairs-Number of Steps: steps, landing, steps Alternate Level Stairs-Rails: Right Bathroom Shower/Tub: Walk-in shower;Curtain   Bathroom Toilet: Handicapped height     Home Equipment: Emergency planning/management officer - 2 wheels          Prior Functioning/Environment Level of Independence: Independent                 OT Problem List: Decreased strength;Decreased range of motion;Impaired balance (sitting and/or standing);Impaired sensation;Decreased safety awareness;Decreased cognition;Impaired UE functional use;Obesity;Impaired vision/perception;Decreased knowledge of use of DME or AE;Impaired tone      OT Treatment/Interventions: Self-care/ADL training;Balance training;Therapeutic activities;Visual/perceptual remediation/compensation;Patient/family education;Neuromuscular education;Therapeutic exercise    OT Goals(Current goals can be found in the care plan section) Acute Rehab OT Goals Patient Stated Goal: did not state OT Goal Formulation: With patient Time For Goal Achievement: 03/03/18 Potential to Achieve Goals: Fair  OT Frequency: Min 3X/week           Co-evaluation PT/OT/SLP Co-Evaluation/Treatment: Yes Reason for Co-Treatment: Complexity of the patient's impairments (multi-system involvement);For patient/therapist safety PT goals addressed during session: Mobility/safety with mobility OT goals addressed during session: ADL's and self-care      AM-PAC PT "6 Clicks" Daily Activity     Outcome Measure Help from another person eating meals?: Total Help from another person taking care of  personal grooming?: A Lot Help from another person toileting, which includes using toliet, bedpan, or urinal?: Total Help from another person bathing (including washing, rinsing, drying)?: Total Help from another person to put on and taking off regular upper body clothing?: Total Help from another person to put on and taking off regular lower body clothing?: Total 6 Click Score: 7   End of Session    Activity Tolerance: Patient tolerated treatment well Patient left: in bed;with call bell/phone within reach  OT Visit Diagnosis: Other abnormalities of gait and mobility (R26.89);Muscle weakness (generalized) (M62.81);Low vision, both eyes (H54.2);Other symptoms and signs involving cognitive function;Hemiplegia and hemiparesis Hemiplegia - Right/Left: Left Hemiplegia - dominant/non-dominant: Dominant Hemiplegia - caused by: Nontraumatic intracerebral hemorrhage                Time: 9604-5409 OT Time Calculation (min): 34 min Charges:  OT General Charges $OT Visit: 1 Visit OT Evaluation $OT Eval Moderate Complexity: [address]  Ignacia Palma, [place] [phone] 02/17/2018

## 2018-02-17 NOTE — Progress Notes (Signed)
STROKE TEAM PROGRESS NOTE   HISTORY OF PRESENT ILLNESS (per record) [name] [name] is an 52 y.o. male  AA RH with PMH of uncontrolled HTN, CKD, insulin-dependent T2DM presented to [facility] ER .  Pt reports around 6PM last night, he was sitting ina desk chair and dropped his phone and slipped to the floor. His family tried to get him up but he refused syainghe wsa fine and would get up. He was still in the floor today and so the called EMS.Now he endorses mild headacheand mild nausea. BP was >200 systolic on arrival. He was started on Cerdene gtt at [facility] and later switched to Kleveprix drip - but still not under control on arrival [facility].    Date last known well: 3.16.19 Time last known well: 6pm tPA Given: no, ICH NIHSS: 17 Baseline MRS 0     SUBJECTIVE (INTERVAL HISTORY) No family members present.  The patient is fairly alert.  He is very dysarthric.His blood pressure is controlled on Cardene drip. He is on hypertonic saline.    OBJECTIVE Temp:  [97.7 F (36.5 C)-98.1 F (36.7 C)] 98.1 F (36.7 C) (03/18 0400) Pulse Rate:  [61-88] 67 (03/18 0800) Cardiac Rhythm: Normal sinus rhythm (03/18 0400) Resp:  [16-33] 22 (03/18 0800) BP: (129-251)/(58-190) 144/66 (03/18 0800) SpO2:  [89 %-100 %] 93 % (03/18 0800) Arterial Line BP: (140-187)/(59-87) 181/73 (03/18 0800) Weight:  [387 lb 9.1 oz (175.8 kg)] 387 lb 9.1 oz (175.8 kg) (03/18 0400)  CBC:  Recent Labs  Lab 02/16/18 1714 02/16/18 1721 02/17/18 0459  WBC 8.6  --  11.1*  NEUTROABS 6.9  --   --   HGB 10.5* 11.6* 9.2*  HCT 33.2* 34.0* 29.4*  MCV 84.1  --  82.8  PLT 331  --  275    Basic Metabolic Panel:  Recent Labs  Lab 02/16/18 2220 02/17/18 0459  NA 138 138  K 4.6 5.0  CL 113* 112*  CO2 17* 18*  GLUCOSE 161* 186*  BUN 39* 43*  CREATININE 3.64* 4.09*  CALCIUM 8.4* 8.5*  MG  --  2.2  PHOS  --  6.0*    Lipid Panel:     Component Value Date/Time   TRIG 70 02/16/2018 2220    HgbA1c:  Lab Results  Component Value Date   HGBA1C 12.5 (H) 01/08/2014   Urine Drug Screen:     Component Value Date/Time   LABOPIA NONE DETECTED 02/16/2018 1842   COCAINSCRNUR NONE DETECTED 02/16/2018 1842   LABBENZ NONE DETECTED 02/16/2018 1842   AMPHETMU NONE DETECTED 02/16/2018 1842   THCU NONE DETECTED 02/16/2018 1842   LABBARB NONE DETECTED 02/16/2018 1842    Alcohol Level     Component Value Date/Time   ETH <10 02/16/2018 2220    IMAGING  Dg Chest 1 View 02/16/2018 IMPRESSION:  1. Confluent left lower lobe opacity compatible with collapse or consolidation.  2. Right lung ventilation is within normal limits.  3. Chronic cardiomegaly.    Dg Pelvis 1-2 Views 02/16/2018 IMPRESSION:  No acute fracture or dislocation identified about the pelvis.    Ct Head Wo Contrast 02/17/2018 IMPRESSION:  1. No significant interval change in size and appearance of right basal ganglia intraparenchymal hemorrhage, estimated volume 28 mL. Regional mass effect with localized 7 mm right-to-left shift relatively stable. No hydrocephalus or ventricular trapping.  2. Evolving left frontotemporal scalp contusion.  3. No other acute intracranial abnormality.  4. Underlying atrophy with chronic small vessel ischemic  disease and remote right PCA territory infarct.    Ct Head Wo Contrast 02/16/2018   IMPRESSION:  1. Acute right basal ganglia hemorrhage with estimated blood volume of 28 mL. Underlying chronic cerebral ischemia, including previous right PCA infarct.  2. Surrounding edema with regional mass effect including leftward midline shift up to 9 mm. Basilar cisterns are patent.  3. Subtotal effacement of the right lateral ventricle, but no intraventricular extension of blood and no ventriculomegaly.  4. Large left scalp hematoma. No underlying skull fracture identified.   Ct Cervical Spine Wo Contrast 02/16/2018   No acute fracture in the cervical spine.    Dg Chest Port 1  View 02/16/2018 IMPRESSION:  Central catheter tip in superior vena cava. No pneumothorax. Stable cardiomegaly. No edema or consolidation.    Dg Chest Port 1 View 02/16/2018 IMPRESSION:  1. Unchanged retrocardiac opacity which may be combination of pleural fluid and atelectasis or airspace disease.  2. Unchanged cardiomegaly.  3. Mild central bronchial thickening.    Dg Abd Portable 1v\ 02/17/2018 IMPRESSION:  Tip and side port of the enteric tube below the diaphragm in the stomach.    Dg Abd Portable 1v 02/17/2018 IMPRESSION:  Tip of the enteric tube remains in the region of the distal esophagus, recommend advancement of an additional 10 cm to place the side-port below the diaphragm.    Dg Abd Portable 1v 02/17/2018 IMPRESSION:  Tip of the enteric tube in the distal esophagus, recommend advancement of least 10 cm to place the side-port below the diaphragm.    Transthoracic Echocardiogram - pending 00/00/00    Bilateral Carotid Dopplers - pending 00/00/00     PHYSICAL EXAM Vitals:   02/17/18 0600 02/17/18 0630 02/17/18 0700 02/17/18 0800  BP: (!) 162/83 (!) 152/73 (!) 149/74 (!) 144/66  Pulse: 66 66 66 67  Resp: (!) 25 18 (!) 22 (!) 22  Temp:      TempSrc:      SpO2: 100% 98% 98% 93%  Weight:      Height:       Obese middle-aged African-American male currently not in distress. . Afebrile. Head is nontraumatic. Neck is supple without bruit.    Cardiac exam no murmur or gallop. Lungs are clear to auscultation. Distal pulses are well felt. Neurological Exam :  Awake alert oriented 2. Dysarthria but can be easily understood. Right gaze preference but can look to the left and midline. Blinks to threat on the right but not the left. Left lower facial weakness. Tongue midline. Normal strength on the right side. Left dense hemiplegia with only trace toe flexion on the left leg. Withdraws left leg. Trace withdrawal in the left upper extremity only. Tone is diminished on the  left normal on the right. Deep tendon reflexes absent on the left and present on the right. Left plantar upgoing right downgoing. Gait not tested.  ASSESSMENT/PLAN Mr. [name] ChanceReginald F [name] is a 52 y.o. male with history of hypertension, chronic kidney disease, diabetes, and right transmetatarsal amputation presenting with headache, nausea, and elevated blood pressure. He did not receive IV t-PA due to ICH.  Right basal ganglia intraparenchymal hemorrhage likely secondary to hypertension.  Resultant  Right gaze preference and left hemiplegia  CT head - Acute right basal ganglia hemorrhage with estimated blood volume of 28 mL.  MRI head - not performed  MRA head - not performed  Carotid Doppler - pending  2D Echo - pending  LDL - will order  HgbA1c - will order  VTE prophylaxis - SCDs Diet NPO time specified Fall precautions  No antithrombotic prior to admission, now on No antithrombotic  Ongoing aggressive stroke risk factor management  Therapy recommendations:  pending  Disposition:  Pending  Hypertension  Stable  Systolic blood pressure goal 160-170 at this time  Long-term BP goal normotensive  Hyperlipidemia  Lipid lowering medication PTA:  none  LDL pending, goal < 70  Current lipid lowering medication: none  Await LDL -start statin at discharge if indicated.  Diabetes  HgbA1c pending, goal < 7.0  Unc / Controlled  Other Stroke Risk Factors  Obesity, Body mass index is 49.76 kg/m., recommend weight loss, diet and exercise as appropriate   Previous right PCA infarct by imaging.   Cerebral Edema  CT  Today - 7 mm right-to-left shift relatively stable - improved from 9 mm yesterday. Start hypertonic saline with sodium goal 150-155  Other Active Problems  Chronic kidney disease -gentle hydration  Anemia - likely chronic  Mild leukocytosis - afebrile -Decadron yesterday  NPO - meds per tube  CXR yesterday -  Confluent LLL opacity compatible  with collapse or consolidation.    Plan / Recommendations   Start hypertonic saline at 75 mL an hour with sodium goal of 150-155  Resume home blood pressure medications -Norvasc 10 mg QD Labetalol 200 mg BID  Check labs in a.m.   Hospital day # 1  Delton See PA-C [facility] Pager [phone] 02/17/2018, 1:13 PM I have personally examined this patient, reviewed notes, independently viewed imaging studies, participated in medical decision making and plan of care.ROS completed by me personally and pertinent positives fully documented  I have made any additions or clarifications directly to the above note. Agree with note above. He presented with right basal ganglia hemorrhage secondary to uncontrolled hypertension. Follow-up imaging shows stable appearance but there is 7 mm right-to-left shift with cytotoxic edema. Recommend strict blood pressure control and close neurological monitoring. Start hypertonic saline with sodium goal 150-155. No family is available at the bedside for discussion. This patient is critically ill and at significant risk of neurological worsening, death and care requires constant monitoring of vital signs, hemodynamics,respiratory and cardiac monitoring, extensive review of multiple databases, frequent neurological assessment, discussion with family, other specialists and medical decision making of high complexity.I have made any additions or clarifications directly to the above note.This critical care time does not reflect procedure time, or teaching time or supervisory time of PA/NP/Med Resident etc but could involve care discussion time.  I spent 30 minutes of neurocritical care time  in the care of  this patient.     Delia Heady, MD Medical Director [facility] Pager: [phone] 02/17/2018 5:10 PM   To contact Stroke Continuity provider, please refer to [email or web address]. After hours, contact General Neurology

## 2018-02-17 NOTE — Progress Notes (Signed)
Inpatient Rehabilitation  Per SLP request, patient was screened by Fae PippinMelissa Deneice Wack for appropriateness for an Inpatient Acute Rehab consult.  At this time will plan to follow along for OT/PT recommendations prior to requesting a formal consult.  Call if questions.   Charlane FerrettiMelissa Jewelz Ricklefs, M.A., CCC/SLP Admission Coordinator  [facility]  Cell [phone]

## 2018-02-17 NOTE — Progress Notes (Signed)
PT Cancellation Note  Patient Details Name: Loleta ChanceReginald F Howland MRN: [id number] DOB: [birth year]   Cancelled Treatment:    Reason Eval/Treat Not Completed: Patient not medically ready;Active bedrest order   Fabio AsaDevon J Cashtyn Pouliot 02/17/2018, 7:52 AM Charlotte Crumbevon Tacoya Altizer, PT DPT  Board Certified Neurologic Specialist [phone]

## 2018-02-17 NOTE — Evaluation (Signed)
Clinical/Bedside Swallow Evaluation Patient Details  Name: [name] [name] MRN: [id number] Date of Birth: [birth year]  Today's Date: 02/17/2018 Time: SLP Start Time (ACUTE ONLY): 0910 SLP Stop Time (ACUTE ONLY): 0924 SLP Time Calculation (min) (ACUTE ONLY): 14 min  Past Medical History:  Past Medical History:  Diagnosis Date  . Cellulitis and abscess of leg 01/08/2014   RT LEG  . Diabetes mellitus   . Diverticulitis   . Hypertension    Past Surgical History:  Past Surgical History:  Procedure Laterality Date  . COLON SURGERY    . I&D EXTREMITY Right 01/09/2014   Procedure: IRRIGATION AND DEBRIDEMENT EXTREMITY;  Surgeon: Senaida Lange, MD;  Location: [facility] OR;  Service: Orthopedics;  Laterality: Right;  . TRANSMETATARSAL AMPUTATION Right 01/15/2014   Procedure: TRANSMETATARSAL AMPUTATION;  Surgeon: Nadara Mustard, MD;  Location: [facility] OR;  Service: Orthopedics;  Laterality: Right;   HPI:  Pt reports around 6PM last night, he was sitting ina desk chair and dropped his phoneand slipped to the floor. His family tried to get him up but he refused syainghe wsa fine and would get up. He was still in the floor today and so the called EMS.Now he endorses mild headacheand mild nausea.BP was >200 systolic on arrival. He was started on Cerdene gtt at [facility] and later switched to Kleveprix drip - but still not under control on arrival [facility].CT confirmed Right basal ganglia hemorrhage.    Assessment / Plan / Recommendation Clinical Impression  Pt admitted yesterday after sliding out of chair and unable to get up. Pt presents with left side paralysis. Oral motor exam revealed left sided facial droop with poor lip seal and tongue strength and coordination on left as well. Presentation of ice chips, water and applesauce all revealed poor bolus control, throat clearing and coughing as well as oral residue. Pt also has a NG in place that may be impacting pharyngeal swallow. Recommend a FEES to  evaluate pharyngeal swallow more effectively and for diet recommendations. Communicated plan with nurse and patient. Goals to be determined after instrumental testing.  SLP Visit Diagnosis: Dysphagia, oropharyngeal phase (R13.12)    Aspiration Risk  Moderate aspiration risk    Diet Recommendation NPO   Medication Administration: Via alternative means    Other  Recommendations Oral Care Recommendations: Oral care QID   Follow up Recommendations   FEES                 Prognosis Prognosis for Safe Diet Advancement: Fair      Swallow Study   General Date of Onset: 02/16/18 HPI: Pt reports around 6PM last night, he was sitting ina desk chair and dropped his phoneand slipped to the floor. His family tried to get him up but he refused syainghe wsa fine and would get up. He was still in the floor today and so the called EMS.Now he endorses mild headacheand mild nausea.BP was >200 systolic on arrival. He was started on Cerdene gtt at [facility] and later switched to Kleveprix drip - but still not under control on arrival [facility].CT confirmed Right basal ganglia hemorrhage.  Type of Study: Bedside Swallow Evaluation Previous Swallow Assessment: none Diet Prior to this Study: NPO Temperature Spikes Noted: No Respiratory Status: Nasal cannula History of Recent Intubation: No Behavior/Cognition: Cooperative;Pleasant mood;Distractible Oral Cavity Assessment: Within Functional Limits Oral Care Completed by SLP: No Oral Cavity - Dentition: Adequate natural dentition Vision: Functional for self-feeding Self-Feeding Abilities: Able to feed self  Patient Positioning: Upright in bed Baseline Vocal Quality: Normal Volitional Cough: Strong Volitional Swallow: Able to elicit    Oral/Motor/Sensory Function Overall Oral Motor/Sensory Function: Moderate impairment Facial ROM: Reduced left Facial Symmetry: Abnormal symmetry left Facial Strength: Reduced left Facial Sensation:  Reduced left Lingual ROM: Reduced left Lingual Symmetry: Abnormal symmetry left Lingual Strength: Reduced Lingual Sensation: Reduced Mandible: Within Functional Limits   Ice Chips Ice chips: Impaired Oral Phase Impairments: Reduced labial seal;Reduced lingual movement/coordination;Impaired mastication;Poor awareness of bolus Oral Phase Functional Implications: Left anterior spillage;Left lateral sulci pocketing;Prolonged oral transit;Oral residue Pharyngeal Phase Impairments: Suspected delayed Swallow;Decreased hyoid-laryngeal movement;Multiple swallows;Throat Clearing - Delayed;Throat Clearing - Immediate;Cough - Delayed   Thin Liquid Thin Liquid: Impaired Oral Phase Impairments: Poor awareness of bolus Oral Phase Functional Implications: Prolonged oral transit Pharyngeal  Phase Impairments: Suspected delayed Swallow;Decreased hyoid-laryngeal movement;Throat Clearing - Immediate    Nectar Thick Nectar Thick Liquid: Not tested   Honey Thick Honey Thick Liquid: Not tested   Puree Puree: Impaired Presentation: Spoon Oral Phase Impairments: Reduced lingual movement/coordination;Poor awareness of bolus Oral Phase Functional Implications: Left lateral sulci pocketing;Prolonged oral transit;Oral residue;Oral holding Pharyngeal Phase Impairments: Decreased hyoid-laryngeal movement;Throat Clearing - Delayed   Solid   GO   Solid: Not tested        [name] HoseSarah J. Nyal Schachter, MA, CCC-SLP 02/17/2018 9:38 AM

## 2018-02-17 NOTE — Progress Notes (Signed)
NG tube still about 10 cm above the stomach even after advancement. Pulling this one and grabbing a 16 Fr instead to see if it will work better and not coil

## 2018-02-17 NOTE — Progress Notes (Signed)
Per radiologists suggestion I advanced the NG tube another 10 cm and will reorder another xray of abdomen to confirm correct placement.

## 2018-02-17 NOTE — Procedures (Signed)
Objective Swallowing Evaluation: Type of Study: FEES-Fiberoptic Endoscopic Evaluation of Swallow   Patient Details  Name: [name] ChanceReginald F Hindley MRN: [id number] Date of Birth: 02/19/1966  Today's Date: 02/17/2018 Time: SLP Start Time (ACUTE ONLY): 1110 -SLP Stop Time (ACUTE ONLY): 1145  SLP Time Calculation (min) (ACUTE ONLY): 35 min   Past Medical History:  Past Medical History:  Diagnosis Date  . Cellulitis and abscess of leg 01/08/2014   RT LEG  . Diabetes mellitus   . Diverticulitis   . Hypertension    Past Surgical History:  Past Surgical History:  Procedure Laterality Date  . COLON SURGERY    . I&D EXTREMITY Right 01/09/2014   Procedure: IRRIGATION AND DEBRIDEMENT EXTREMITY;  Surgeon: Senaida LangeKevin M Supple, MD;  Location: [facility] OR;  Service: Orthopedics;  Laterality: Right;  . 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 His family tried to get him up but he refused syainghe wsa fine and would get up. He was still in the floor today and so the called EMS.Now he endorses mild headacheand mild nausea.BP was >200 systolic on arrival. He was [facility] on Cerdene gtt at [facility] and later switched to Kleveprix drip - but still not under control on arrival [facility].CT confirmed Right basal ganglia hemorrhage.    Subjective: Pt was woken for eval. Very tired and yawning throught evaluation.    Assessment / Plan / Recommendation  CHL IP CLINICAL IMPRESSIONS 02/17/2018  Clinical Impression Patient presents with a moderate oropharyngeal dysphagia. While pharyngeal strength is intact, patient with moderate oral weakness and moderate-severely reduced sensation which likely extends into pharynx as all provided boluses spill into the vallecula,  pyriform sinuses, or directly into the laryngeal vestibule prior the initiation of the swallow. Decreased coordination of respirations and swallow additionally impacting timing.  Thin liquids deeply penetrated and aspirated with only weak throat clear in response. Cued hard cough needed for full clearance. With all other consistencies, patient with deep flash penetration during either initial or second swallow as oral residuals pool into the larynx/pharyx. Although patient able to protect his airway with these consistencies, fluctuating levels of alertness, at time requiring max cueing to maintain eye opening, significantly increases aspiration risk. At this time, recommend small sips of nectar thick liquid PRN with nursing only when patient fully alert and engaged. SLP will f/u 3/19 for hopeful initiation of a full po diet (puree, nectar thick liquid) if alertness improving.   SLP Visit Diagnosis Dysphagia, oropharyngeal phase (R13.12)  Attention and concentration deficit following --  Frontal lobe and executive function deficit following --  Impact on safety and function Moderate aspiration risk;Severe aspiration risk      CHL IP TREATMENT RECOMMENDATION 02/17/2018  Treatment Recommendations Therapy as outlined in treatment plan below     Prognosis 02/17/2018  Prognosis for Safe Diet Advancement Good  Barriers to Reach Goals --  Barriers/Prognosis Comment --    CHL IP DIET RECOMMENDATION 02/17/2018  SLP Diet Recommendations NPO;Other (Comment);Nectar thick liquid  Liquid Administration via Cup;No straw;Spoon  Medication Administration Crushed with puree  Compensations Slow rate;Small sips/bites  Postural Changes Seated upright at 90 degrees      CHL IP OTHER RECOMMENDATIONS 02/17/2018  Recommended Consults --  Oral Care Recommendations Oral care QID  Other  Recommendations Order thickener from pharmacy;Prohibited food (jello, ice cream, thin soups);Remove water pitcher      CHL IP FOLLOW  UP RECOMMENDATIONS 02/17/2018  Follow up Recommendations Inpatient Rehab      CHL IP FREQUENCY AND DURATION 02/17/2018  Speech Therapy Frequency (ACUTE ONLY) min 3x week  Treatment Duration 2 weeks           CHL IP ORAL PHASE 02/17/2018  Oral Phase Impaired  Oral - Pudding Teaspoon --  Oral - Pudding Cup --  Oral - Honey Teaspoon Decreased bolus cohesion;Premature spillage;Lingual/palatal residue  Oral - Honey Cup Decreased bolus cohesion;Premature spillage;Lingual/palatal residue  Oral - Nectar Teaspoon Decreased bolus cohesion;Premature spillage;Lingual/palatal residue  Oral - Nectar Cup Decreased bolus cohesion;Premature spillage;Lingual/palatal residue  Oral - Nectar Straw --  Oral - Thin Teaspoon Decreased bolus cohesion;Premature spillage;Lingual/palatal residue  Oral - Thin Cup Decreased bolus cohesion;Premature spillage;Lingual/palatal residue  Oral - Thin Straw --  Oral - Puree Decreased bolus cohesion;Premature spillage;Lingual/palatal residue  Oral - Mech Soft Decreased bolus cohesion;Premature spillage;Lingual/palatal residue;Left anterior bolus loss;Delayed oral transit;Reduced posterior propulsion;Weak lingual manipulation;Impaired mastication  Oral - Regular --  Oral - Multi-Consistency --  Oral - Pill --  Oral Phase - Comment --    CHL IP PHARYNGEAL PHASE 02/17/2018  Pharyngeal Phase Impaired  Pharyngeal- Pudding Teaspoon --  Pharyngeal --  Pharyngeal- Pudding Cup --  Pharyngeal --  Pharyngeal- Honey Teaspoon Delayed swallow initiation-vallecula;Delayed swallow initiation-pyriform sinuses;Penetration/Aspiration before swallow  Pharyngeal Material enters airway, CONTACTS cords and then ejected out  Pharyngeal- Honey Cup Delayed swallow initiation-vallecula;Delayed swallow initiation-pyriform sinuses;Penetration/Aspiration before swallow  Pharyngeal Material enters airway, CONTACTS cords and then ejected out  Pharyngeal- Nectar Teaspoon Delayed swallow  initiation-vallecula;Delayed swallow initiation-pyriform sinuses;Penetration/Aspiration before swallow  Pharyngeal Material enters airway, CONTACTS cords and then ejected out  Pharyngeal- Nectar Cup Delayed swallow initiation-vallecula;Delayed swallow initiation-pyriform sinuses;Penetration/Aspiration before swallow  Pharyngeal Material enters airway, CONTACTS cords and then ejected out  Pharyngeal- Nectar Straw --  Pharyngeal --  Pharyngeal- Thin Teaspoon Delayed swallow initiation-vallecula;Delayed swallow initiation-pyriform sinuses;Penetration/Aspiration before swallow  Pharyngeal Material enters airway, passes BELOW cords and not ejected out despite cough attempt by patient  Pharyngeal- Thin Cup Delayed swallow initiation-vallecula;Delayed swallow initiation-pyriform sinuses;Penetration/Aspiration before swallow  Pharyngeal Material enters airway, passes BELOW cords and not ejected out despite cough attempt by patient  Pharyngeal- Thin Straw --  Pharyngeal --  Pharyngeal- Puree Delayed swallow initiation-vallecula;Delayed swallow initiation-pyriform sinuses;Penetration/Aspiration before swallow  Pharyngeal Material enters airway, CONTACTS cords and then ejected out  Pharyngeal- Mechanical Soft (No Data)  Pharyngeal --  Pharyngeal- Regular --  Pharyngeal --  Pharyngeal- Multi-consistency --  Pharyngeal --  Pharyngeal- Pill --  Pharyngeal --  Pharyngeal Comment --    Ferdinand Lango MA, CCC-SLP [phone]  Korianna Washer Meryl 02/17/2018, 12:18 PM

## 2018-02-18 ENCOUNTER — Inpatient Hospital Stay (HOSPITAL_COMMUNITY): Payer: 59

## 2018-02-18 DIAGNOSIS — I619 Nontraumatic intracerebral hemorrhage, unspecified: Secondary | ICD-10-CM

## 2018-02-18 DIAGNOSIS — G8194 Hemiplegia, unspecified affecting left nondominant side: Secondary | ICD-10-CM

## 2018-02-18 LAB — MAGNESIUM: Magnesium: 2.3 mg/dL (ref 1.7–2.4)

## 2018-02-18 LAB — GLUCOSE, CAPILLARY
GLUCOSE-CAPILLARY: 102 mg/dL — AB (ref 65–99)
GLUCOSE-CAPILLARY: 134 mg/dL — AB (ref 65–99)
GLUCOSE-CAPILLARY: 140 mg/dL — AB (ref 65–99)
GLUCOSE-CAPILLARY: 155 mg/dL — AB (ref 65–99)
Glucose-Capillary: 137 mg/dL — ABNORMAL HIGH (ref 65–99)
Glucose-Capillary: 140 mg/dL — ABNORMAL HIGH (ref 65–99)

## 2018-02-18 LAB — LIPID PANEL
CHOLESTEROL: 164 mg/dL (ref 0–200)
HDL: 36 mg/dL — ABNORMAL LOW (ref >40)
LDL Cholesterol: 108 mg/dL — ABNORMAL HIGH (ref 0–99)
Total CHOL/HDL Ratio: 4.6 RATIO
Triglycerides: 99 mg/dL (ref <150)
VLDL: 20 mg/dL (ref 0–40)

## 2018-02-18 LAB — CBC WITH DIFFERENTIAL/PLATELET
Basophils Absolute: 0 10*3/uL (ref 0.0–0.1)
Basophils Relative: 0 %
EOS ABS: 0.1 10*3/uL (ref 0.0–0.7)
EOS PCT: 1 %
HCT: 28.2 % — ABNORMAL LOW (ref 39.0–52.0)
Hemoglobin: 8.9 g/dL — ABNORMAL LOW (ref 13.0–17.0)
LYMPHS PCT: 13 %
Lymphs Abs: 1.6 10*3/uL (ref 0.7–4.0)
MCH: 25.9 pg — AB (ref 26.0–34.0)
MCHC: 31.6 g/dL (ref 30.0–36.0)
MCV: 82.2 fL (ref 78.0–100.0)
MONO ABS: 1.2 10*3/uL — AB (ref 0.1–1.0)
Monocytes Relative: 10 %
Neutro Abs: 9 10*3/uL — ABNORMAL HIGH (ref 1.7–7.7)
Neutrophils Relative %: 76 %
PLATELETS: 268 10*3/uL (ref 150–400)
RBC: 3.43 MIL/uL — AB (ref 4.22–5.81)
RDW: 16.1 % — AB (ref 11.5–15.5)
WBC: 11.9 10*3/uL — AB (ref 4.0–10.5)

## 2018-02-18 LAB — BASIC METABOLIC PANEL
Anion gap: 11 (ref 5–15)
BUN: 50 mg/dL — AB (ref 6–20)
CALCIUM: 8.2 mg/dL — AB (ref 8.9–10.3)
CHLORIDE: 113 mmol/L — AB (ref 101–111)
CO2: 17 mmol/L — AB (ref 22–32)
CREATININE: 5.01 mg/dL — AB (ref 0.61–1.24)
GFR calc non Af Amer: 12 mL/min — ABNORMAL LOW (ref >60)
GFR, EST AFRICAN AMERICAN: 14 mL/min — AB (ref >60)
Glucose, Bld: 128 mg/dL — ABNORMAL HIGH (ref 65–99)
Potassium: 5.1 mmol/L (ref 3.5–5.1)
Sodium: 141 mmol/L (ref 135–145)

## 2018-02-18 LAB — SODIUM
SODIUM: 134 mmol/L — AB (ref 135–145)
SODIUM: 140 mmol/L (ref 135–145)
SODIUM: 144 mmol/L (ref 135–145)

## 2018-02-18 LAB — URINALYSIS, ROUTINE W REFLEX MICROSCOPIC
BILIRUBIN URINE: NEGATIVE
Glucose, UA: 50 mg/dL — AB
KETONES UR: NEGATIVE mg/dL
Nitrite: NEGATIVE
Protein, ur: >=300 mg/dL — AB
SPECIFIC GRAVITY, URINE: 1.011 (ref 1.005–1.030)
pH: 5 (ref 5.0–8.0)

## 2018-02-18 LAB — HEMOGLOBIN A1C
Hgb A1c MFr Bld: 6.4 % — ABNORMAL HIGH (ref 4.8–5.6)
MEAN PLASMA GLUCOSE: 136.98 mg/dL

## 2018-02-18 LAB — HIV ANTIBODY (ROUTINE TESTING W REFLEX): HIV Screen 4th Generation wRfx: NONREACTIVE

## 2018-02-18 LAB — PHOSPHORUS: Phosphorus: 6.7 mg/dL — ABNORMAL HIGH (ref 2.5–4.6)

## 2018-02-18 LAB — PROCALCITONIN: PROCALCITONIN: 0.31 ng/mL

## 2018-02-18 MED ORDER — SODIUM CHLORIDE 0.9% FLUSH
10.0000 mL | INTRAVENOUS | Status: DC | PRN
  Administered 2018-02-25: 10 mL
  Filled 2018-02-18: qty 40

## 2018-02-18 MED ORDER — CLONIDINE HCL 0.2 MG/24HR TD PTWK
0.2000 mg | MEDICATED_PATCH | TRANSDERMAL | Status: DC
  Administered 2018-02-18: 0.2 mg via TRANSDERMAL
  Filled 2018-02-18: qty 1

## 2018-02-18 MED ORDER — SODIUM CHLORIDE 3 % IV SOLN
INTRAVENOUS | Status: DC
  Administered 2018-02-18: 100 mL/h via INTRAVENOUS
  Administered 2018-02-19: 85 mL/h via INTRAVENOUS
  Administered 2018-02-20: 21 mL/h via INTRAVENOUS
  Filled 2018-02-18 (×12): qty 500

## 2018-02-18 MED ORDER — SODIUM CHLORIDE 0.9% FLUSH
10.0000 mL | Freq: Two times a day (BID) | INTRAVENOUS | Status: DC
  Administered 2018-02-18 – 2018-02-23 (×9): 10 mL
  Administered 2018-02-24: 20 mL
  Administered 2018-02-26 – 2018-03-05 (×7): 10 mL

## 2018-02-18 NOTE — Progress Notes (Signed)
Preliminary results by tech - Carotid duplex completed. No evidence of a significant stenosis in bilateral carotid arteries, 1-39%. [name] [name], BS, RDMS, RVT

## 2018-02-18 NOTE — Evaluation (Signed)
Speech Language Pathology Evaluation Patient Details Name: [name] [name] MRN: [id number] DOB: 11/22/1966 Today's Date: 02/18/2018 Time: [id number] SLP Time Calculation (min) (ACUTE ONLY): 9 min  Problem List:  Patient Active Problem List   Diagnosis Date Noted  . Hemorrhagic stroke (HCC) 02/16/2018  . Acute intracerebral hemorrhage (HCC) 02/16/2018  . Acute renal failure (HCC)   . 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 Morbid obesity (HCC) 10/07/2014  . DOE (dyspnea on exertion) 08/26/2014  . Insulin dependent type 2 diabetes mellitus, uncontrolled (HCC) 01/11/2014  . 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 Cellulitis and abscess 01/08/2014  . Hypertensive emergency 01/08/2014  . Diabetes mellitus, type 2 (HCC) 01/08/2014  . Sepsis (HCC) 01/08/2014   Past Medical History:  Past Medical History:  Diagnosis Date  . Cellulitis and abscess of leg 01/08/2014   RT LEG  . Diabetes mellitus   . Diverticulitis   . Hypertension    Past Surgical History:  Past Surgical History:  Procedure Laterality Date  . COLON SURGERY    . I&D EXTREMITY Right 01/09/2014   Procedure: IRRIGATION AND DEBRIDEMENT EXTREMITY;  Surgeon: Senaida LangeKevin M Supple, MD;  Location: [facility] OR;  Service: Orthopedics;  Laterality: Right;  . TRANSMETATARSAL AMPUTATION Right 01/15/2014   Procedure: TRANSMETATARSAL AMPUTATION;  Surgeon: Nadara MustardMarcus V Duda, MD;  [facility];  Service: Orthopedics;  Laterality: Right;   HPI:  Pt reports around 6PM last night, he was sitting ina desk chair and dropped his phoneand slipped to the floor. His family tried to get him up but he refused syainghe wsa fine and would get up. He was still in the floor today and so the called EMS.Now he endorses mild headacheand mild nausea.BP was >200 systolic on arrival. He was started on Cerdene gtt at [facility] and later switched to Kleveprix drip - but still not under control on [facility].CT confirmed Right  basal ganglia hemorrhage.    Assessment / Plan / Recommendation Clinical Impression  Pt presents with mild dysarthria due to imprecise articulation, as well as moderate cognitive deficits primarily in the areas of selective attention and awareness. Pt shows emerging intellectual awareness, stating that he knows people have told him he can't move his L side well, but he also does not believe that it is true. He shows little to no emergent awareness when asked to move his left side. Pt's working memory is impacted but with recall of daily events/information more accurate, needing only Min cues. Pt would benefit from intensive SLP f/u to maximize cognitive skills and functional independence.    SLP Assessment  SLP Recommendation/Assessment: Patient needs continued Speech Lanaguage Pathology Services SLP Visit Diagnosis: Dysarthria and anarthria (R47.1);Cognitive communication deficit (R41.841)    Follow Up Recommendations  Inpatient Rehab    Frequency and Duration min 2x/week  2 weeks      SLP Evaluation Cognition  Overall Cognitive Status: Impaired/Different from baseline Arousal/Alertness: Awake/alert Orientation Level: Oriented X4 Attention: Selective Selective Attention: Impaired Selective Attention Impairment: Verbal basic;Functional basic Memory: Impaired Memory Impairment: Decreased recall of new information Awareness: Impaired Awareness Impairment: Intellectual impairment;Emergent impairment;Anticipatory impairment Problem Solving: Impaired Problem Solving Impairment: Functional basic Safety/Judgment: Impaired       Comprehension  Auditory Comprehension Overall Auditory Comprehension: Appears within functional limits for tasks assessed    Expression Expression Primary Mode of Expression: Verbal Verbal Expression Overall Verbal Expression: Appears within functional limits for tasks assessed   Oral / Motor  Motor Speech Overall  Motor Speech: Impaired Respiration:  Within functional limits Phonation: Normal Resonance: Within functional limits Articulation: Impaired Level of Impairment: Conversation Intelligibility: Intelligibility reduced Conversation: 75-100% accurate Motor Planning: Witnin functional limits Motor Speech Errors: Not applicable   GO                    Maxcine Ham 02/18/2018, 1:16 PM  Maxcine Ham, M.A. CCC-SLP [phone]

## 2018-02-18 NOTE — Progress Notes (Signed)
Physical Therapy Treatment Patient Details Name: [name] [name] MRN: [id number] DOB: May 05, 1966 Today's Date: 02/18/2018    History of Present Illness 52 yr old male with sig history of CKD Stage Iv, HTN, DM, HFpEF, presents with an acute ICH located in the right basal ganglia with midline shift and surrounding edema    PT Comments    Pt  bed placed in egress position and pt worked on sitting balance, reaching, core activation, LE motion, and activity tolerance. Pt needed frequent redirection to task partly due to lethargy but very motivated to participate when alert. PT will continue to follow.   Follow Up Recommendations  CIR;Supervision/Assistance - 24 hour     Equipment Recommendations  Other (comment)(TBD)    Recommendations for Other Services Rehab consult     Precautions / Restrictions Precautions Precautions: Fall Precaution Comments: left inattention, decreased sensation left side but pain response intact Restrictions Weight Bearing Restrictions: No    Mobility  Bed Mobility Overal bed mobility: Needs Assistance             General bed mobility comments: placed bed in egress position with pt's feet on floor, min A for pt to sit up to EOB  Transfers                 General transfer comment: unable to perform at this time  Ambulation/Gait             General Gait Details: unable to perform   Stairs            Wheelchair Mobility    Modified Rankin (Stroke Patients Only) Modified Rankin (Stroke Patients Only) Pre-Morbid Rankin Score: No symptoms Modified Rankin: Severe disability     Balance Overall balance assessment: Needs assistance Sitting-balance support: Feet supported;No upper extremity supported Sitting balance-Leahy Scale: Fair Sitting balance - Comments: worked on motion in supported sitting and unsupported. Was able to maintain static balance in unsupported sitting but lost balance to L when using RUE for dynamic  activity Postural control: Left lateral lean                                  Cognition Arousal/Alertness: Awake/alert Behavior During Therapy: Flat affect Overall Cognitive Status: Impaired/Different from baseline Area of Impairment: Following commands;Safety/judgement;Awareness;Problem solving                       Following Commands: Follows one step commands consistently Safety/Judgement: Decreased awareness of safety;Decreased awareness of deficits Awareness: Intellectual Problem Solving: Requires verbal cues;Requires tactile cues;Difficulty sequencing General Comments: slow response time, decreased initiation      Exercises General Exercises - Lower Extremity Ankle Circles/Pumps: AROM;PROM;Right;Left;15 reps;Supine;Limitations Ankle Circles/Pumps Limitations: PROM L Long Arc Quad: AROM;PROM;Right;Left;10 reps;Limitations;Seated Long Arc Quad Limitations: PROM on L Hip Flexion/Marching: AROM;Right;Left;10 reps;Seated;Limitations Hip Flexion/Marching Limitations: pt able to lift LLE slightly with hip flexor activation and use of momentum Other Exercises Other Exercises: retrograde massage L hand Other Exercises: Passive shoulder flexion and abduction as well as passive ROM with assistance from pt's RUE    General Comments General comments (skin integrity, edema, etc.): worked on reaching activities in unsupported sitting as well as crossing midline, washing face, holding cup to drink thickened water as well as serving self with spoon. Worked on grabbing R hand with L hand and attending more to R side. Core stabilization activities performed in different planes.  Pertinent Vitals/Pain Pain Assessment: Faces Faces Pain Scale: Hurts little more Pain Location: R side neck and throat Pain Descriptors / Indicators: Sore Pain Intervention(s): Limited activity within patient's tolerance;Monitored during session    Home Living     Available Help at  Discharge: Family;Available 24 hours/day Type of Home: House              Prior Function            PT Goals (current goals can now be found in the care plan section) Acute Rehab PT Goals Patient Stated Goal: get better PT Goal Formulation: With patient Time For Goal Achievement: 03/03/18 Potential to Achieve Goals: Fair Progress towards PT goals: Progressing toward goals    Frequency    Min 3X/week      PT Plan Current plan remains appropriate    Co-evaluation              AM-PAC PT "6 Clicks" Daily Activity  Outcome Measure  Difficulty turning over in bed (including adjusting bedclothes, sheets and blankets)?: Unable Difficulty moving from lying on back to sitting on the side of the bed? : Unable Difficulty sitting down on and standing up from a chair with arms (e.g., wheelchair, bedside commode, etc,.)?: Unable Help needed moving to and from a bed to chair (including a wheelchair)?: Total Help needed walking in hospital room?: Total Help needed climbing 3-5 steps with a railing? : Total 6 Click Score: 6    End of Session   Activity Tolerance: Patient tolerated treatment well Patient left: in bed;with call bell/phone within reach;with nursing/sitter in room Nurse Communication: Mobility status;Need for lift equipment PT Visit Diagnosis: Hemiplegia and hemiparesis Hemiplegia - Right/Left: Left Hemiplegia - dominant/non-dominant: Non-dominant Hemiplegia - caused by: Nontraumatic SAH     Time: [id number] PT Time Calculation (min) (ACUTE ONLY): 26 min  Charges:  $Therapeutic Exercise: 8-22 mins $Therapeutic Activity: 8-22 mins                    G Codes:       Lyanne CoVictoria Sedona Wenk, PT  Acute Rehab Services  [phone]    [place]villeVictoria L Adelyna Brockman 02/18/2018, 1:26 PM

## 2018-02-18 NOTE — Consult Note (Signed)
Physical Medicine and Rehabilitation Consult   Reason for Consult: Right basal ganglia bleed with functional deficits.  Referring Physician: Dr. Pearlean Brownie   HPI: [name] [name] is a 52 y.o. male with history of malignant HTN, CKD, T2DM--uncontrolled who was admitted on 02/16/18 with left sided weakness, fall with inability evening prior to admission with inability to get up and still on the floor next day therefore family activated EMS. UDS negative. He was noted to be hypertensive with SBP >  200 and titrated to Cleveprix drip. CT head showed right basal ganglia hemorrhage with surrounding edema mass leftward mass effect with subtotal effacement of right lateral ventricle and large left scalp hematoma.    Repeat CT head showed bleed to be stable with no hydrocephalus, evolving frontotemporal scalp contusion and remote R-PCA infarct.   Review of Systems  Constitutional: Negative for fever.  HENT: Negative for hearing loss.   Eyes: Negative for photophobia.  Respiratory: Negative for cough.   Cardiovascular: Negative for chest pain.  Gastrointestinal: Negative for nausea and vomiting.  Genitourinary: Negative for dysuria.  Musculoskeletal: Negative for joint pain.  Skin: Negative for rash.  Neurological: Positive for dizziness, speech change and focal weakness.  Psychiatric/Behavioral: Negative for suicidal ideas.      Past Medical History:  Diagnosis Date  . Cellulitis and abscess of leg 01/08/2014   RT LEG  . Diabetes mellitus   . Diverticulitis   . Hypertension     Past Surgical History:  Procedure Laterality Date  . COLON SURGERY    . I&D EXTREMITY Right 01/09/2014   Procedure: IRRIGATION AND DEBRIDEMENT EXTREMITY;  Surgeon: Senaida Lange, MD;  Location: [facility] OR;  Service: Orthopedics;  Laterality: Right;  . 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 Hypertension Mother   . Diabetes Mellitus II Father     Social History:  Married.  reports that  has never smoked. he has never used smokeless tobacco. He reports that he does not drink alcohol or use drugs.    Allergies  Allergen Reactions  . Minocycline Anaphylaxis    SOB, itching, hives.  . Shellfish Allergy Nausea And Vomiting   Medications Prior to Admission  Medication Sig Dispense Refill  . amLODipine (NORVASC) 10 MG tablet Take 1 tablet (10 mg total) by mouth daily. 30 tablet 0  . furosemide (LASIX) 20 MG tablet Take 40 mg by mouth daily.     . Insulin Detemir (LEVEMIR) 100 UNIT/ML Pen Inject 22 Units into the skin daily at 10 pm. 15 mL 2  . labetalol (NORMODYNE) 200 MG tablet Take 200 mg by mouth 2 (two) times daily.     Marland Kitchen LORazepam (ATIVAN) 0.5 MG tablet Take 0.5 mg by mouth 2 (two) times daily as needed for anxiety.    . tamsulosin (FLOMAX) 0.4 MG CAPS capsule Take one capsule daily until stone passes. (Patient not taking: Reported on 02/16/2018) 15 capsule 0    Home: Home Living Family/patient expects to be discharged to:: Private residence Living Arrangements: Spouse/significant other Available Help at Discharge: Family, Available 24 hours/day Type of Home: House Home Access: Stairs to enter [employer] of Steps: 1 Entrance Stairs-Rails: None Home Layout: Two level, Bed/bath upstairs Alternate Level Stairs-Number of Steps: steps, landing, steps Alternate Level Stairs-Rails: Right Bathroom Shower/Tub: Walk-in shower, Curtain  Bathroom Toilet: Handicapped height Home Equipment: Information systems managerhower seat, Environmental consultantWalker - 2 wheels  Functional History: Prior Function Level of Independence: Independent Functional Status:  Mobility: Bed Mobility Overal bed mobility: Needs Assistance Bed Mobility: Supine to Sit, Sit to Supine Supine to sit: Total assist, +2 for physical assistance, HOB  elevated(helicopter technique with bed pad) Sit to supine: Total assist, +2 for physical assistance Transfers General transfer comment: unable to perform at this time Ambulation/Gait General Gait Details: unable to perform    ADL: ADL Overall ADL's : Needs assistance/impaired Eating/Feeding: NPO Grooming: Moderate assistance, Bed level Upper Body Bathing: Maximal assistance, Bed level Lower Body Bathing: Total assistance, Bed level Upper Body Dressing : Total assistance, Bed level Lower Body Dressing: Total assistance, Bed level  Cognition: Cognition Overall Cognitive Status: Impaired/Different from baseline Orientation Level: Oriented X4 Cognition Arousal/Alertness: Awake/alert Behavior During Therapy: Flat affect Overall Cognitive Status: Impaired/Different from baseline Area of Impairment: Following commands, Safety/judgement, Awareness, Problem solving Following Commands: Follows one step commands consistently Safety/Judgement: Decreased awareness of safety, Decreased awareness of deficits Awareness: Intellectual Problem Solving: Requires verbal cues, Requires tactile cues, Difficulty sequencing General Comments: slow to answer questions, decreased awareness of lines on his right side moving RUE to help with balance and trying to scratch his RUE up against his body   Blood pressure (!) 146/68, pulse 71, temperature 98.9 F (37.2 C), temperature source Oral, resp. rate 18, height 6\' 2"  (1.88 m), weight (!) 175.8 kg (387 lb 9.1 oz), SpO2 98 %. Physical Exam  Constitutional:  Obese, alert  HENT:  Head: Normocephalic.  Eyes: Pupils are equal, round, and reactive to light.  Neck: Normal range of motion.  Cardiovascular: Normal rate.  Respiratory: Effort normal.  GI: Soft.  Musculoskeletal: He exhibits edema (2+ LUE and LLE).  Neurological: He is alert.  Pt oriented to place and reason he's here.  Provided me biographical information.  Left central 7 and tongue  deviation. Speech slurred. Language appears intact. LUE 0-tr/5 prox to distal. LLE: 1/5 prox to distal. Senses gross touch and pain in left arm and leg. RUE and RLE grossly 4/5.   Psychiatric:  Flat but cooperative    Results for orders placed or performed during the hospital encounter of 02/16/18 (from the past 24 hour(s))  Glucose, capillary     Status: Abnormal   Collection Time: 02/17/18 11:50 AM  Result Value Ref Range   Glucose-Capillary 138 (H) 65 - 99 mg/dL   Comment 1 Notify RN    Comment 2 Document in Chart   Glucose, capillary     Status: Abnormal   Collection Time: 02/17/18  3:52 PM  Result Value Ref Range   Glucose-Capillary 115 (H) 65 - 99 mg/dL  Sodium     Status: None   Collection Time: 02/17/18  6:25 PM  Result Value Ref Range   Sodium 141 135 - 145 mmol/L  Glucose, capillary     Status: Abnormal   Collection Time: 02/17/18  8:09 PM  Result Value Ref Range   Glucose-Capillary 129 (H) 65 - 99 mg/dL  Glucose, capillary     Status: Abnormal   Collection Time: 02/17/18 11:22 PM  Result Value Ref Range   Glucose-Capillary 128 (H) 65 - 99 mg/dL  Sodium     Status: None   Collection Time: 02/18/18 12:15 AM  Result Value Ref Range   Sodium 140 135 - 145 mmol/L  Procalcitonin     Status: None   Collection Time: 02/18/18  5:02 AM  Result Value Ref Range   Procalcitonin 0.31 ng/mL  Phosphorus     Status: Abnormal   Collection Time: 02/18/18  5:02 AM  Result Value Ref Range   Phosphorus 6.7 (H) 2.5 - 4.6 mg/dL  Basic metabolic panel     Status: Abnormal   Collection Time: 02/18/18  5:02 AM  Result Value Ref Range   Sodium 141 135 - 145 mmol/L   Potassium 5.1 3.5 - 5.1 mmol/L   Chloride 113 (H) 101 - 111 mmol/L   CO2 17 (L) 22 - 32 mmol/L   Glucose, Bld 128 (H) 65 - 99 mg/dL   BUN 50 (H) 6 - 20 mg/dL   Creatinine, Ser 9.56 (H) 0.61 - 1.24 mg/dL   Calcium 8.2 (L) 8.9 - 10.3 mg/dL   GFR calc non Af Amer 12 (L) >60 mL/min   GFR calc Af Amer 14 (L) >60 mL/min    Anion gap 11 5 - 15  Magnesium     Status: None   Collection Time: 02/18/18  5:02 AM  Result Value Ref Range   Magnesium 2.3 1.7 - 2.4 mg/dL  CBC with Differential/Platelet     Status: Abnormal   Collection Time: 02/18/18  5:02 AM  Result Value Ref Range   WBC 11.9 (H) 4.0 - 10.5 K/uL   RBC 3.43 (L) 4.22 - 5.81 MIL/uL   Hemoglobin 8.9 (L) 13.0 - 17.0 g/dL   HCT 21.3 (L) 08.6 - 57.8 %   MCV 82.2 78.0 - 100.0 fL   MCH 25.9 (L) 26.0 - 34.0 pg   MCHC 31.6 30.0 - 36.0 g/dL   RDW 46.9 (H) 62.9 - 52.8 %   Platelets 268 150 - 400 K/uL   Neutrophils Relative % 76 %   Neutro Abs 9.0 (H) 1.7 - 7.7 K/uL   Lymphocytes Relative 13 %   Lymphs Abs 1.6 0.7 - 4.0 K/uL   Monocytes Relative 10 %   Monocytes Absolute 1.2 (H) 0.1 - 1.0 K/uL   Eosinophils Relative 1 %   Eosinophils Absolute 0.1 0.0 - 0.7 K/uL   Basophils Relative 0 %   Basophils Absolute 0.0 0.0 - 0.1 K/uL  Lipid panel     Status: Abnormal   Collection Time: 02/18/18  5:02 AM  Result Value Ref Range   Cholesterol 164 0 - 200 mg/dL   Triglycerides 99 <413 mg/dL   HDL 36 (L) >24 mg/dL   Total CHOL/HDL Ratio 4.6 RATIO   VLDL 20 0 - 40 mg/dL   LDL Cholesterol 401 (H) 0 - 99 mg/dL  Hemoglobin U2V     Status: Abnormal   Collection Time: 02/18/18  5:02 AM  Result Value Ref Range   Hgb A1c MFr Bld 6.4 (H) 4.8 - 5.6 %   Mean Plasma Glucose 136.98 mg/dL  Glucose, capillary     Status: Abnormal   Collection Time: 02/18/18  8:02 AM  Result Value Ref Range   Glucose-Capillary 102 (H) 65 - 99 mg/dL   Comment 1 Notify RN    Comment 2 Document in Chart   Urinalysis, Routine w reflex microscopic     Status: Abnormal   Collection Time: 02/18/18  8:22 AM  Result Value Ref Range   Color, Urine YELLOW YELLOW   APPearance CLOUDY (A) CLEAR   Specific Gravity, Urine 1.011 1.005 - 1.030   pH 5.0 5.0 - 8.0   Glucose, UA 50 (A) NEGATIVE mg/dL   Hgb urine dipstick LARGE (A) NEGATIVE   Bilirubin Urine  NEGATIVE NEGATIVE   Ketones, ur NEGATIVE  NEGATIVE mg/dL   Protein, ur >=161 (A) NEGATIVE mg/dL   Nitrite NEGATIVE NEGATIVE   Leukocytes, UA SMALL (A) NEGATIVE   RBC / HPF TOO NUMEROUS TO COUNT 0 - 5 RBC/hpf   WBC, UA TOO NUMEROUS TO COUNT 0 - 5 WBC/hpf   Bacteria, UA FEW (A) NONE SEEN   Squamous Epithelial / LPF 0-5 (A) NONE SEEN   WBC Clumps PRESENT    Mucus PRESENT    Dg Chest 1 View  Result Date: 02/16/2018 CLINICAL DATA:  52 year old male found down. Suspected fall. Basal ganglia cerebral hemorrhage. EXAM: CHEST  1 VIEW COMPARISON:  Chest radiographs 10/22/2017. FINDINGS: Portable AP semi upright view at 1801 hours. Lower lung volumes. Confluent retrocardiac opacity obscuring the left hemidiaphragm. Stable mild cardiomegaly. Other mediastinal contours are within normal limits. Visualized tracheal air column is within normal limits. The right lung appears clear allowing for portable technique. IMPRESSION: 1. Confluent left lower lobe opacity compatible with collapse or consolidation. 2. Right lung ventilation is within normal limits. 3. Chronic cardiomegaly. Electronically Signed   By: Odessa Fleming M.D.   On: 02/16/2018 19:14   Dg Pelvis 1-2 Views  Result Date: 02/16/2018 CLINICAL DATA:  52 year old male found down. Suspected fall. Basal ganglia cerebral hemorrhage. EXAM: PELVIS - 1-2 VIEW COMPARISON:  CT Abdomen and Pelvis 03/28/2017. FINDINGS: Femoral heads are normally located. Hip joint spaces remain normal. The proximal femurs appear intact. The pelvis appears stable and intact. Bone mineralization is within normal limits. Negative visible bowel gas pattern. IMPRESSION: No acute fracture or dislocation identified about the pelvis. Electronically Signed   By: Odessa Fleming M.D.   On: 02/16/2018 19:15   Ct Head Wo Contrast  Result Date: 02/17/2018 CLINICAL DATA:  Follow-up examination for intracranial hemorrhage. EXAM: CT HEAD WITHOUT CONTRAST TECHNIQUE: Contiguous axial images were obtained from the base of the skull through the vertex  without intravenous contrast. COMPARISON:  Prior CT from 02/16/2018. FINDINGS: Brain: Intraparenchymal hematoma centered at the right basal ganglia again seen overall not significantly changed in size measuring 28 mL in size (previously 28 mL). Surrounding low-density vasogenic edema with regional mass effect is relatively similar. Right lateral ventricle is largely effaced. Associated right-to-left shift measures up to 7 mm, little interval change. No hydrocephalus or ventricular trapping. No discernible intraventricular extension of hemorrhage. No new intracranial hemorrhage. No other acute large vessel territory infarct. Underlying atrophy with chronic small vessel ischemic disease and remote right PCA territory infarct noted. No extra-axial fluid collection. Vascular: No hyperdense vessel. Scattered vascular calcifications noted within the carotid siphons. Skull: Evolving left frontotemporal scalp contusion with overlying soft tissue edema. Calvarium intact. Sinuses/Orbits: Globes and orbital soft tissues within normal limits. Mild layering opacity within the left fronto ethmoidal sinus. Paranasal sinuses are otherwise clear. Nasogastric tube in place. No mastoid effusion. Other: None. IMPRESSION: 1. No significant interval change in size and appearance of right basal ganglia intraparenchymal hemorrhage, estimated volume 28 mL. Regional mass effect with localized 7 mm right-to-left shift relatively stable. No hydrocephalus or ventricular trapping. 2. Evolving left frontotemporal scalp contusion. 3. No other acute intracranial abnormality. 4. Underlying atrophy with chronic small vessel ischemic disease and remote right PCA territory infarct. Electronically Signed   By: Rise Mu M.D.   On: 02/17/2018 01:37   Ct Head Wo Contrast  Addendum Date: 02/16/2018   ADDENDUM REPORT: 02/16/2018 18:43 ADDENDUM: Critical Value/emergent results were called by telephone at the time of interpretation on 02/16/2018  at 1824 hours. to Dr. Chaney Malling , who verbally acknowledged these results. Electronically Signed   By: Odessa Fleming M.D.   On: 02/16/2018 18:43   Result Date: 02/16/2018 CLINICAL DATA:  52 year old male found down. EXAM: CT HEAD WITHOUT CONTRAST CT CERVICAL SPINE WITHOUT CONTRAST TECHNIQUE: Multidetector CT imaging of the head and cervical spine was performed following the standard protocol without intravenous contrast. Multiplanar CT image reconstructions of the cervical spine were also generated. COMPARISON:  None. FINDINGS: Study is mildly degraded by motion artifact despite repeated imaging attempts. CT HEAD FINDINGS Brain: Oval hyperdense intra-axial hemorrhage centered at the right basal ganglia encompassing 57 x 30 x 33 millimeters (AP by transverse by CC) for an estimated blood volume of 28 milliliters. Surrounding edema. Mass effect with leftward midline shift of 8-9 millimeters. Effaced right lateral ventricle, but no intraventricular hemorrhage. No ventriculomegaly. No other acute intracranial hemorrhage identified. The basilar cisterns are normal. Chronic appearing encephalomalacia in the right occipital pole. Confluent bilateral cerebral white matter hypodensity. Vascular: Degraded by motion, No suspicious intracranial vascular hyperdensity. Skull: Bone detail limited by motion, no acute osseous abnormality identified. Sinuses/Orbits: Clear. Other: Large generalized left side scalp and face superficial soft tissue hematoma measuring up to 11 millimeters in thickness. The forehead is affected. The findings continue along the lateral and anterior left orbit. Grossly negative orbits soft tissues, degraded by motion. The right scalp appears relatively spared. CT CERVICAL SPINE FINDINGS Alignment: Straightening and mild reversal of cervical lordosis. Cervicothoracic junction alignment is within normal limits. Bilateral posterior element alignment is within normal limits. Skull base and vertebrae: Lower cervical  spine bone detail is decreased by large body habitus. Visualized skull base is intact. No atlanto-occipital dissociation. No cervical spine fracture identified. Soft tissues and spinal canal: There is a retropharyngeal course of both carotid arteries in the neck. Grossly negative noncontrast deep paraspinal soft tissues. Disc levels: There is broad-based right foraminal disc osteophyte degeneration at C3-C4 with severe right C4 foraminal stenosis. No other significant cervical spine degeneration is evident. Upper chest: The visible T1 level appears intact. IMPRESSION: 1. Acute right basal ganglia hemorrhage with estimated blood volume of 28 mL. Underlying chronic cerebral ischemia, including previous right PCA infarct. 2. Surrounding edema with regional mass effect including leftward midline shift up to 9 mm. Basilar cisterns are patent. 3. Subtotal effacement of the right lateral ventricle, but no intraventricular extension of blood and no ventriculomegaly. 4. Large left scalp hematoma. No underlying skull fracture identified. 5.  No acute fracture in the cervical spine. Electronically Signed: By: Odessa Fleming M.D. On: 02/16/2018 18:23   Ct Cervical Spine Wo Contrast  Addendum Date: 02/16/2018   ADDENDUM REPORT: 02/16/2018 18:43 ADDENDUM: Critical Value/emergent results were called by telephone at the time of interpretation on 02/16/2018 at 1824 hours. to Dr. Chaney Malling , who verbally acknowledged these results. Electronically Signed   By: Odessa Fleming M.D.   On: 02/16/2018 18:43   Result Date: 02/16/2018 CLINICAL DATA:  52 year old male found down. EXAM: CT HEAD WITHOUT CONTRAST CT CERVICAL SPINE WITHOUT CONTRAST TECHNIQUE: Multidetector CT imaging of the head and cervical spine was performed following the standard protocol without intravenous contrast. Multiplanar CT image reconstructions of the cervical spine were also generated. COMPARISON:  None. FINDINGS: Study is mildly degraded by motion artifact despite repeated  imaging attempts. CT HEAD FINDINGS Brain: Oval hyperdense intra-axial hemorrhage centered at the right basal ganglia encompassing 57 x 30 x 33 millimeters (AP by transverse by CC) for an  estimated blood volume of 28 milliliters. Surrounding edema. Mass effect with leftward midline shift of 8-9 millimeters. Effaced right lateral ventricle, but no intraventricular hemorrhage. No ventriculomegaly. No other acute intracranial hemorrhage identified. The basilar cisterns are normal. Chronic appearing encephalomalacia in the right occipital pole. Confluent bilateral cerebral white matter hypodensity. Vascular: Degraded by motion, No suspicious intracranial vascular hyperdensity. Skull: Bone detail limited by motion, no acute osseous abnormality identified. Sinuses/Orbits: Clear. Other: Large generalized left side scalp and face superficial soft tissue hematoma measuring up to 11 millimeters in thickness. The forehead is affected. The findings continue along the lateral and anterior left orbit. Grossly negative orbits soft tissues, degraded by motion. The right scalp appears relatively spared. CT CERVICAL SPINE FINDINGS Alignment: Straightening and mild reversal of cervical lordosis. Cervicothoracic junction alignment is within normal limits. Bilateral posterior element alignment is within normal limits. Skull base and vertebrae: Lower cervical spine bone detail is decreased by large body habitus. Visualized skull base is intact. No atlanto-occipital dissociation. No cervical spine fracture identified. Soft tissues and spinal canal: There is a retropharyngeal course of both carotid arteries in the neck. Grossly negative noncontrast deep paraspinal soft tissues. Disc levels: There is broad-based right foraminal disc osteophyte degeneration at C3-C4 with severe right C4 foraminal stenosis. No other significant cervical spine degeneration is evident. Upper chest: The visible T1 level appears intact. IMPRESSION: 1. Acute right  basal ganglia hemorrhage with estimated blood volume of 28 mL. Underlying chronic cerebral ischemia, including previous right PCA infarct. 2. Surrounding edema with regional mass effect including leftward midline shift up to 9 mm. Basilar cisterns are patent. 3. Subtotal effacement of the right lateral ventricle, but no intraventricular extension of blood and no ventriculomegaly. 4. Large left scalp hematoma. No underlying skull fracture identified. 5.  No acute fracture in the cervical spine. Electronically Signed: By: Odessa Fleming M.D. On: 02/16/2018 18:23   US Renal  Result Date: 02/18/2018 CLINICAL DATA:  52 year old hypertensive diabetic male with chronic kidney disease. Initial encounter. EXAM: RENAL / URINARY TRACT ULTRASOUND COMPLETE COMPARISON:  12/16/2017 renal sonogram. FINDINGS: Right Kidney: Length: 14.3 cm. Echogenicity within normal limits. No mass or hydronephrosis visualized. Left Kidney: Length: 14 cm. Echogenicity within normal limits. No hydronephrosis. Left lower pole 9.8 mm nonobstructing stone. Bladder: Decompressed by Foley catheter and therefore not evaluated. IMPRESSION: No hydronephrosis. Left lower pole 9.8 mm nonobstructing stone. Electronically Signed   By: Lacy Duverney M.D.   On: 02/18/2018 10:00   Dg Chest Port 1 View  Result Date: 02/16/2018 CLINICAL DATA:  Central catheter placement EXAM: PORTABLE CHEST 1 VIEW COMPARISON:  Study obtained earlier in the day FINDINGS: Central catheter tip is in the superior vena cava. No pneumothorax. There is no appreciable edema or consolidation. There is stable cardiomegaly. Pulmonary vascular is normal. No adenopathy. No bone lesions. IMPRESSION: Central catheter tip in superior vena cava. No pneumothorax. Stable cardiomegaly. No edema or consolidation. Electronically Signed   By: Bretta Bang III M.D.   On: 02/16/2018 23:42   Dg Chest Port 1 View  Result Date: 02/16/2018 CLINICAL DATA:  Acute respiratory failure with hypoxia. EXAM:  PORTABLE CHEST 1 VIEW COMPARISON:  Radiograph earlier this day. FINDINGS: Unchanged cardiomegaly and mediastinal contours. Unchanged retrocardiac opacity. Mild bronchial thickening. No new focal airspace disease. No pneumothorax. IMPRESSION: 1. Unchanged retrocardiac opacity which may be combination of pleural fluid and atelectasis or airspace disease. 2. Unchanged cardiomegaly. 3. Mild central bronchial thickening. Electronically Signed   By: Lujean Rave.D.  On: 02/16/2018 22:00   Dg Abd Portable 1v  Result Date: 02/17/2018 CLINICAL DATA:  NG tube advancement. EXAM: PORTABLE ABDOMEN - 1 VIEW COMPARISON:  2 hours prior. FINDINGS: The enteric tube has been advanced with tip and side-port below the diaphragm in the stomach. No bowel dilatation in the visualized upper abdomen. IMPRESSION: Tip and side port of the enteric tube below the diaphragm in the stomach. Electronically Signed   By: Rubye Oaks M.D.   On: 02/17/2018 03:37   Dg Abd Portable 1v  Result Date: 02/17/2018 CLINICAL DATA:  NG tube advancement. EXAM: PORTABLE ABDOMEN - 1 VIEW COMPARISON:  1 hour prior. FINDINGS: Minimal advancement of the enteric tube, tip in the region of the distal esophagus. Advancement of an additional 10 cm is recommended to place the side-port below the diaphragm. IMPRESSION: Tip of the enteric tube remains in the region of the distal esophagus, recommend advancement of an additional 10 cm to place the side-port below the diaphragm. Electronically Signed   By: Rubye Oaks M.D.   On: 02/17/2018 02:08   Dg Abd Portable 1v  Result Date: 02/17/2018 CLINICAL DATA:  NG tube placement. EXAM: PORTABLE ABDOMEN - 1 VIEW COMPARISON:  None. FINDINGS: Tip of the enteric tube in the distal esophagus, recommend advancement of at least 10 cm to place the side-port below the diaphragm. Cardiomegaly is partially included. IMPRESSION: Tip of the enteric tube in the distal esophagus, recommend advancement of least 10 cm  to place the side-port below the diaphragm. Electronically Signed   By: Rubye Oaks M.D.   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  1. Does the need for close, 24 hr/day medical supervision in concert with the patient's rehab needs make it unreasonable for this patient to be served in a less intensive setting? Yes 2. Co-Morbidities requiring supervision/potential complications: CKD, htn, morbid obesity, LE edema 3. Due to bladder management, bowel management, safety, skin/wound care, disease management, medication administration, pain management and patient education, does the patient require 24 hr/day rehab nursing? Yes 4. Does the patient require coordinated care of a physician, rehab nurse, PT (1-2 hrs/day, 5 days/week), OT (1-2 hrs/day, 5 days/week) and SLP (1-2 hrs/day, 5 days/week) to address physical and functional deficits in the context of the above medical diagnosis(es)? Yes Addressing deficits in the following areas: balance, endurance, locomotion, strength, transferring, bowel/bladder control, bathing, dressing, feeding, grooming, toileting, cognition, speech, swallowing and psychosocial support 5. Can the patient actively participate in an intensive therapy program of at least 3 hrs of therapy per day at least 5 days per week? Yes 6. The potential for patient to make measurable gains while on inpatient rehab is excellent 7. Anticipated functional outcomes upon discharge from inpatient rehab are supervision and min assist  with PT, supervision and min assist with OT, modified independent and supervision with SLP. 8. Estimated rehab length of stay to reach the above functional goals is: 20-27 days 9. Anticipated D/C setting: Home 10. Anticipated post D/C treatments: HH therapy and Outpatient therapy 11. Overall Rehab/Functional Prognosis: excellent  RECOMMENDATIONS: This patient's condition is  appropriate for continued rehabilitative care in the following setting: CIR Patient has agreed to participate in recommended program. Yes Note that insurance prior authorization may be required for reimbursement for recommended care.  Comment: Rehab Admissions Coordinator to follow up.  Thanks,  Ranelle Oyster, MD, [place] [facility]     Jacquelynn Cree, PA-C 02/18/2018

## 2018-02-18 NOTE — Care Management Note (Signed)
Case Management Note  Patient Details  Name: Loleta ChanceReginald F Henningsen MRN: [id number] Date of Birth: [birth year]  Subjective/Objective:   Pt presents 02/16/18 with an acute ICH located in the right basal ganglia with midline shift and surrounding edema.  PTA, pt independent, lives at home with spouse.              Action/Plan: PT/OT recommending CIR and rehab consult in progress.  Will follow.   Expected Discharge Date:                  Expected Discharge Plan:  IP Rehab Facility  In-House Referral:     Discharge planning Services  CM Consult  Post Acute Care Choice:    Choice offered to:     DME Arranged:    DME Agency:     HH Arranged:    HH Agency:     Status of Service:  In process, will continue to follow  If discussed at Long Length of Stay Meetings, dates discussed:    Additional Comments:  Glennon Macmerson, Navina Wohlers M, RN 02/18/2018, 4:52 PM

## 2018-02-18 NOTE — Progress Notes (Signed)
PULMONARY / CRITICAL CARE MEDICINE   Name: [name] [name] MRN: [id number] DOB: 02-06-1966    ADMISSION DATE:  02/16/2018  CHIEF COMPLAINT:  ICH  HISTORY OF PRESENT ILLNESS:        52 year old male with CKD stage IV, Uncontrolled HTN, DM, Diastolic Heart Failure (EF 50-55)       Presents to ED on 3/17 after being found on floor laying on the left side at 6 pm 3/16. On floor for estimated 18 hours. Presented to ED with headache, nausea, and left sided weakness with slurred speech. BP on arrival 245/132. CT head with acute right basal ganglia hemorrhage with midline shift about 9 mm. Transferred to [facility] for further care. Arrived on Cleviprex with systolic in the 200s. PCCM asked to consult.       This is post bleed day 2.  Follow-up CT scan at 24 hours did not show expansion of the hematoma.  He is still requiring moderate to high-dose Cleviprex for blood pressure control however it should be noted that he is not able to take p.o.'s at this time and he is not receiving his usual p.o. antihypertensives.  He is awake this morning alert but responding primarily in monosyllabic's.  He continues to receive hypertonic saline. PAST MEDICAL HISTORY :  He  has a past medical history of Cellulitis and abscess of leg (01/08/2014), Diabetes mellitus, Diverticulitis, and Hypertension.  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  Allergies  Allergen Reactions  . Minocycline Anaphylaxis    SOB, itching, hives.  . Shellfish Allergy Nausea And Vomiting    No current facility-administered medications on file prior to encounter.    Current Outpatient Medications on File Prior to Encounter  Medication Sig  . amLODipine (NORVASC) 10 MG tablet Take 1 tablet (10 mg total) by mouth daily.  . furosemide (LASIX) 20 MG tablet Take 40 mg by mouth daily.   . Insulin Detemir (LEVEMIR)  100 UNIT/ML Pen Inject 22 Units into the skin daily at 10 pm.  . labetalol (NORMODYNE) 200 MG tablet Take 200 mg by mouth 2 (two) times daily.   Marland Kitchen LORazepam (ATIVAN) 0.5 MG tablet Take 0.5 mg by mouth 2 (two) times daily as needed for anxiety.  . tamsulosin (FLOMAX) 0.4 MG CAPS capsule Take one capsule daily until stone passes. (Patient not taking: Reported on 02/16/2018)    FAMILY HISTORY:  His indicated that his mother is alive. He indicated that his father is alive. He indicated that his sister is alive.   SOCIAL HISTORY: He  reports that  has never smoked. he has never used smokeless tobacco. He reports that he does not drink alcohol or use drugs.  REVIEW OF SYSTEMS:   Not obtainable SUBJECTIVE:  As above  VITAL SIGNS: BP (!) 146/68   Pulse 71   Temp 98.9 F (37.2 C) (Oral)   Resp 18   Ht 6\' 2"  (1.88 m)   Wt (!) 387 lb 9.1 oz (175.8 kg)   SpO2 98%   BMI 49.76 kg/m   HEMODYNAMICS:    VENTILATOR SETTINGS:    INTAKE / OUTPUT: I/O last 3 completed shifts: In: 2990.2 [I.V.:2990.2] Out: 635 [Urine:635]  PHYSICAL EXAMINATION: General: Resting comfortably in bed.   Neuro: Pupils are equal, EOMs appear to be full, and the face is symmetric.  He is purposeful with the right upper extremity and moves the right  lower extremity on request.  He also has some movement in the left lower extremity on request but is unable to move the left upper extremity. Cardiovascular: S1 and S2 are regular without murmur rub or gallop Lungs: Respirations are unlabored, there is symmetric air movement, no wheezes Abdomen: The abdomen is grossly obese without any obvious organomegaly masses tenderness guarding or rebound Musculoskeletal: Right toes are surgically absent.  There is no dependent edema.   LABS:  BMET Recent Labs  Lab 02/16/18 2220 02/17/18 0459 02/17/18 1825 02/18/18 0015 02/18/18 0502  NA 138 138 141 140 141  K 4.6 5.0  --   --  5.1  CL 113* 112*  --   --  113*  CO2  17* 18*  --   --  17*  BUN 39* 43*  --   --  50*  CREATININE 3.64* 4.09*  --   --  5.01*  GLUCOSE 161* 186*  --   --  128*    Electrolytes Recent Labs  Lab 02/16/18 2220 02/17/18 0459 02/18/18 0502  CALCIUM 8.4* 8.5* 8.2*  MG  --  2.2 2.3  PHOS  --  6.0* 6.7*    CBC Recent Labs  Lab 02/16/18 1714 02/16/18 1721 02/17/18 0459 02/18/18 0502  WBC 8.6  --  11.1* 11.9*  HGB 10.5* 11.6* 9.2* 8.9*  HCT 33.2* 34.0* 29.4* 28.2*  PLT 331  --  275 268    Coag's No results for input(s): APTT, INR in the last 168 hours.  Sepsis Markers Recent Labs  Lab 02/16/18 2220 02/17/18 0459 02/18/18 0502  PROCALCITON 0.15 0.22 0.31    ABG No results for input(s): PHART, PCO2ART, PO2ART in the last 168 hours.  Liver Enzymes Recent Labs  Lab 02/16/18 1714  AST 17  ALT 20  ALKPHOS 93  BILITOT 1.0  ALBUMIN 3.0*    Cardiac Enzymes No results for input(s): TROPONINI, PROBNP in the last 168 hours.  Glucose Recent Labs  Lab 02/17/18 0747 02/17/18 1150 02/17/18 1552 02/17/18 2009 02/17/18 2322 02/18/18 0802  GLUCAP 153* 138* 115* 129* 128* 102*    Imaging No results found.   STUDIES:  Repeat CT early this morning did not show progression of his basal ganglia hemorrhage   DISCUSSION:      This is a 52 year old hypertensive with a history of chronic renal disease and diastolic congestive heart failure as well as diabetes who slumped to the floor and was found to have a right basal ganglia hemorrhage.  ASSESSMENT / PLAN:  PULMONARY A: No active issues, there is no difficulty in maintaining his airway  CARDIOVASCULAR A: No indication of CHF on exam despite history of diastolic dysfunction.  He continues on Cardene for blood pressure control.  I have added topical clonidine and hopefully will again have an enteral route for administration of antihypertensives in the near future.    RENAL A: I am concerned by his very marginal renal function.  I have ordered a UA  and renal ultrasound as baseline studies for nephrology who I am anticipating we will need to consult should his renal function deteriorate further.  If he is able to take p.o.'s I would like to start him on a small dose of p.o. Bicarbonate as he has a baseline metabolic acidosis related to his renal failure which will only be exacerbated by the administration of 3% saline.  We obviously continue to avoid any nephrotoxic's or hemodynamic instability.  GASTROINTESTINAL A: GI prophylaxis is with Protonix  HEMATOLOGIC A: No chemical DVT prophylaxis due to intracranial hemorrhage  INFECTIOUS A: Currently no active issues   ENDOCRINE A: Sliding scale insulin is currently in place for diabetic control     NEUROLOGIC A:   This is day 2 status post right basal ganglia hemorrhage.  Nicardipine is being employed for blood pressure control.  He continues on 3% saline for edema surrounding the hematoma.  Penny PiaWJ Gray, MD Pulmonary and Critical Care Medicine [facility] Pager: [phone]  02/18/2018, 8:27 AM

## 2018-02-18 NOTE — Progress Notes (Signed)
STROKE TEAM PROGRESS NOTE   HISTORY OF PRESENT ILLNESS (per record) [name] [name] is an 52 y.o. male  AA RH with PMH of uncontrolled HTN, CKD, insulin-dependent T2DM presented to [facility] long ER .  Pt reports around 6PM last night, he was sitting ina desk chair and dropped his phone and slipped to the floor. His family tried to get him up but he refused syainghe wsa fine and would get up. He was still in the floor today and so the called EMS.Now he endorses mild headacheand mild nausea. BP was >200 systolic on arrival. He was started on Cerdene gtt at [place] and long and later switched to Kleveprix drip - but still not under control on arrival [facility].    Date last known well: 3.16.19 Time last known well: 6pm tPA Given: no, ICH NIHSS: 17 Baseline MRS 0     SUBJECTIVE (INTERVAL HISTORY) No family members present.  The patient is fairly alert.  He is very dysarthric.His blood pressure is controlled on Cardene drip. He is on hypertonic saline but serum sodium is yet not optimal. he has not yet passed a swallow eval..    OBJECTIVE Temp:  [97.5 F (36.4 C)-99.2 F (37.3 C)] 97.5 F (36.4 C) (03/19 1200) Pulse Rate:  [62-74] 70 (03/19 1230) Cardiac Rhythm: Normal sinus rhythm (03/19 0400) Resp:  [16-29] 19 (03/19 1230) BP: (128-164)/(58-81) 136/63 (03/19 1230) SpO2:  [89 %-100 %] 96 % (03/19 1230) Arterial Line BP: (143-196)/(58-89) 193/82 (03/19 1030)  CBC:  Recent Labs  Lab 02/16/18 1714  02/17/18 0459 02/18/18 0502  WBC 8.6  --  11.1* 11.9*  NEUTROABS 6.9  --   --  9.0*  HGB 10.5*   < > 9.2* 8.9*  HCT 33.2*   < > 29.4* 28.2*  MCV 84.1  --  82.8 82.2  PLT 331  --  275 268   < > = values in this interval not displayed.    Basic Metabolic Panel:  Recent Labs  Lab 02/17/18 0459  02/18/18 0502 02/18/18 1050  NA 138   < > 141 144  K 5.0  --  5.1  --   CL 112*  --  113*  --   CO2 18*  --  17*  --   GLUCOSE 186*  --  128*  --   BUN 43*  --  50*  --    CREATININE 4.09*  --  5.01*  --   CALCIUM 8.5*  --  8.2*  --   MG 2.2  --  2.3  --   PHOS 6.0*  --  6.7*  --    < > = values in this interval not displayed.    Lipid Panel:     Component Value Date/Time   CHOL 164 02/18/2018 0502   TRIG 99 02/18/2018 0502   HDL 36 (L) 02/18/2018 0502   CHOLHDL 4.6 02/18/2018 0502   VLDL 20 02/18/2018 0502   LDLCALC 108 (H) 02/18/2018 0502   HgbA1c:  Lab Results  Component Value Date   HGBA1C 6.4 (H) 02/18/2018   Urine Drug Screen:     Component Value Date/Time   LABOPIA NONE DETECTED 02/16/2018 1842   COCAINSCRNUR NONE DETECTED 02/16/2018 1842   LABBENZ NONE DETECTED 02/16/2018 1842   AMPHETMU NONE DETECTED 02/16/2018 1842   THCU NONE DETECTED 02/16/2018 1842   LABBARB NONE DETECTED 02/16/2018 1842    Alcohol Level     Component Value Date/Time   ETH <10 02/16/2018  2220    IMAGING  Dg Chest 1 View 02/16/2018 IMPRESSION:  1. Confluent left lower lobe opacity compatible with collapse or consolidation.  2. Right lung ventilation is within normal limits.  3. Chronic cardiomegaly.    Dg Pelvis 1-2 Views 02/16/2018 IMPRESSION:  No acute fracture or dislocation identified about the pelvis.    Ct Head Wo Contrast 02/17/2018 IMPRESSION:  1. No significant interval change in size and appearance of right basal ganglia intraparenchymal hemorrhage, estimated volume 28 mL. Regional mass effect with localized 7 mm right-to-left shift relatively stable. No hydrocephalus or ventricular trapping.  2. Evolving left frontotemporal scalp contusion.  3. No other acute intracranial abnormality.  4. Underlying atrophy with chronic small vessel ischemic disease and remote right PCA territory infarct.    Ct Head Wo Contrast 02/16/2018   IMPRESSION:  1. Acute right basal ganglia hemorrhage with estimated blood volume of 28 mL. Underlying chronic cerebral ischemia, including previous right PCA infarct.  2. Surrounding edema with regional mass  effect including leftward midline shift up to 9 mm. Basilar cisterns are patent.  3. Subtotal effacement of the right lateral ventricle, but no intraventricular extension of blood and no ventriculomegaly.  4. Large left scalp hematoma. No underlying skull fracture identified.   Ct Cervical Spine Wo Contrast 02/16/2018   No acute fracture in the cervical spine.    Dg Chest Port 1 View 02/16/2018 IMPRESSION:  Central catheter tip in superior vena cava. No pneumothorax. Stable cardiomegaly. No edema or consolidation.    Dg Chest Port 1 View 02/16/2018 IMPRESSION:  1. Unchanged retrocardiac opacity which may be combination of pleural fluid and atelectasis or airspace disease.  2. Unchanged cardiomegaly.  3. Mild central bronchial thickening.    Dg Abd Portable 1v\ 02/17/2018 IMPRESSION:  Tip and side port of the enteric tube below the diaphragm in the stomach.    Dg Abd Portable 1v 02/17/2018 IMPRESSION:  Tip of the enteric tube remains in the region of the distal esophagus, recommend advancement of an additional 10 cm to place the side-port below the diaphragm.    Dg Abd Portable 1v 02/17/2018 IMPRESSION:  Tip of the enteric tube in the distal esophagus, recommend advancement of least 10 cm to place the side-port below the diaphragm.    Transthoracic Echocardiogram - Left ventricle: The cavity size was mildly dilated. There was   severe concentric hypertrophy. Systolic function was mildly   reduced. The estimated ejection fraction was in the range of 45%   to 50%. Diffuse hypokinesis    Bilateral Carotid Dopplers - no significant bilateral carotid stenosis.       PHYSICAL EXAM Vitals:   02/18/18 1100 02/18/18 1130 02/18/18 1200 02/18/18 1230  BP: (!) 148/62  (!) 129/58 136/63  Pulse: 71 71 70 70  Resp: 18 20 19 19   Temp:   (!) 97.5 F (36.4 C)   TempSrc:   Oral   SpO2: 98% 95% (!) 89% 96%  Weight:      Height:       Obese middle-aged African-American male  currently not in distress. . Afebrile. Head is nontraumatic. Neck is supple without bruit.    Cardiac exam no murmur or gallop. Lungs are clear to auscultation. Distal pulses are well felt. Neurological Exam :  Awake alert oriented 2. Dysarthria but can be easily understood. Right gaze preference but can look to the left and midline. Blinks to threat on the right but not the left. Left lower facial weakness. Tongue midline. Normal  strength on the right side. Left dense hemiplegia with only trace toe flexion on the left leg. Withdraws left leg. Trace withdrawal in the left upper extremity only. Tone is diminished on the left normal on the right. Deep tendon reflexes absent on the left and present on the right. Left plantar upgoing right downgoing. Gait not tested.  ASSESSMENT/PLAN [name] [name] is a 52 y.o. male with history of hypertension, chronic kidney disease, diabetes, and right transmetatarsal amputation presenting with headache, nausea, and elevated blood pressure. He did not receive IV t-PA due to ICH.  Right basal ganglia intraparenchymal hemorrhage likely secondary to hypertension.  Resultant  Right gaze preference and left hemiplegia  CT head - Acute right basal ganglia hemorrhage with estimated blood volume of 28 mL.  MRI head - not performed  MRA head - not performed  Carotid Doppler - no significant bilateral carotid stenosis.  2D Echo - Decreased EF 45-50%. Diffuse hypokinesis.  LDL - 108 mg percent  HgbA1c - 6.4%  VTE prophylaxis - SCDs Fall precautions DIET - DYS 1 Room service appropriate? Yes; Fluid consistency: Nectar Thick  No antithrombotic prior to admission, now on No antithrombotic  Ongoing aggressive stroke risk factor management  Therapy recommendations:  CLR Disposition: rehabilitation Hypertension  Stable  Systolic blood pressure goal 160-170 at this time  Long-term BP goal normotensive  Hyperlipidemia  Lipid lowering medication PTA:   none  LDL 6.4%, goal < 70  Current lipid lowering medication: none  Await LDL -start statin at discharge if indicated.  Diabetes  HgbA1c pending, goal < 7.0  Unc / Controlled  Other Stroke Risk Factors  Obesity, Body mass index is 49.76 kg/m., recommend weight loss, diet and exercise as appropriate   Previous right PCA infarct by imaging.   Cerebral Edema  CT  Today - 7 mm right-to-left shift relatively stable - improved from 9 mm yesterday. Start hypertonic saline with sodium goal 150-155  Other Active Problems  Chronic kidney disease -gentle hydration  Anemia - likely chronic  Mild leukocytosis - afebrile -Decadron yesterday  NPO - meds per tube  CXR yesterday -  Confluent LLL opacity compatible with collapse or consolidation.    Plan / Recommendations   increase hypertonic saline to 85 mL an hour with sodium goal of 150-155  Resume home blood pressure medications -Norvasc 10 mg QD Labetalol 200 mg BID  Mobilize out of bed. Therapy and rehabilitation consults.  Patient able to repeat swallow eval today   Hospital day # 2    I have personally examined this patient, reviewed notes, independently viewed imaging studies, participated in medical decision making and plan of care.ROS completed by me personally and pertinent positives fully documented  I have made any additions or clarifications directly to the above note . He presented with right basal ganglia hemorrhage secondary to uncontrolled hypertension. Follow-up imaging shows stable appearance but there is 7 mm right-to-left shift with cytotoxic edema. Recommend strict blood pressure control and close neurological monitoring.continue hypertonic saline with sodium goal 150-155.and increase drip rate to 85 ML per hour No family is available at the bedside for discussion. This patient is critically ill and at significant risk of neurological worsening, death and care requires constant monitoring of vital signs,  hemodynamics,respiratory and cardiac monitoring, extensive review of multiple databases, frequent neurological assessment, discussion with family, other specialists and medical decision making of high complexity.I have made any additions or clarifications directly to the above note.This critical care time does not reflect  procedure time, or teaching time or supervisory time of PA/NP/Med Resident etc but could involve care discussion time.No family available at the bedside for discussion  I spent 32 minutes of neurocritical care time  in the care of  this patient.     Delia HeadyPramod Sethi, MD Medical Director [facility] Pager: [phone] 02/18/2018 1:29 PM   To contact Stroke Continuity provider, please refer to [email or web address]. After hours, contact General Neurology

## 2018-02-18 NOTE — Progress Notes (Signed)
Inpatient Rehabilitation  Per PT/OT request, patient was re-screened by Fae PippinMelissa Deundre Thong for appropriateness for an Inpatient Acute Rehab consult.  At this time we are recommending an Inpatient Rehab consult.  Please order if you are agreeable.    Charlane FerrettiMelissa Jordann Grime, M.A., CCC/SLP Admission Coordinator  [facility]  Cell [phone]

## 2018-02-18 NOTE — Progress Notes (Signed)
  Speech Language Pathology Treatment: Dysphagia  Patient Details Name: [name] [name] MRN: [id number] DOB: January 08, 1966 Today's Date: 02/18/2018 Time: [id number] SLP Time Calculation (min) (ACUTE ONLY): 8 min  Assessment / Plan / Recommendation Clinical Impression  Pt has improved arousal today, requiring no cues from SLP to maintain alertness. RN reports that he is intermittently sleepy, but that he has had adequate arousal most of the time since FEES was completed and that he has taken sips of nectar thick liquids without overt difficulty. Pt consumed 4 oz of nectar thick liquids and 2 oz of applesauce with one throat clear noted. SLP provided Min cues for smaller, single sips with no further concerns for reduced airway protection. Recommend initiating Dys 1 diet and nectar thick liquids by cup as recommended during FEES on previous date.    HPI HPI: Pt reports around 6PM last night, he was sitting ina desk chair and dropped his phoneand slipped to the floor. His family tried to get him up but he refused syainghe wsa fine and would get up. He was still in the floor today and so the called EMS.Now he endorses mild headacheand mild nausea.BP was >200 systolic on arrival. He was [place] Cerdene gtt at [place] [facility] and later switched to Kleveprix drip - but still not under control on [facility].CT confirmed Right basal ganglia hemorrhage.       SLP Plan  Continue with current plan of care       Recommendations  Diet recommendations: Dysphagia 1 (puree);Nectar-thick liquid Liquids provided via: Cup;Teaspoon;No straw Medication Administration: Crushed with puree Supervision: Patient able to self feed;Full supervision/cueing for compensatory strategies Compensations: Minimize environmental distractions;Slow rate;Small sips/bites Postural Changes and/or Swallow Maneuvers: Seated upright 90 degrees                Oral Care Recommendations: Oral care BID Follow up  Recommendations: Inpatient Rehab SLP Visit Diagnosis: Dysphagia, oropharyngeal phase (R13.12) Plan: Continue with current plan of care       GO                [name] [name], [name] [name] 02/18/2018, 10:37 AM  [name] [name], M.A. CCC-SLP [phone]

## 2018-02-19 LAB — BASIC METABOLIC PANEL
ANION GAP: 8 (ref 5–15)
BUN: 51 mg/dL — ABNORMAL HIGH (ref 6–20)
CO2: 18 mmol/L — ABNORMAL LOW (ref 22–32)
Calcium: 8.1 mg/dL — ABNORMAL LOW (ref 8.9–10.3)
Chloride: 123 mmol/L — ABNORMAL HIGH (ref 101–111)
Creatinine, Ser: 5.26 mg/dL — ABNORMAL HIGH (ref 0.61–1.24)
GFR calc non Af Amer: 12 mL/min — ABNORMAL LOW (ref >60)
GFR, EST AFRICAN AMERICAN: 13 mL/min — AB (ref >60)
Glucose, Bld: 123 mg/dL — ABNORMAL HIGH (ref 65–99)
POTASSIUM: 5.1 mmol/L (ref 3.5–5.1)
Sodium: 149 mmol/L — ABNORMAL HIGH (ref 135–145)

## 2018-02-19 LAB — GLUCOSE, CAPILLARY
GLUCOSE-CAPILLARY: 116 mg/dL — AB (ref 65–99)
GLUCOSE-CAPILLARY: 133 mg/dL — AB (ref 65–99)
Glucose-Capillary: 106 mg/dL — ABNORMAL HIGH (ref 65–99)
Glucose-Capillary: 174 mg/dL — ABNORMAL HIGH (ref 65–99)
Glucose-Capillary: 147 mg/dL — ABNORMAL HIGH (ref 65–99)
Glucose-Capillary: 152 mg/dL — ABNORMAL HIGH (ref 65–99)

## 2018-02-19 LAB — PHOSPHORUS: Phosphorus: 6.8 mg/dL — ABNORMAL HIGH (ref 2.5–4.6)

## 2018-02-19 LAB — SODIUM
SODIUM: 150 mmol/L — AB (ref 135–145)
SODIUM: 149 mmol/L — AB (ref 135–145)
Sodium: 149 mmol/L — ABNORMAL HIGH (ref 135–145)

## 2018-02-19 LAB — CBC WITH DIFFERENTIAL/PLATELET
Basophils Absolute: 0 10*3/uL (ref 0.0–0.1)
Basophils Relative: 0 %
EOS ABS: 0.2 10*3/uL (ref 0.0–0.7)
Eosinophils Relative: 2 %
HCT: 28.2 % — ABNORMAL LOW (ref 39.0–52.0)
HEMOGLOBIN: 8.7 g/dL — AB (ref 13.0–17.0)
LYMPHS ABS: 1.5 10*3/uL (ref 0.7–4.0)
LYMPHS PCT: 21 %
MCH: 26.4 pg (ref 26.0–34.0)
MCHC: 30.9 g/dL (ref 30.0–36.0)
MCV: 85.5 fL (ref 78.0–100.0)
Monocytes Absolute: 0.7 10*3/uL (ref 0.1–1.0)
Monocytes Relative: 10 %
NEUTROS PCT: 67 %
Neutro Abs: 4.9 10*3/uL (ref 1.7–7.7)
Platelets: 237 10*3/uL (ref 150–400)
RBC: 3.3 MIL/uL — AB (ref 4.22–5.81)
RDW: 17 % — ABNORMAL HIGH (ref 11.5–15.5)
WBC: 7.3 10*3/uL (ref 4.0–10.5)

## 2018-02-19 MED ORDER — HYDRALAZINE HCL 20 MG/ML IJ SOLN
20.0000 mg | Freq: Three times a day (TID) | INTRAMUSCULAR | Status: DC | PRN
  Administered 2018-02-19 – 2018-02-22 (×6): 20 mg via INTRAVENOUS
  Filled 2018-02-19 (×7): qty 1

## 2018-02-19 MED ORDER — CLONIDINE HCL 0.1 MG PO TABS
0.2000 mg | ORAL_TABLET | Freq: Three times a day (TID) | ORAL | Status: DC
  Administered 2018-02-19 – 2018-02-21 (×9): 0.2 mg via ORAL
  Filled 2018-02-19 (×9): qty 2

## 2018-02-19 NOTE — [place] FL2 (Addendum)
[place] MEDICAID FL2 LEVEL OF CARE SCREENING TOOL     IDENTIFICATION  Patient Name: [name] [name] Birthdate: [birth year] Sex: male Admission Date (Current Location): 02/16/2018  [facility] and [place] Number:  Producer, television/film/video and Address:  [facility], [address] [place], [place] [address]      Provider Number: [id number]  Attending Physician Name and Address:  Micki Riley, MD  Relative Name and Phone Number:       Current Level of Care: Hospital Recommended Level of Care: Skilled Nursing Facility Prior Approval Number:    Date Approved/Denied:   PASRR Number: [id number]  Discharge Plan: SNF    Current Diagnoses: Patient Active Problem List   Diagnosis Date Noted  . Hemorrhagic stroke (HCC) 02/16/2018  . Acute intracerebral hemorrhage (HCC) 02/16/2018  . Acute renal failure (HCC)   . 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 Morbid obesity (HCC) 10/07/2014  . DOE (dyspnea on exertion) 08/26/2014  . Insulin dependent type 2 diabetes mellitus, uncontrolled (HCC) 01/11/2014  . 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 Cellulitis and abscess 01/08/2014  . Hypertensive emergency 01/08/2014  . Diabetes mellitus, type 2 (HCC) 01/08/2014  . Sepsis (HCC) 01/08/2014    Orientation RESPIRATION BLADDER Height & Weight     Self, Time, Situation, Place  Normal Incontinent, Indwelling catheter Weight: (!) 387 lb 9.1 oz (175.8 kg) Height:  6\' 2"  (188 cm)  BEHAVIORAL SYMPTOMS/MOOD NEUROLOGICAL BOWEL NUTRITION STATUS      Continent Diet  AMBULATORY STATUS COMMUNICATION OF NEEDS Skin   Extensive Assist Verbally Normal                       Personal Care Assistance Level of Assistance  Bathing, Dressing Bathing Assistance: Maximum assistance   Dressing Assistance: Maximum assistance     Functional Limitations Info             SPECIAL CARE FACTORS FREQUENCY  PT (By licensed PT), OT (By licensed OT)      PT Frequency: 5/wk OT Frequency: 5/wk            Contractures      Additional Factors Info  Code Status, Allergies, Insulin Sliding Scale Code Status Info: FULL Allergies Info: Minocycline, Shellfish Allergy   Insulin Sliding Scale Info: 6/day       Current Medications (02/19/2018):  This is the current hospital active medication list Current Facility-Administered Medications  Medication Dose Route Frequency Provider Last Rate Last Dose  .  stroke: mapping our early stages of recovery book   Does not apply Once Aroor, Georgiana Spinner R, MD      . 0.9 %  sodium chloride infusion  250 mL Intravenous PRN Tobey Grim, NP      . acetaminophen (TYLENOL) tablet 650 mg  650 mg Oral Q4H PRN Aroor, Dara Lords, MD       Or  . acetaminophen (TYLENOL) solution 650 mg  650 mg Per Tube Q4H PRN Aroor, Dara Lords, MD       Or  . acetaminophen (TYLENOL) suppository 650 mg  650 mg Rectal Q4H PRN Aroor, Georgiana Spinner R, MD      . amLODipine (NORVASC) tablet 10 mg  10 mg Per Tube Daily Rinehuls, David L, PA-C   10 mg at 02/19/18 1057  . chlorhexidine (PERIDEX) 0.12 % solution 15 mL  15 mL Mouth Rinse BID Micki Riley, MD   15 mL  at 02/19/18 1056  . Chlorhexidine Gluconate Cloth 2 % PADS 6 each  6 each Topical Q0600 Micki RileySethi, Pramod S, MD   6 each at 02/19/18 1056  . clevidipine (CLEVIPREX) infusion 0.5 mg/mL  0-21 mg/hr Intravenous Continuous Micki RileySethi, Pramod S, MD 8 mL/hr at 02/19/18 1400 4 mg/hr at 02/19/18 1400  . cloNIDine (CATAPRES) tablet 0.2 mg  0.2 mg Oral TID Lynnell JudeGray, Walter J, MD   0.2 mg at 02/19/18 1055  . insulin aspart (novoLOG) injection 0-15 Units  0-15 Units Subcutaneous Q4H Tobey GrimEubanks, Katalina M, NP   3 Units at 02/19/18 1329  . labetalol (NORMODYNE) tablet 200 mg  200 mg Oral BID Aroor, Georgiana SpinnerSushanth R, MD   200 mg at 02/19/18 1056  . labetalol (NORMODYNE,TRANDATE) injection 20-40 mg  20-40 mg Intravenous Q2H PRN Micki RileySethi, Pramod S, MD   40 mg at 02/19/18 0430  . MEDLINE mouth rinse  15 mL Mouth  Rinse q12n4p Micki RileySethi, Pramod S, MD   15 mL at 02/19/18 1327  . pantoprazole (PROTONIX) injection 40 mg  40 mg Intravenous QHS Aroor, Dara LordsSushanth R, MD   40 mg at 02/18/18 2300  . RESOURCE THICKENUP CLEAR   Oral PRN Micki RileySethi, Pramod S, MD      . senna-docusate (Senokot-S) tablet 1 tablet  1 tablet Oral BID Aroor, Dara LordsSushanth R, MD   1 tablet at 02/19/18 1056  . sodium bicarbonate tablet 650 mg  650 mg Per Tube BID Lynnell JudeGray, Walter J, MD   650 mg at 02/19/18 1056  . sodium chloride (hypertonic) 3 % solution   Intravenous Continuous Rejeana BrockKirkpatrick, McNeill P, MD 42.5 mL/hr at 02/19/18 1200    . sodium chloride 0.9 % bolus 250 mL  250 mL Intravenous Once Lynnell JudeGray, Walter J, MD   250 mL at 02/17/18 1530  . sodium chloride flush (NS) 0.9 % injection 10-40 mL  10-40 mL Intracatheter Q12H Micki RileySethi, Pramod S, MD   10 mL at 02/19/18 1102  . sodium chloride flush (NS) 0.9 % injection 10-40 mL  10-40 mL Intracatheter PRN Micki RileySethi, Pramod S, MD         Discharge Medications: Please see discharge summary for a list of discharge medications.  Relevant Imaging Results:  Relevant Lab Results:   Additional Information SS#: [id number]  Burna SisUris, Jenna H, LCSW  I have personally examined this patient, reviewed notes, independently viewed imaging studies, participated in medical decision making and plan of care.ROS completed by me personally and pertinent positives fully documented  I have made any additions or clarifications directly to the above note. Agree with note above.    Delia HeadyPramod Sethi, MD Medical Director [facility] Pager: [phone] 02/19/2018 7:25 PM

## 2018-02-19 NOTE — Progress Notes (Addendum)
STROKE TEAM PROGRESS NOTE   HISTORY OF PRESENT ILLNESS (per record) [name] [name] is an 52 y.o. male  AA RH with PMH of uncontrolled HTN, CKD, insulin-dependent T2DM [facility] .  Pt reports around 6PM last night, he was sitting ina desk chair and dropped his phone and slipped to the floor. His family tried to get him up but he refused syainghe wsa fine and would get up. He was still in the floor today and so the called EMS.Now he endorses mild headacheand mild nausea. BP was >200 systolic on arrival. He was [facility] Cerdene [facility] and later switched to Kleveprix drip - but still not under control on arrival [facility].    Date last known well: 3.16.19 Time last known well: 6pm tPA Given: no, ICH NIHSS: 17 Baseline MRS 0     SUBJECTIVE (INTERVAL HISTORY) No family members present.  The patient is fairly alert.  He remains dysarthric.His blood pressure is controlled on Cardene drip. He is on hypertonic saline  serum sodium is 149 this am. he has not yet passed a swallow eval..    OBJECTIVE Temp:  [98 F (36.7 C)-99.1 F (37.3 C)] 98.4 F (36.9 C) (03/20 1200) Pulse Rate:  [55-81] 69 (03/20 1400) Cardiac Rhythm: Normal sinus rhythm (03/20 0800) Resp:  [16-32] 21 (03/20 1400) BP: (130-185)/(54-90) 175/83 (03/20 1400) SpO2:  [93 %-100 %] 98 % (03/20 1400)  CBC:  Recent Labs  Lab 02/18/18 0502 02/19/18 0446  WBC 11.9* 7.3  NEUTROABS 9.0* 4.9  HGB 8.9* 8.7*  HCT 28.2* 28.2*  MCV 82.2 85.5  PLT 268 237    Basic Metabolic Panel:  Recent Labs  Lab 02/17/18 0459  02/18/18 0502  02/18/18 2310 02/19/18 0446  NA 138   < > 141   < > 149* 149*  K 5.0  --  5.1  --   --  5.1  CL 112*  --  113*  --   --  123*  CO2 18*  --  17*  --   --  18*  GLUCOSE 186*  --  128*  --   --  123*  BUN 43*  --  50*  --   --  51*  CREATININE 4.09*  --  5.01*  --   --  5.26*  CALCIUM 8.5*  --  8.2*  --   --  8.1*  MG 2.2  --  2.3  --   --   --   PHOS  6.0*  --  6.7*  --   --  6.8*   < > = values in this interval not displayed.    Lipid Panel:     Component Value Date/Time   CHOL 164 02/18/2018 0502   TRIG 99 02/18/2018 0502   HDL 36 (L) 02/18/2018 0502   CHOLHDL 4.6 02/18/2018 0502   VLDL 20 02/18/2018 0502   LDLCALC 108 (H) 02/18/2018 0502   HgbA1c:  Lab Results  Component Value Date   HGBA1C 6.4 (H) 02/18/2018   Urine Drug Screen:     Component Value Date/Time   LABOPIA NONE DETECTED 02/16/2018 1842   COCAINSCRNUR NONE DETECTED 02/16/2018 1842   LABBENZ NONE DETECTED 02/16/2018 1842   AMPHETMU NONE DETECTED 02/16/2018 1842   THCU NONE DETECTED 02/16/2018 1842   LABBARB NONE DETECTED 02/16/2018 1842    Alcohol Level     Component Value Date/Time   ETH <10 02/16/2018 2220  IMAGING  Dg Chest 1 View 02/16/2018 IMPRESSION:  1. Confluent left lower lobe opacity compatible with collapse or consolidation.  2. Right lung ventilation is within normal limits.  3. Chronic cardiomegaly.    Dg Pelvis 1-2 Views 02/16/2018 IMPRESSION:  No acute fracture or dislocation identified about the pelvis.    Ct Head Wo Contrast 02/17/2018 IMPRESSION:  1. No significant interval change in size and appearance of right basal ganglia intraparenchymal hemorrhage, estimated volume 28 mL. Regional mass effect with localized 7 mm right-to-left shift relatively stable. No hydrocephalus or ventricular trapping.  2. Evolving left frontotemporal scalp contusion.  3. No other acute intracranial abnormality.  4. Underlying atrophy with chronic small vessel ischemic disease and remote right PCA territory infarct.    Ct Head Wo Contrast 02/16/2018   IMPRESSION:  1. Acute right basal ganglia hemorrhage with estimated blood volume of 28 mL. Underlying chronic cerebral ischemia, including previous right PCA infarct.  2. Surrounding edema with regional mass effect including leftward midline shift up to 9 mm. Basilar cisterns are patent.  3.  Subtotal effacement of the right lateral ventricle, but no intraventricular extension of blood and no ventriculomegaly.  4. Large left scalp hematoma. No underlying skull fracture identified.   Ct Cervical Spine Wo Contrast 02/16/2018   No acute fracture in the cervical spine.    Dg Chest Port 1 View 02/16/2018 IMPRESSION:  Central catheter tip in superior vena cava. No pneumothorax. Stable cardiomegaly. No edema or consolidation.    Dg Chest Port 1 View 02/16/2018 IMPRESSION:  1. Unchanged retrocardiac opacity which may be combination of pleural fluid and atelectasis or airspace disease.  2. Unchanged cardiomegaly.  3. Mild central bronchial thickening.    Dg Abd Portable 1v\ 02/17/2018 IMPRESSION:  Tip and side port of the enteric tube below the diaphragm in the stomach.    Dg Abd Portable 1v 02/17/2018 IMPRESSION:  Tip of the enteric tube remains in the region of the distal esophagus, recommend advancement of an additional 10 cm to place the side-port below the diaphragm.    Dg Abd Portable 1v 02/17/2018 IMPRESSION:  Tip of the enteric tube in the distal esophagus, recommend advancement of least 10 cm to place the side-port below the diaphragm.    Transthoracic Echocardiogram - Left ventricle: The cavity size was mildly dilated. There was   severe concentric hypertrophy. Systolic function was mildly   reduced. The estimated ejection fraction was in the range of 45%   to 50%. Diffuse hypokinesis    Bilateral Carotid Dopplers - no significant bilateral carotid stenosis.       PHYSICAL EXAM Vitals:   02/19/18 1245 02/19/18 1300 02/19/18 1315 02/19/18 1400  BP: (!) 158/84 (!) 160/81 (!) 153/76 (!) 175/83  Pulse: 68 69 68 69  Resp: 16 (!) 32 17 (!) 21  Temp:      TempSrc:      SpO2: 99% 99% 99% 98%  Weight:      Height:       Obese middle-aged African-American male currently not in distress. . Afebrile. Head is nontraumatic. Neck is supple without bruit.     Cardiac exam no murmur or gallop. Lungs are clear to auscultation. Distal pulses are well felt. Neurological Exam :  Awake alert oriented 2. Dysarthria but can be easily understood. Right gaze preference but can look to the left and midline. Blinks to threat on the right but not the left. Left lower facial weakness. Tongue midline. Normal strength on the right side.  Left dense hemiplegia with only trace toe flexion on the left leg. Withdraws left leg. Trace withdrawal in the left upper extremity only. Tone is diminished on the left normal on the right. Deep tendon reflexes absent on the left and present on the right. Left plantar upgoing right downgoing. Gait not tested.  ASSESSMENT/PLAN Mr. CLINTON DRAGONE is a 51 y.o. male with history of hypertension, chronic kidney disease, diabetes, and right transmetatarsal amputation presenting with headache, nausea, and elevated blood pressure. He did not receive IV t-PA due to ICH.  Right basal ganglia intraparenchymal hemorrhage likely secondary to hypertension.  Resultant  Right gaze preference and left hemiplegia  CT head - Acute right basal ganglia hemorrhage with estimated blood volume of 28 mL.  MRI head - not performed  MRA head - not performed  Carotid Doppler - no significant bilateral carotid stenosis.  2D Echo - Decreased EF 45-50%. Diffuse hypokinesis.  LDL - 108 mg percent  HgbA1c - 6.4%  VTE prophylaxis - SCDs Fall precautions DIET - DYS 1 Room service appropriate? Yes; Fluid consistency: Nectar Thick  No antithrombotic prior to admission, now on No antithrombotic  Ongoing aggressive stroke risk factor management  Therapy recommendations:  CLR Disposition: rehabilitation Hypertension  Stable  Systolic blood pressure goal 160-170 at this time  Long-term BP goal normotensive  Hyperlipidemia  Lipid lowering medication PTA:  none  LDL 6.4%, goal < 70  Current lipid lowering medication: none  Await LDL -start  statin at discharge if indicated.  Diabetes  HgbA1c pending, goal < 7.0  Unc / Controlled  Other Stroke Risk Factors  Obesity, Body mass index is 49.76 kg/m., recommend weight loss, diet and exercise as appropriate   Previous right PCA infarct by imaging.   Cerebral Edema  CT  Today - 7 mm right-to-left shift relatively stable - improved from 9 mm yesterday. Continue hypertonic saline with sodium goal 150-155  Other Active Problems  Chronic kidney disease -gentle hydration  Anemia - likely chronic  Mild leukocytosis - afebrile -Decadron yesterday  NPO - meds per tube  CXR 02/16/18-  Confluent LLL opacity compatible with collapse or consolidation.    Plan / Recommendations   Taper and Dc hypertonic saline drip over next 24 hrs  Resume home blood pressure medications -Norvasc 10 mg QD Labetalol 200 mg BID  Mobilize out of bed. Therapy and rehabilitation consults.  Patient able to repeat swallow eval today   Hospital day # 3    I have personally examined this patient, reviewed notes, independently viewed imaging studies, participated in medical decision making and plan of care.ROS completed by me personally and pertinent positives fully documented  I have made any additions or clarifications directly to the above note . He presented with right basal ganglia hemorrhage secondary to uncontrolled hypertension. Follow-up imaging shows stable appearance but there is 7 mm right-to-left shift with cytotoxic edema. Recommend strict blood pressure control and close neurological monitoring.Taper hypertonic saline drip and DC after 24 hrs.  No family is available at the bedside for discussion.discuss with Dr. Warren Lacy pulmonary critical care medicine This patient is critically ill and at significant risk of neurological worsening, death and care requires constant monitoring of vital signs, hemodynamics,respiratory and cardiac monitoring, extensive review of multiple databases,  frequent neurological assessment, discussion with family, other specialists and medical decision making of high complexity.I have made any additions or clarifications directly to the above note.This critical care time does not reflect procedure time, or teaching time or  supervisory time of PA/NP/Med Resident etc but could involve care discussion time.No family available at the bedside for discussion  I spent 30 minutes of neurocritical care time  in the care of  this patient.     Delia Heady, MD Medical Director [facility] Pager: [phone] 02/19/2018 3:01 PM   To contact Stroke Continuity provider, please refer to [email or web address]. After hours, contact General Neurology

## 2018-02-19 NOTE — Progress Notes (Signed)
Inpatient Rehabilitation  Met with patient at bedside to discuss team's recommendation for IP Rehab.  Shared booklets, insurance verification letter, and answered questions.  Patient with minimal participation.    Called and spoke with spouse, Tillie Rung via phone and discussed same as above.  She expressed concerns with bed and bath rooms being located upstairs in their home with inability to stay on main level, as well as no caregiver due to her working full time with no other family members able to provide care.  She requested what her other rehab options were and said that SNF is another level of post acute rehab that can sometimes have a longer length of stay with less intensive therapies.  She has requested more information about SNF.  Notified nurse case Freight forwarder.  Call if questions.    Carmelia Roller., CCC/SLP Admission Coordinator  [place]  Cell [phone]

## 2018-02-19 NOTE — Progress Notes (Signed)
Occupational Therapy Treatment Patient Details Name: [name] ChanceReginald F Wiltgen MRN: [id number] DOB: 03/20/1966 Today's Date: 02/19/2018    History of present illness 52 yr old male with sig history of CKD Stage Iv, HTN, DM, HFpEF, presents with an acute ICH located in the right basal ganglia with midline shift and surrounding edema   OT comments  Pt lethargic this pm, but did arouse initially.  He requires mod - max A/facilitation for unsupported sitting when bed place in egress position.  He continues with Lt inattention.  No activation of Lt UE or trunk noted.   He fatigues quickly with activity and fell asleep at end of session while therapist attempting to work with him.   Bed left in chair position as pt not safe to attempt transfer to recliner at this time due to level of lethargy and heavy Rt sided lean.  Per chart, wife unable to provide necessary level of assist at discharge.  Recommendation changed to SNF.   Follow Up Recommendations  SNF;Supervision/Assistance - 24 hour    Equipment Recommendations  None recommended by OT    Recommendations for Other Services     Precautions / Restrictions Precautions Precautions: Fall Precaution Comments: Lt inattention, lethargy        Mobility Bed Mobility               General bed mobility comments: Moved bed into egress position.  With bil. feet on floor, worked on sitting balance and postural control   Transfers                 General transfer comment: Unalbe to attempt safely.  Pt lethargic this date with heavy Rt sided lean.   Bed placed in chair position at end of session     Balance Overall balance assessment: Needs assistance Sitting-balance support: Feet supported;Single extremity supported Sitting balance-Leahy Scale: Poor Sitting balance - Comments: Pt with heavy Rt sided lean.  He corrects with mod - max A/facilitation.  He requires max A to maintain static unsupported sitting.  Attempted to work on righting of  trunk and postural control, but pt frequently pushing with Rt UE.  No activation of Lt trunk noted.  He maintains flexed thoracic and lumbar spine and unable to initiate movement into anterior pelvic tilt  Postural control: Right lateral lean                                 ADL either performed or assessed with clinical judgement   ADL Overall ADL's : Needs assistance/impaired Eating/Feeding: Bed level;Sitting;Moderate assistance                                           Vision       Perception     Praxis      Cognition Arousal/Alertness: Lethargic Behavior During Therapy: Flat affect Overall Cognitive Status: Impaired/Different from baseline Area of Impairment: Attention;Following commands;Safety/judgement;Problem solving                   Current Attention Level: Sustained;Focused   Following Commands: Follows one step commands consistently Safety/Judgement: Decreased awareness of safety;Decreased awareness of deficits   Problem Solving: Slow processing;Difficulty sequencing;Requires verbal cues;Requires tactile cues;Decreased initiation General Comments: Pt lethargic throughout session.  Slow to initiate, requires max cues for problem solving  Exercises Other Exercises Other Exercises: PROM Lt hand, wrist and elbow x 5    Shoulder Instructions       General Comments BP 183/89 - RN made aware     Pertinent Vitals/ Pain       Pain Assessment: Faces Faces Pain Scale: No hurt Pain Location: left tongue  Pain Descriptors / Indicators: Sore Pain Intervention(s): (only during mastication)  Home Living                                          Prior Functioning/Environment              Frequency  Min 3X/week        Progress Toward Goals  OT Goals(current goals can now be found in the care plan section)  Progress towards OT goals: Progressing toward goals     Plan Discharge plan updated  as wife unable to provide necessary level of care at discharge.    Co-evaluation                 AM-PAC PT "6 Clicks" Daily Activity     Outcome Measure   Help from another person eating meals?: A Lot Help from another person taking care of personal grooming?: A Lot Help from another person toileting, which includes using toliet, bedpan, or urinal?: Total Help from another person bathing (including washing, rinsing, drying)?: Total Help from another person to put on and taking off regular upper body clothing?: Total Help from another person to put on and taking off regular lower body clothing?: Total 6 Click Score: 8    End of Session Equipment Utilized During Treatment: Oxygen  OT Visit Diagnosis: Hemiplegia and hemiparesis Hemiplegia - Right/Left: Left Hemiplegia - dominant/non-dominant: Dominant Hemiplegia - caused by: Nontraumatic intracerebral hemorrhage   Activity Tolerance Patient limited by fatigue;Patient limited by lethargy   Patient Left in bed;with call bell/phone within reach   Nurse Communication Mobility status;Other (comment)(Elevated BP )        Time: 6045-4098 OT Time Calculation (min): 27 min  Charges: OT General Charges $OT Visit: 1 Visit OT Treatments $Neuromuscular Re-education: 23-37 mins  [employer], OTR/L [phone]    Jeani Hawking M 02/19/2018, 6:26 PM

## 2018-02-19 NOTE — Progress Notes (Signed)
  Speech Language Pathology Treatment: Dysphagia  Patient Details Name: [name] ChanceReginald F Tuohey MRN: [id number] DOB: 1966-02-27 Today's Date: 02/19/2018 Time: 1445-1500 SLP Time Calculation (min) (ACUTE ONLY): 15 min  Assessment / Plan / Recommendation Clinical Impression  Treatment focused on dysphagia goals. Patient sleeping upon arrival but easily aroused and able to maintain alert state for entire session. Consuming nectar thick liquids via small self fed cup sips without overt indication of aspiration. Requested trials of mechanical soft solid to determine potential to advance diet. Patient with continued oral deficits due to left sided weakness and sensation loss characterized by decreased bolus cohesion, anterior labial spillage, and oral transit delays with suspected premature spillage of bolus based on FEES results from 3/18. Moderate cueing provided to masticate on the right which aided in improving bolus cohesion however patient becoming quickly fatigued and with increased WOB increasing aspiration risk. Will continue current diet with continued SLP f/u.    HPI HPI: Pt reports around 6PM last night, he was sitting ina desk chair and dropped his phoneand slipped to the floor. His family tried to get him up but he refused syainghe wsa fine and would get up. He was still in the floor today and so the called EMS.Now he endorses mild headacheand mild nausea.BP was >200 systolic on arrival. He was [facility] C[facility] gtt [facility] and later switched to Kleveprix drip - but still not under control on [facility] confirmed Right basal ganglia hemorrhage.       SLP Plan  Continue with current plan of care       Recommendations  Diet recommendations: Dysphagia 1 (puree);Nectar-thick liquid Liquids provided via: Cup;No straw;Teaspoon Medication Administration: Crushed with puree Supervision: Patient able to self feed;Full supervision/cueing for compensatory  strategies Compensations: Minimize environmental distractions;Slow rate;Small sips/bites;Monitor for anterior loss;Lingual sweep for clearance of pocketing Postural Changes and/or Swallow Maneuvers: Seated upright 90 degrees                Oral Care Recommendations: Oral care BID Follow up Recommendations: Inpatient Rehab SLP Visit Diagnosis: Dysphagia, oropharyngeal phase (R13.12) Plan: Continue with current plan of care       GO              Ferdinand LangoLeah Derenda Giddings MA, CCC-SLP [phone]   Hero Mccathern Meryl 02/19/2018, 3:14 PM

## 2018-02-19 NOTE — Progress Notes (Signed)
PULMONARY / CRITICAL CARE MEDICINE   Name: [name] ChanceReginald F Aber MRN: [id number] DOB: [birth year]    ADMISSION DATE:  02/16/2018  CHIEF COMPLAINT:  ICH  HISTORY OF PRESENT ILLNESS:        52 year old male with CKD stage IV, Uncontrolled HTN, DM, Diastolic Heart Failure (EF 50-55)       Presents to ED on 3/17 after being found on floor laying on the left side at 6 pm 3/16. On floor for estimated 18 hours. Presented to ED with headache, nausea, and left sided weakness with slurred speech. BP on arrival 245/132. CT head with acute right basal ganglia hemorrhage with midline shift about 9 mm. Transferred to [facility] for further care. Arrived on Cleviprex with systolic in the 200s. PCCM asked to consult.       This is post bleed day 3.  Follow-up CT scan at 24 hours did not show expansion of the hematoma.  He is still requiring a significant dose of Cleviprex for blood pressure control.  He passed a swallow study yesterday and is feeding himself this morning.  He is able to respond and monosyllabic's and is very alert.  Denies any cough after feeding.  He continues to receive hypertonic saline. PAST MEDICAL HISTORY :  He  has a past medical history of Cellulitis and abscess of leg (01/08/2014), Diabetes mellitus, Diverticulitis, and Hypertension.  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  Allergies  Allergen Reactions  . Minocycline Anaphylaxis    SOB, itching, hives.  . Shellfish Allergy Nausea And Vomiting    No current facility-administered medications on file prior to encounter.    Current Outpatient Medications on File Prior to Encounter  Medication Sig  . amLODipine (NORVASC) 10 MG tablet Take 1 tablet (10 mg total) by mouth daily.  . furosemide (LASIX) 20 MG tablet Take 40 mg by mouth daily.   . Insulin Detemir (LEVEMIR) 100 UNIT/ML Pen Inject 22 Units into the  skin daily at 10 pm.  . labetalol (NORMODYNE) 200 MG tablet Take 200 mg by mouth 2 (two) times daily.   Marland Kitchen. LORazepam (ATIVAN) 0.5 MG tablet Take 0.5 mg by mouth 2 (two) times daily as needed for anxiety.  . tamsulosin (FLOMAX) 0.4 MG CAPS capsule Take one capsule daily until stone passes. (Patient not taking: Reported on 02/16/2018)    FAMILY HISTORY:  His indicated that his mother is alive. He indicated that his father is alive. He indicated that his sister is alive.   SOCIAL HISTORY: He  reports that  has never smoked. he has never used smokeless tobacco. He reports that he does not drink alcohol or use drugs.  REVIEW OF SYSTEMS:   Not obtainable SUBJECTIVE:  As above  VITAL SIGNS: BP (!) 158/66   Pulse 69   Temp 98.5 F (36.9 C) (Axillary)   Resp 18   Ht 6\' 2"  (1.88 m)   Wt (!) 387 lb 9.1 oz (175.8 kg)   SpO2 98%   BMI 49.76 kg/m   HEMODYNAMICS:    VENTILATOR SETTINGS:    INTAKE / OUTPUT: I/O last 3 completed shifts: In: 4444.5 [P.O.:600; I.V.:3844.5] Out: 1725 [Urine:1725]  PHYSICAL EXAMINATION: General: Resting comfortably in bed.   Neuro: Pupils are equal, EOMs appear to be full, and the face is symmetric.  He is using the right upper extremity to feed himself but has no movement in the  left upper extremity.  He is able to move the left toes. Cardiovascular: S1 and S2 are regular without murmur rub or gallop Lungs: Respirations are unlabored, there is symmetric air movement, no wheezes Abdomen: The abdomen is grossly obese without any obvious organomegaly masses tenderness guarding or rebound Musculoskeletal: Right toes are surgically absent.  There is no dependent edema.   LABS:  BMET Recent Labs  Lab 02/17/18 0459  02/18/18 0502  02/18/18 1730 02/18/18 2310 02/19/18 0446  NA 138   < > 141   < > 134* 149* 149*  K 5.0  --  5.1  --   --   --  5.1  CL 112*  --  113*  --   --   --  123*  CO2 18*  --  17*  --   --   --  18*  BUN 43*  --  50*  --   --    --  51*  CREATININE 4.09*  --  5.01*  --   --   --  5.26*  GLUCOSE 186*  --  128*  --   --   --  123*   < > = values in this interval not displayed.    Electrolytes Recent Labs  Lab 02/17/18 0459 02/18/18 0502 02/19/18 0446  CALCIUM 8.5* 8.2* 8.1*  MG 2.2 2.3  --   PHOS 6.0* 6.7* 6.8*    CBC Recent Labs  Lab 02/17/18 0459 02/18/18 0502 02/19/18 0446  WBC 11.1* 11.9* 7.3  HGB 9.2* 8.9* 8.7*  HCT 29.4* 28.2* 28.2*  PLT 275 268 237    Coag's No results for input(s): APTT, INR in the last 168 hours.  Sepsis Markers Recent Labs  Lab 02/16/18 2220 02/17/18 0459 02/18/18 0502  PROCALCITON 0.15 0.22 0.31    ABG No results for input(s): PHART, PCO2ART, PO2ART in the last 168 hours.  Liver Enzymes Recent Labs  Lab 02/16/18 1714  AST 17  ALT 20  ALKPHOS 93  BILITOT 1.0  ALBUMIN 3.0*    Cardiac Enzymes No results for input(s): TROPONINI, PROBNP in the last 168 hours.  Glucose Recent Labs  Lab 02/18/18 1133 02/18/18 1556 02/18/18 1955 02/18/18 2332 02/19/18 0329 02/19/18 0753  GLUCAP 137* 134* 140* 155* 116* 106*    Imaging US Renal  Result Date: 02/18/2018 CLINICAL DATA:  52 year old hypertensive diabetic male with chronic kidney disease. Initial encounter. EXAM: RENAL / URINARY TRACT ULTRASOUND COMPLETE COMPARISON:  12/16/2017 renal sonogram. FINDINGS: Right Kidney: Length: 14.3 cm. Echogenicity within normal limits. No mass or hydronephrosis visualized. Left Kidney: Length: 14 cm. Echogenicity within normal limits. No hydronephrosis. Left lower pole 9.8 mm nonobstructing stone. Bladder: Decompressed by Foley catheter and therefore not evaluated. IMPRESSION: No hydronephrosis. Left lower pole 9.8 mm nonobstructing stone. Electronically Signed   By: Lacy Duverney M.D.   On: 02/18/2018 10:00     STUDIES:  Repeat CT early this morning did not show progression of his basal ganglia hemorrhage   DISCUSSION:      This is a 52 year old hypertensive with  a history of chronic renal disease and diastolic congestive heart failure as well as diabetes who slumped to the floor and was found to have a right basal ganglia hemorrhage.  He has tolerated extubation without difficulty and passed a swallow study yesterday.  ASSESSMENT / PLAN:  PULMONARY A: No active issues, there is no difficulty in maintaining his airway  CARDIOVASCULAR A: No indication of CHF on exam despite history of  diastolic dysfunction.  Blood pressures proving difficult to control he remains on Cleviprex despite also receiving a very generous dose of labetalol Norvasc and clonidine.  I have increased the total dose of clonidine today hoping to wean off the parenteral agent.   RENAL A: I am concerned by his very marginal renal function.  Ultrasound shows structurally normal kidneys without obstruction.  Urinalysis is remarkable for high protein.  GASTROINTESTINAL A: GI prophylaxis is with Protonix  HEMATOLOGIC A: No chemical DVT prophylaxis due to intracranial hemorrhage  INFECTIOUS A: Currently no active issues   ENDOCRINE A: Sliding scale insulin is currently in place for diabetic control     NEUROLOGIC A:   This is day 3 status post hypertensive right basal ganglia hemorrhage.  He continues 3% saline for edema surrounding the hemorrhage.  Have switched him to enteral clonidine at a higher dose hoping to get him weaned off of Cleviprex.  Penny Pia, MD Pulmonary and Critical Care Medicine [facility] Pager: [phone]  02/19/2018, 8:49 AM

## 2018-02-20 LAB — BASIC METABOLIC PANEL
Anion gap: 7 (ref 5–15)
BUN: 51 mg/dL — ABNORMAL HIGH (ref 6–20)
CALCIUM: 8.3 mg/dL — AB (ref 8.9–10.3)
CO2: 17 mmol/L — AB (ref 22–32)
CREATININE: 5.2 mg/dL — AB (ref 0.61–1.24)
Chloride: 126 mmol/L — ABNORMAL HIGH (ref 101–111)
GFR calc Af Amer: 14 mL/min — ABNORMAL LOW (ref >60)
GFR calc non Af Amer: 12 mL/min — ABNORMAL LOW (ref >60)
GLUCOSE: 153 mg/dL — AB (ref 65–99)
Potassium: 5.9 mmol/L — ABNORMAL HIGH (ref 3.5–5.1)
Sodium: 150 mmol/L — ABNORMAL HIGH (ref 135–145)

## 2018-02-20 LAB — CBC WITH DIFFERENTIAL/PLATELET
Basophils Absolute: 0 10*3/uL (ref 0.0–0.1)
Basophils Relative: 0 %
Eosinophils Absolute: 0.3 10*3/uL (ref 0.0–0.7)
Eosinophils Relative: 4 %
HEMATOCRIT: 29.6 % — AB (ref 39.0–52.0)
Hemoglobin: 9 g/dL — ABNORMAL LOW (ref 13.0–17.0)
LYMPHS ABS: 1.5 10*3/uL (ref 0.7–4.0)
LYMPHS PCT: 23 %
MCH: 25.9 pg — ABNORMAL LOW (ref 26.0–34.0)
MCHC: 30.4 g/dL (ref 30.0–36.0)
MCV: 85.3 fL (ref 78.0–100.0)
MONO ABS: 0.8 10*3/uL (ref 0.1–1.0)
Monocytes Relative: 12 %
NEUTROS ABS: 4.1 10*3/uL (ref 1.7–7.7)
Neutrophils Relative %: 61 %
Platelets: 229 10*3/uL (ref 150–400)
RBC: 3.47 MIL/uL — ABNORMAL LOW (ref 4.22–5.81)
RDW: 16.8 % — AB (ref 11.5–15.5)
WBC: 6.6 10*3/uL (ref 4.0–10.5)

## 2018-02-20 LAB — GLUCOSE, CAPILLARY
GLUCOSE-CAPILLARY: 148 mg/dL — AB (ref 65–99)
GLUCOSE-CAPILLARY: 140 mg/dL — AB (ref 65–99)
Glucose-Capillary: 130 mg/dL — ABNORMAL HIGH (ref 65–99)
Glucose-Capillary: 153 mg/dL — ABNORMAL HIGH (ref 65–99)
Glucose-Capillary: 131 mg/dL — ABNORMAL HIGH (ref 65–99)

## 2018-02-20 LAB — SODIUM: Sodium: 148 mmol/L — ABNORMAL HIGH (ref 135–145)

## 2018-02-20 MED ORDER — SODIUM BICARBONATE 650 MG PO TABS
650.0000 mg | ORAL_TABLET | Freq: Three times a day (TID) | ORAL | Status: DC
  Administered 2018-02-20 – 2018-02-22 (×8): 650 mg
  Filled 2018-02-20 (×8): qty 1

## 2018-02-20 MED ORDER — LABETALOL HCL 200 MG PO TABS
200.0000 mg | ORAL_TABLET | Freq: Three times a day (TID) | ORAL | Status: DC
  Administered 2018-02-20 – 2018-02-21 (×5): 200 mg via ORAL
  Filled 2018-02-20: qty 2
  Filled 2018-02-20 (×4): qty 1

## 2018-02-20 NOTE — Progress Notes (Signed)
Pt admitted to the unit as a transfer from 4N. Pt A&O x4; LUE is edematous and nonpitting. Pt oriented to the unit and room; fall/safety precaution and prevention education completed; pt voices understanding and denies any questions. Pt VSS: telemetry applied and verified with CCMD; NT called to second verify. Pt in bed with call light within reach and bed alarm on. Will continue to closely monitor. Dionne BucyP. Amo Maddex Garlitz RN

## 2018-02-20 NOTE — Care Management Note (Signed)
Case Management Note  Patient Details  Name: [name] [name] MRN: [id number] Date of Birth: July 25, 1966  Subjective/Objective:   Pt presents 02/16/18 with an acute ICH located in the right basal ganglia with midline shift and surrounding edema.  PTA, pt independent, lives at home with spouse.              Action/Plan: PT/OT recommending CIR and rehab consult in progress.  Will follow.   Expected Discharge Date:                  Expected Discharge Plan:  Skilled Nursing Facility  In-House Referral:     Discharge planning Services  CM Consult  Post Acute Care Choice:    Choice offered to:     DME Arranged:    DME Agency:     HH Arranged:    HH Agency:     Status of Service:  In process, will continue to follow  If discussed at Long Length of Stay Meetings, dates discussed:    Additional Comments:  02/19/18 J. Chrisanna Mishra, RN, BSN Notified CSW that pt's wife wanting information on SNF placement for rehab; prefers SNF over CIR.  Glennon MacAmerson, [name] Hjort M, RN 02/20/2018, 5:20 PM

## 2018-02-20 NOTE — Progress Notes (Signed)
Physical Therapy Treatment Patient Details Name: [name] [name] MRN: [id number] DOB: 04-11-1966 Today's Date: 02/20/2018    History of Present Illness 52 yr old male with sig history of CKD Stage Iv, HTN, DM, HFpEF, presents with an acute ICH located in the right basal ganglia with midline shift and surrounding edema    PT Comments    Pt with increased alertness from prior sessions. Pt with improved ability to achieve midline in sitting but still with such weak activation of bil LE unable to successfully stand and required lift for OOB. Pt educated for and encouraged to perform LE HEP as RLE also weak limiting functional mobility. Pt remained 94-96% SpO2 on RA throughout session with initial BP 174/81 supine and 144/62 after mobility in chair. Will continue to follow.     Follow Up Recommendations  SNF;Supervision/Assistance - 24 hour     Equipment Recommendations  Wheelchair (measurements PT);Hospital bed;Wheelchair cushion (measurements PT)    Recommendations for Other Services       Precautions / Restrictions Precautions Precautions: Fall Precaution Comments: Lt inattention, lethargy, left hemiparesis Restrictions Weight Bearing Restrictions: No    Mobility  Bed Mobility Overal bed mobility: Needs Assistance Bed Mobility: Supine to Sit           General bed mobility comments: utilized foot egress to achieve sitting position with pt able to use RUE on rail to shift trunk right to left and bring trunk forward  Transfers Overall transfer level: Needs assistance   Transfers: Sit to/from Stand           General transfer comment: attempted sit to stand x 2 from foot egress with height elevated and bil knees blocked, pt pushing with RUE with limited activation of RLE, trunk lean to left and inability to clear surface with max +2 assist with pad and belt. Used maxi sky from foot egress to lift to chair  Ambulation/Gait             General Gait Details: unable  to perform   Stairs            Wheelchair Mobility    Modified Rankin (Stroke Patients Only)       Balance Overall balance assessment: Needs assistance Sitting-balance support: Feet supported;Single extremity supported Sitting balance-Leahy Scale: Poor Sitting balance - Comments: pt with varied Rt and left lean in sitting with ability to correct with rUE on rail but tends to over correct right lean to left with tactile and verbal cues to achieve midline. unable to maintain without UE support.                                     Cognition Arousal/Alertness: Awake/alert Behavior During Therapy: Flat affect Overall Cognitive Status: Impaired/Different from baseline Area of Impairment: Attention;Following commands;Safety/judgement;Problem solving                   Current Attention Level: Sustained   Following Commands: Follows one step commands consistently Safety/Judgement: Decreased awareness of safety;Decreased awareness of deficits   Problem Solving: Slow processing;Difficulty sequencing;Requires verbal cues;Requires tactile cues;Decreased initiation        Exercises General Exercises - Lower Extremity Long Arc Quad: AROM;PROM;10 reps;Seated;15 reps;Right;Left;Limitations Long [employer] Quad Limitations: PROM on L Hip Flexion/Marching: AAROM;10 reps;Seated;Right;Left Hip Flexion/Marching Limitations: slight hip flexion on LLE with extensive assist to achieve motion on left, only 2/5 activation on right with assist  General Comments        Pertinent Vitals/Pain Pain Assessment: No/denies pain    Home Living                      Prior Function            PT Goals (current goals can now be found in the care plan section) Progress towards PT goals: Progressing toward goals    Frequency           PT Plan Discharge plan needs to be updated    Co-evaluation              AM-PAC PT "6 Clicks" Daily Activity  Outcome  Measure  Difficulty turning over in bed (including adjusting bedclothes, sheets and blankets)?: Unable Difficulty moving from lying on back to sitting on the side of the bed? : Unable Difficulty sitting down on and standing up from a chair with arms (e.g., wheelchair, bedside commode, etc,.)?: Unable Help needed moving to and from a bed to chair (including a wheelchair)?: Total Help needed walking in hospital room?: Total Help needed climbing 3-5 steps with a railing? : Total 6 Click Score: 6    End of Session Equipment Utilized During Treatment: Gait belt Activity Tolerance: Patient tolerated treatment well Patient left: in chair;with call bell/phone within reach;with chair alarm set Nurse Communication: Mobility status;Need for lift equipment PT Visit Diagnosis: Hemiplegia and hemiparesis;Other abnormalities of gait and mobility (R26.89);Other symptoms and signs involving the nervous system (R29.898) Hemiplegia - Right/Left: Left Hemiplegia - dominant/non-dominant: Non-dominant Hemiplegia - caused by: Nontraumatic SAH     Time: 1191-4782 PT Time Calculation (min) (ACUTE ONLY): 25 min  Charges:  $Therapeutic Exercise: 8-22 mins $Therapeutic Activity: 8-22 mins                    G Codes:       Delaney Meigs, PT [phone]    Nicholes Hibler B Chetara Kropp 02/20/2018, 11:40 AM

## 2018-02-20 NOTE — Progress Notes (Signed)
Attempted to call patient's wife to notify of pt's transfer to [phone] but there was no answer. Pt will try again when he is in new room with phone. Parmvir Boomer, [place] ScrapeSarah E, RN

## 2018-02-20 NOTE — Progress Notes (Signed)
PULMONARY / CRITICAL CARE MEDICINE   Name: [name] [name] MRN: [id number] DOB: May 08, 1966    ADMISSION DATE:  02/16/2018  CHIEF COMPLAINT:  ICH  HISTORY OF PRESENT ILLNESS:        52 year old male with CKD stage IV, Uncontrolled HTN, DM, Diastolic Heart Failure (EF 50-55)       Presents to ED on 3/17 after being found on floor laying on the left side at 6 pm 3/16. On floor for estimated 18 hours. Presented to ED with headache, nausea, and left sided weakness with slurred speech. BP on arrival 245/132. CT head with acute right basal ganglia hemorrhage with midline shift about 9 mm. Transferred to [facility] for further care. Arrived on Cleviprex with systolic in the 200s. PCCM asked to consult.       This is post bleed day 3.  Follow-up CT scan at 24 hours did not show expansion of the hematoma.  He is still requiring a significant dose of Cleviprex for blood pressure control.  He passed a swallow study yesterday and is feeding himself this morning.  He is able to respond and monosyllabic's and is very alert.  Denies any cough after feeding.  He continues to receive hypertonic saline. PAST MEDICAL HISTORY :  He  has a past medical history of Cellulitis and abscess of leg (01/08/2014), Diabetes mellitus, Diverticulitis, and Hypertension.  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  Allergies  Allergen Reactions  . Minocycline Anaphylaxis    SOB, itching, hives.  . Shellfish Allergy Nausea And Vomiting    No current facility-administered medications on file prior to encounter.    Current Outpatient Medications on File Prior to Encounter  Medication Sig  . amLODipine (NORVASC) 10 MG tablet Take 1 tablet (10 mg total) by mouth daily.  . furosemide (LASIX) 20 MG tablet Take 40 mg by mouth daily.   . Insulin Detemir (LEVEMIR) 100 UNIT/ML Pen Inject 22 Units into the  skin daily at 10 pm.  . labetalol (NORMODYNE) 200 MG tablet Take 200 mg by mouth 2 (two) times daily.   Marland Kitchen. LORazepam (ATIVAN) 0.5 MG tablet Take 0.5 mg by mouth 2 (two) times daily as needed for anxiety.  . tamsulosin (FLOMAX) 0.4 MG CAPS capsule Take one capsule daily until stone passes. (Patient not taking: Reported on 02/16/2018)    FAMILY HISTORY:  His indicated that his mother is alive. He indicated that his father is alive. He indicated that his sister is alive.   SOCIAL HISTORY: He  reports that he has never smoked. He has never used smokeless tobacco. He reports that he does not drink alcohol or use drugs.  REVIEW OF SYSTEMS:   Not obtainable SUBJECTIVE:       He is sitting up in a chair today and far more verbal and he has been.  He has no unusual complaints.  Is not having difficulties with coughing after swallowing.  VITAL SIGNS: BP (!) 146/63   Pulse 73   Temp 98 F (36.7 C)   Resp 16   Ht 6\' 2"  (1.88 m)   Wt (!) 387 lb 9.1 oz (175.8 kg)   SpO2 95%   BMI 49.76 kg/m   HEMODYNAMICS:    VENTILATOR SETTINGS:    INTAKE / OUTPUT: I/O last 3 completed shifts: In: 2217.9 [I.V.:2217.9] Out: 3670 [Urine:3670]  PHYSICAL EXAMINATION: General: Sitting comfortably in a chair  and appropriately conversant .   Neuro: Pupils are equal, EOMs appear to be full, and the face is symmetric.  He is using the right upper extremity.  Is no movement in the left upper extremity and is able to move the left toes. Cardiovascular: S1 and S2 are regular without murmur rub or gallop Lungs: Respirations are unlabored, there is symmetric air movement, no wheezes Abdomen: The abdomen is grossly obese without any obvious organomegaly masses tenderness guarding or rebound Musculoskeletal: Right toes are surgically absent.  There is no dependent edema.   LABS:  BMET Recent Labs  Lab 02/18/18 0502  02/19/18 0446 02/19/18 1605 02/19/18 2219 02/20/18 0416  NA 141   < > 149* 150* 149* 150*   K 5.1  --  5.1  --   --  5.9*  CL 113*  --  123*  --   --  126*  CO2 17*  --  18*  --   --  17*  BUN 50*  --  51*  --   --  51*  CREATININE 5.01*  --  5.26*  --   --  5.20*  GLUCOSE 128*  --  123*  --   --  153*   < > = values in this interval not displayed.    Electrolytes Recent Labs  Lab 02/17/18 0459 02/18/18 0502 02/19/18 0446 02/20/18 0416  CALCIUM 8.5* 8.2* 8.1* 8.3*  MG 2.2 2.3  --   --   PHOS 6.0* 6.7* 6.8*  --     CBC Recent Labs  Lab 02/18/18 0502 02/19/18 0446 02/20/18 0416  WBC 11.9* 7.3 6.6  HGB 8.9* 8.7* 9.0*  HCT 28.2* 28.2* 29.6*  PLT 268 237 229    Coag's No results for input(s): APTT, INR in the last 168 hours.  Sepsis Markers Recent Labs  Lab 02/16/18 2220 02/17/18 0459 02/18/18 0502  PROCALCITON 0.15 0.22 0.31    ABG No results for input(s): PHART, PCO2ART, PO2ART in the last 168 hours.  Liver Enzymes Recent Labs  Lab 02/16/18 1714  AST 17  ALT 20  ALKPHOS 93  BILITOT 1.0  ALBUMIN 3.0*    Cardiac Enzymes No results for input(s): TROPONINI, PROBNP in the last 168 hours.  Glucose Recent Labs  Lab 02/19/18 1149 02/19/18 1543 02/19/18 1923 02/19/18 2324 02/20/18 0321 02/20/18 0754  GLUCAP 174* 147* 152* 133* 130* 153*    Imaging No results found.    DISCUSSION:      This is a 52 year old hypertensive with a history of chronic renal disease and diastolic congestive heart failure as well as diabetes who slumped to the floor and was found to have a right basal ganglia hemorrhage.  He was extubated on 3/19 and is passed a swallow study.  He has been weaned off of Cleviprex.    ASSESSMENT / PLAN:  PULMONARY A: No active issues, there is no difficulty in maintaining his airway  CARDIOVASCULAR A: No indication of CHF on exam despite history of diastolic dysfunction.    RENAL A: I am concerned by his very marginal renal function.  Though his creatinine has not risen overnight his bicarbonate is low and potassium  marginally elevated.  I have increased his p.o. bicarbonate supplement but I am anticipating that he is nearing the point where intervention will be required and will consult nephrology.    GASTROINTESTINAL A: GI prophylaxis is with Protonix  HEMATOLOGIC A: No chemical DVT prophylaxis due to intracranial hemorrhage  INFECTIOUS A: Currently no  active issues   ENDOCRINE A: Sliding scale insulin is currently in place for diabetic control     NEUROLOGIC A:   This is day 4 status post hypertensive right basal ganglia hemorrhage.  He has been weaned off of Cleviprex with the addition of enteral clonidine.  Although he remains profoundly hemiplegic he is more alert today and better able to communicate.  I am anticipating transfer out of the intensive care unit, the critical care service will not routinely follow following transfer  Penny Pia, MD Pulmonary and Critical Care Medicine [facility] Pager: [phone]  02/20/2018, 11:07 AM

## 2018-02-20 NOTE — Progress Notes (Addendum)
STROKE TEAM PROGRESS NOTE   SUBJECTIVE (INTERVAL HISTORY) Patient up in chair at bedside. BP continue to be elevated with several prns during the night - daily labetolol dose increased. Tolerating D1 diet and po meds. CCM concerned about kidney fxn, consult requested. Now off 3%. Plan transfer to floor today. Discussed w/ pt and RN. Blood pressure adequately controlled   OBJECTIVE Vitals:   02/20/18 1000 02/20/18 1100 02/20/18 1200 02/20/18 1300  BP: (!) 146/63 139/66 (!) 160/74 (!) 172/79  Pulse: 73 70 70 71  Resp: 16 15 17 17   Temp:    98.7 F (37.1 C)  TempSrc:    Oral  SpO2: 95% 97% 99% 96%  Weight:      Height:        CBC:  Recent Labs  Lab 02/19/18 0446 02/20/18 0416  WBC 7.3 6.6  NEUTROABS 4.9 4.1  HGB 8.7* 9.0*  HCT 28.2* 29.6*  MCV 85.5 85.3  PLT 237 229    Basic Metabolic Panel:  Recent Labs  Lab 02/17/18 0459  02/18/18 0502  02/19/18 0446  02/19/18 2219 02/20/18 0416  NA 138   < > 141   < > 149*   < > 149* 150*  K 5.0  --  5.1  --  5.1  --   --  5.9*  CL 112*  --  113*  --  123*  --   --  126*  CO2 18*  --  17*  --  18*  --   --  17*  GLUCOSE 186*  --  128*  --  123*  --   --  153*  BUN 43*  --  50*  --  51*  --   --  51*  CREATININE 4.09*  --  5.01*  --  5.26*  --   --  5.20*  CALCIUM 8.5*  --  8.2*  --  8.1*  --   --  8.3*  MG 2.2  --  2.3  --   --   --   --   --   PHOS 6.0*  --  6.7*  --  6.8*  --   --   --    < > = values in this interval not displayed.     PHYSICAL EXAM per Dr. Pearlean [name] Obese middle-aged African-American male currently not in distress. Afebrile. Head is nontraumatic. Neck is supple without bruit. Cardiac exam no murmur or gallop. Lungs are clear to auscultation. Distal pulses are well felt. Neurological Exam:  Awake alert oriented 2. Dysarthria but can be easily understood. Right gaze preference but can look to the left and midline. Blinks to threat on the right but not the left. Left lower facial weakness. Tongue deviates L.  Normal strength on the right side. Left dense hemiplegia with only trace toe flexion on the left leg. Withdraws left leg. Trace withdrawal in the left upper extremity only. Tone is diminished on the left normal on the right. Deep tendon reflexes absent on the left and present on the right. Left plantar upgoing right downgoing. Gait not tested.  ASSESSMENT/PLAN [name] with history of hypertension, chronic kidney disease, diabetes, and right transmetatarsal amputation presenting with headache, nausea, and elevated blood pressure. He did not receive IV t-PA due to ICH.  Right basal ganglia intraparenchymal hemorrhage w/ resolving cytotoxic cerebral edema, secondary to hypertension  Resultant  Right gaze preference, R field cut, L hemiplegia, dysphagia  CT head  02/16/18  Acute right basal ganglia hemorrhage with estimated blood volume of 28 mL.  CT head 02/17/18 R BG IPH w/ 7,, R to L shift. Evolving L frontotemporal scalp contusion. Small vessel disease. Atrophy. 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  2D Echo - Decreased EF 45-50%. Diffuse hypokinesis.  TCD pending   LDL - 108   HgbA1c - 6.4%  VTE prophylaxis - SCDs d/c'd, added lovenox (safe as hmg now stable) Fall precautions DIET - DYS 1 Room service appropriate? Yes; Fluid consistency: Nectar Thick. ST following  No antithrombotic prior to admission, now on No antithrombotic d/t hmg  Ongoing aggressive stroke risk factor management  Therapy recommendations:   Initially CIR but working wife unable to provide 24/7. SNF now recommended. SW consulted. FL2 signed  Disposition: pending  Transfer to floor today  Induced hypernatremia for Cerebral Edema  Treated with hypertonic saline, now off  sodium 150  Let Na drift down, do not aggressively lower  Continue to monitor  Hypertension  BP on arrival 251/158. Initially  treated with cardene, then changed to cleviprex. Drip now off.  Today elevated but stable in 170s  SBP goal < 180  PTA meds: norvasc 10, lasix 40, labetalol 200 bid  Now on:  norvasc 10, clonidine 0.2 tid, labetolol 200 bid  Increased labetalol to 200 tid  Monitor BP  Long-term BP goal normotensive  Hyperlipidemia  Lipid lowering medication PTA:  none  LDL 108  Consider statin at discharge   Type II Diabetes  HgbA1c 6.4, at goal < 7.0  CBGs 130-153  Controlled  Other Stroke Risk Factors  Morbid Obesity, Body mass index is 49.76 kg/m., recommend weight loss, diet and exercise as appropriate   Hx Stroke:  Previous right PCA infarct by imaging.  Hx diastolic dysfuction. No CHF  Other Active Problems  Chronic kidney disease -gentle hydration. Cr 5.2 continues to rise with low bicarb and elevated K 5.9. CCM treated w/ bicarb supplement. Nephrology consulted by CCM  Anemia 9.0 - likely chronic  Mild leukocytosis, improved  6.6 - afebrile - received Decadron   CXR 02/16/18-  Confluent LLL opacity compatible with collapse or consolidation.   Hospital day # 4  [name] [name] [facility] See Amion for Pager information 02/20/2018 1:44 PM   Plan transfer to the floor. Mobilize out of bed. Continue dysphagia diet and advance as tolerated. He will likely need nursing home transfer if stable over the next few days I have personally examined this patient, reviewed notes, independently viewed imaging studies, participated in medical decision making and plan of care.ROS completed by me personally and pertinent positives fully documented  I have made any additions or clarifications directly to the above note. Agree with note above. This patient is critically ill and at significant risk of neurological worsening, death and care requires constant monitoring of vital signs, hemodynamics,respiratory and cardiac monitoring, extensive review of multiple databases, frequent  neurological assessment, discussion with family, other specialists and medical decision making of high complexity.I have made any additions or clarifications directly to the above note.This critical care time does not reflect procedure time, or teaching time or supervisory time of PA/NP/Med Resident etc but could involve care discussion time.  I spent 30 minutes of neurocritical care time  in the care of  this patient.     Delia Heady, MD Medical Director [facility] Pager: [phone]  02/20/2018 3:07 PM   To contact Stroke Continuity provider, please refer to [email or web address]. After hours, contact General Neurology

## 2018-02-21 ENCOUNTER — Inpatient Hospital Stay (HOSPITAL_COMMUNITY): Payer: 59

## 2018-02-21 ENCOUNTER — Encounter (HOSPITAL_COMMUNITY): Payer: Self-pay

## 2018-02-21 DIAGNOSIS — I619 Nontraumatic intracerebral hemorrhage, unspecified: Secondary | ICD-10-CM

## 2018-02-21 LAB — IRON AND TIBC
IRON: 9 ug/dL — AB (ref 45–182)
SATURATION RATIOS: 4 % — AB (ref 17.9–39.5)
TIBC: 235 ug/dL — AB (ref 250–450)
UIBC: 226 ug/dL

## 2018-02-21 LAB — GLUCOSE, CAPILLARY
GLUCOSE-CAPILLARY: 125 mg/dL — AB (ref 65–99)
GLUCOSE-CAPILLARY: 142 mg/dL — AB (ref 65–99)
GLUCOSE-CAPILLARY: 155 mg/dL — AB (ref 65–99)
Glucose-Capillary: 132 mg/dL — ABNORMAL HIGH (ref 65–99)
Glucose-Capillary: 144 mg/dL — ABNORMAL HIGH (ref 65–99)
Glucose-Capillary: 152 mg/dL — ABNORMAL HIGH (ref 65–99)

## 2018-02-21 LAB — BASIC METABOLIC PANEL
ANION GAP: 10 (ref 5–15)
BUN: 48 mg/dL — ABNORMAL HIGH (ref 6–20)
CALCIUM: 8.6 mg/dL — AB (ref 8.9–10.3)
CHLORIDE: 122 mmol/L — AB (ref 101–111)
CO2: 18 mmol/L — AB (ref 22–32)
Creatinine, Ser: 5.15 mg/dL — ABNORMAL HIGH (ref 0.61–1.24)
GFR calc Af Amer: 14 mL/min — ABNORMAL LOW (ref >60)
GFR calc non Af Amer: 12 mL/min — ABNORMAL LOW (ref >60)
GLUCOSE: 135 mg/dL — AB (ref 65–99)
Potassium: 5.1 mmol/L (ref 3.5–5.1)
Sodium: 150 mmol/L — ABNORMAL HIGH (ref 135–145)

## 2018-02-21 MED ORDER — CALCIUM ACETATE (PHOS BINDER) 667 MG PO CAPS
1334.0000 mg | ORAL_CAPSULE | Freq: Three times a day (TID) | ORAL | Status: DC
  Administered 2018-02-21 – 2018-03-06 (×30): 1334 mg via ORAL
  Filled 2018-02-21 (×29): qty 2

## 2018-02-21 MED ORDER — PANTOPRAZOLE SODIUM 40 MG PO TBEC
40.0000 mg | DELAYED_RELEASE_TABLET | Freq: Every day | ORAL | Status: DC
Start: 2018-02-21 — End: 2018-03-06
  Administered 2018-02-21 – 2018-03-05 (×12): 40 mg via ORAL
  Filled 2018-02-21 (×13): qty 1

## 2018-02-21 NOTE — Progress Notes (Signed)
STROKE TEAM PROGRESS NOTE   SUBJECTIVE (INTERVAL HISTORY) No family at bedside. Patient feels stable, no changes overnight.   OBJECTIVE Vitals:   02/20/18 2000 02/21/18 0000 02/21/18 0400 02/21/18 0815  BP: (!) 160/76 (!) 161/69 (!) 177/77 (!) 214/99  Pulse: 73 72 72 72  Resp: 19 18 18 19   Temp: 97.9 F (36.6 C) 98.2 F (36.8 C) 97.8 F (36.6 C) 98.2 F (36.8 C)  TempSrc: Axillary Axillary Axillary Oral  SpO2: 97% 97% 99% 96%  Weight:    (!) 179.3 kg (395 lb 4.6 oz)  Height:        CBC:  Recent Labs  Lab 02/19/18 0446 02/20/18 0416  WBC 7.3 6.6  NEUTROABS 4.9 4.1  HGB 8.7* 9.0*  HCT 28.2* 29.6*  MCV 85.5 85.3  PLT 237 229    Basic Metabolic Panel:  Recent Labs  Lab 02/17/18 0459  02/18/18 0502  02/19/18 0446  02/20/18 0416 02/20/18 1145 02/21/18 0401  NA 138   < > 141   < > 149*   < > 150* 148* 150*  K 5.0  --  5.1  --  5.1  --  5.9*  --  5.1  CL 112*  --  113*  --  123*  --  126*  --  122*  CO2 18*  --  17*  --  18*  --  17*  --  18*  GLUCOSE 186*  --  128*  --  123*  --  153*  --  135*  BUN 43*  --  50*  --  51*  --  51*  --  48*  CREATININE 4.09*  --  5.01*  --  5.26*  --  5.20*  --  5.15*  CALCIUM 8.5*  --  8.2*  --  8.1*  --  8.3*  --  8.6*  MG 2.2  --  2.3  --   --   --   --   --   --   PHOS 6.0*  --  6.7*  --  6.8*  --   --   --   --    < > = values in this interval not displayed.     Physical exam: Exam: Gen: NAD, conversant, well nourised, morbidly obese, well groomed                     CV: RRR, no MRG. No Carotid Bruits. No peripheral edema, warm, nontender Eyes: Conjunctivae clear without exudates or hemorrhage  Neuro: Detailed Neurologic Exam  Speech:    Speech is dysarthric with normal comprehension.  Cognition:    The patient is oriented to person, month and year Cranial Nerves:    The pupils are equal, round, and reactive to light.  Visual fields are full to finger confrontation. Right gaze preference but can cross the midline.  Left lower face weakness. Left homonomous hemianopia.  Hearing intact. Voice is normal. Shoulder shrug is normal.   Coordination:    No dysmetria   Motor Observation:  no involuntary movements noted. Tone:    Decreased muscle tone left.       Strength and sensation    Dense left hemiparesis and hemisensory loss, withdraws minimally on left     Reflex Exam: Toes: Left upgoing   Clonus:    Clonus is absent.    ASSESSMENT/PLAN Mr. Loleta ChanceReginald F Therrell is a 52 y.o. male with history of hypertension, chronic kidney disease, diabetes, and right transmetatarsal  amputation presenting with headache, nausea, and elevated blood pressure. He did not receive IV t-PA due to ICH.  Right basal ganglia intraparenchymal hemorrhage w/ resolving cytotoxic cerebral edema, secondary to hypertension  Resultant  Right gaze preference, R field cut, L hemiplegia, dysphagia  CT head 02/16/18  Acute right basal ganglia hemorrhage with estimated blood volume of 28 mL.  CT head 02/17/18 R BG IPH w/ 7,, R to L shift. Evolving L frontotemporal scalp contusion. Small vessel disease. Atrophy. 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  2D Echo - Decreased EF 45-50%. Diffuse hypokinesis.  TCD pending today  LDL - 108   HgbA1c - 6.4%  VTE prophylaxis - SCDs d/c'd, added lovenox (safe as hmg now stable) Fall precautions DIET - DYS 1 Room service appropriate? Yes; Fluid consistency: Nectar Thick. ST following  No antithrombotic prior to admission, now on No antithrombotic d/t hmg  Ongoing aggressive stroke risk factor management  Therapy recommendations:   Initially CIR but working wife unable to provide 24/7. SNF now recommended. SW consulted. FL2 signed  Disposition: pending  Transfer to floor today  Induced hypernatremia for Cerebral Edema  Treated with hypertonic saline, now off  sodium 150  Let Na drift down, do not  aggressively lower  Continue to monitor  Hypertension  BP on arrival 251/158. Initially treated with cardene, then changed to cleviprex. Drip now off.  SBP goal < 180  PTA meds: norvasc 10, lasix 40, labetalol 200 bid  Now on:  norvasc 10, clonidine 0.2 tid, labetolol 200 bid  Increased labetalol to 200 tid  Monitor BP  Long-term BP goal normotensive  Hyperlipidemia  Lipid lowering medication PTA:  none  LDL 108  Consider statin at discharge   Type II Diabetes  HgbA1c 6.4, at goal < 7.0  CBGs 130-153  Controlled  Other Stroke Risk Factors  Morbid Obesity, Body mass index is 50.75 kg/m., recommend weight loss, diet and exercise as appropriate   Hx Stroke:  Previous right PCA infarct by imaging.  Hx diastolic dysfuction. No CHF  Other Active Problems  Chronic kidney disease -gentle hydration. Cr 5.2 continues to rise with low bicarb and elevated K 5.9. CCM treated w/ bicarb supplement. Nephrology consulted by [facility] plan is for dialysis per Dr. Hyman Hopes  Anemia 9.0 - likely chronic  Mild leukocytosis, improved  6.6 - afebrile - received Decadron   CXR 02/16/18-  Confluent LLL opacity compatible with collapse or consolidation.   Hospital day # 5   Personally examined patient and images, and have participated in and made any corrections needed to history, physical, neuro exam,assessment and plan as stated above.  I have personally obtained the history, evaluated lab date, reviewed imaging studies and agree with radiology interpretations.    Naomie Dean, MD Stroke Neurology   To contact Stroke Continuity provider, please refer to [email or web address]. After hours, contact General Neurology

## 2018-02-21 NOTE — Consult Note (Addendum)
[name] NOTE   MRN : [id number]  Reason for Consult: Acute on CKD Referring Physician: Dr. Hyman Hopes  History of Present Illness: 52 y/o male with history of CKD now with acute renal injury. Admitted with sever hypertension BP on arrival 245/132.  CT head with acute right basal ganglia hemorrhage with midline shift about 9 mm.  Resulting in left UE/LE paralysis with left facial droop.   We have been asked to place [facility] and access.  He was seen for access planning in January 2019.  He has a vein mapping form Jan 2019 as well.  He is right hand dominant with no previous access history.    Past medical history includes: HTN, DM and heart failure.       Current Facility-Administered Medications  Medication Dose Route Frequency Provider Last Rate Last Dose  .  stroke: mapping our early stages of recovery book   Does not apply Once Annie Main L, NP      . 0.9 %  sodium chloride infusion  250 mL Intravenous PRN Layne Benton, NP      . acetaminophen (TYLENOL) tablet 650 mg  650 mg Oral Q4H PRN Layne Benton, NP       Or  . acetaminophen (TYLENOL) solution 650 mg  650 mg Per Tube Q4H PRN Layne Benton, NP       Or  . acetaminophen (TYLENOL) suppository 650 mg  650 mg Rectal Q4H PRN Layne Benton, NP      . amLODipine (NORVASC) tablet 10 mg  10 mg Per Tube Daily Annie Main L, NP   10 mg at 02/21/18 0900  . chlorhexidine (PERIDEX) 0.12 % solution 15 mL  15 mL Mouth Rinse BID Annie Main L, NP   15 mL at 02/21/18 0900  . Chlorhexidine Gluconate Cloth 2 % PADS 6 each  6 each Topical Q0600 Layne Benton, NP   6 each at 02/21/18 [id number]  . cloNIDine (CATAPRES) tablet 0.2 mg  0.2 mg Oral TID Annie Main L, NP   0.2 mg at 02/21/18 0900  . hydrALAZINE (APRESOLINE) injection 20 mg  20 mg Intravenous Q8H PRN Annie Main L, NP   20 mg at 02/21/18 0825  . insulin aspart (novoLOG) injection 0-15 Units  0-15 Units Subcutaneous Q4H Layne Benton, NP   2 Units at  02/21/18 0858  . labetalol (NORMODYNE) tablet 200 mg  200 mg Oral TID Annie Main L, NP   200 mg at 02/21/18 0900  . labetalol (NORMODYNE,TRANDATE) injection 20-40 mg  20-40 mg Intravenous Q2H PRN Layne Benton, NP   40 mg at 02/20/18 0445  . MEDLINE mouth rinse  15 mL Mouth Rinse q12n4p Annie Main L, NP   15 mL at 02/20/18 1521  . pantoprazole (PROTONIX) EC tablet 40 mg  40 mg Oral QHS Micki Riley, MD      . RESOURCE THICKENUP CLEAR   Oral PRN Layne Benton, NP      . senna-docusate (Senokot-S) tablet 1 tablet  1 tablet Oral BID Layne Benton, NP   1 tablet at 02/21/18 0901  . sodium bicarbonate tablet 650 mg  650 mg Per Tube TID Layne Benton, NP   650 mg at 02/21/18 0900  . sodium chloride 0.9 % bolus 250 mL  250 mL Intravenous Once Annie Main L, NP   250 mL at 02/17/18 1530  . sodium chloride flush (NS) 0.9 % injection  10-40 mL  10-40 mL Intracatheter Q12H Layne Benton, NP   10 mL at 02/20/18 2111  . sodium chloride flush (NS) 0.9 % injection 10-40 mL  10-40 mL Intracatheter PRN Layne Benton, NP        Pt meds include: Statin :No Betablocker: Yes ASA: No Other anticoagulants/antiplatelets:   Past Medical History:  Diagnosis Date  . Cellulitis and abscess of leg 01/08/2014   RT LEG  . Diabetes mellitus   . Diverticulitis   . Hypertension     Past Surgical History:  Procedure Laterality Date  . COLON SURGERY    . I&D EXTREMITY Right 01/09/2014   Procedure: IRRIGATION AND DEBRIDEMENT EXTREMITY;  Surgeon: Senaida Lange, MD;  Location: [facility] OR;  Service: Orthopedics;  Laterality: Right;  . TRANSMETATARSAL AMPUTATION Right 01/15/2014   Procedure: TRANSMETATARSAL AMPUTATION;  Surgeon: Nadara Mustard, MD;  Location: [facility] OR;  Service: Orthopedics;  Laterality: Right;    Social History Social History   Tobacco Use  . Smoking status: Never Smoker  . Smokeless tobacco: Never Used  Substance Use Topics  . Alcohol use: No    Comment: SOCIAL  . Drug use: No    Family  History Family History  Problem Relation Age of Onset  . Hypertension Mother   . Diabetes Mellitus II Father     Allergies  Allergen Reactions  . Minocycline Anaphylaxis    SOB, itching, hives.  . Shellfish Allergy Nausea And Vomiting     REVIEW OF SYSTEMS  General: [ ]  Weight loss, [ ]  Fever, [ ]  chills Neurologic: [ ]  Dizziness, [ ]  Blackouts, [ ]  Seizure [ ]  Stroke, [ ]  "Mini stroke", [ ]  Slurred speech, [ ]  Temporary blindness; [ ]  weakness in arms or legs, [ ]  Hoarseness [ ]  Dysphagia [x]  intracranial hemmorage Cardiac: [ ]  Chest pain/pressure, [ ]  Shortness of breath at rest [ ]  Shortness of breath with exertion, [ ]  Atrial fibrillation or irregular heartbeat  Vascular: [ ]  Pain in legs with walking, [ ]  Pain in legs at rest, [ ]  Pain in legs at night,  [ ]  Non-healing ulcer, [ ]  Blood clot in vein/DVT,   Pulmonary: [ ]  Home oxygen, [ ]  Productive cough, [ ]  Coughing up blood, [ ]  Asthma,  [ ]  Wheezing [ ]  COPD Musculoskeletal:  [ ]  Arthritis, [ ]  Low back pain, [ ]  Joint pain Hematologic: [ ]  Easy Bruising, [ ]  Anemia; [ ]  Hepatitis Gastrointestinal: [ ]  Blood in stool, [ ]  Gastroesophageal Reflux/heartburn, Urinary: x[ ]  chronic Kidney disease, [ ]  on HD - [ ]  MWF or [ ]  TTHS, [ ]  Burning with urination, [ ]  Difficulty urinating Skin: [ ]  Rashes, [ ]  Wounds Psychological: [ ]  Anxiety, [ ]  Depression  Physical Examination Vitals:   02/20/18 2000 02/21/18 0000 02/21/18 0400 02/21/18 0815  BP: (!) 160/76 (!) 161/69 (!) 177/77 (!) 214/99  Pulse: 73 72 72 72  Resp: 19 18 18 19   Temp: 97.9 F (36.6 C) 98.2 F (36.8 C) 97.8 F (36.6 C) 98.2 F (36.8 C)  TempSrc: Axillary Axillary Axillary Oral  SpO2: 97% 97% 99% 96%  Weight:    (!) 395 lb 4.6 oz (179.3 kg)  Height:       Body mass index is 50.75 kg/m.  General:  WDWN in NAD  HENT: WNL, left facial droop Eyes: Pupils equal Pulmonary: normal non-labored breathing , without Rales, rhonchi,  wheezing Cardiac: RRR,  without  Murmurs, rubs or  gallops; No carotid bruits Abdomen: soft, NT, no masses Skin: no rashes, ulcers noted;  no Gangrene , no cellulitis; no open wounds;   Vascular Exam/Pulses:palpable radial pulses B, left DP   Musculoskeletal: no muscle wasting or atrophy; moderate LE  edema  Neurologic: A&O X 3; Appropriate Affect ;  SENSATION: normal; MOTOR FUNCTION: 5/5 right UE/LE, flaccid left UE/LE with facial droop on the left Speech is fluent/normal   Significant Diagnostic Studies: CBC Lab Results  Component Value Date   WBC 6.6 02/20/2018   HGB 9.0 (L) 02/20/2018   HCT 29.6 (L) 02/20/2018   MCV 85.3 02/20/2018   PLT 229 02/20/2018    BMET    Component Value Date/Time   NA 150 (H) 02/21/2018 0401   K 5.1 02/21/2018 0401   CL 122 (H) 02/21/2018 0401   CO2 18 (L) 02/21/2018 0401   GLUCOSE 135 (H) 02/21/2018 0401   BUN 48 (H) 02/21/2018 0401   CREATININE 5.15 (H) 02/21/2018 0401   CALCIUM 8.6 (L) 02/21/2018 0401   GFRNONAA 12 (L) 02/21/2018 0401   GFRAA 14 (L) 02/21/2018 0401   Estimated Creatinine Clearance: 29.4 mL/min (A) (by C-G formula based on SCr of 5.15 mg/dL (H)).  COAG Lab Results  Component Value Date   INR 1.45 01/19/2014   INR 1.40 01/18/2014   INR 1.19 01/17/2014     Non-Invasive Vascular Imaging:   BUE Vein Mapping  (Date: 12/13/2017):   R arm: acceptable vein conduits include questionable basilic  L arm: acceptable vein conduits include questionable basilic  BUE Doppler (Date: 12/13/2017):   R arm:  ? Brachial: tri, 4.6 mm ? Radial: tri, 3.3 mm ? Ulnar: tri, 2.7 mm  L arm:  ? Brachial: tri, 5.3 mm ? Radial: tri, 3.4 mm ? Ulnar: tri, 2.5 mm   ASSESSMENT/PLAN:  Right basal ganglia intraparenchymal hemorrhage resulting left hemiplegia.  AKI on CKD   Based on vein mapping and examination, this patient's permanent access options include: L staged BVT, possible R staged BVT.  He is right hand dominant, we will plan left UE av fistula  verses graft due to questionable basilic.  It would be more appropriate to use the left UE secondary to the hemiplegia as well.      Mosetta Pigeonmma Maureen Collins 02/21/2018 12:00 PM   Agree with above.  Has fairly marginal veins bilaterally.  In light of his left arm plegia probably better to go ahead and place left upper arm AV graft which will be easier to cannulate in what will already be a difficult situation.  We will place catheter in same setting.  Dr Arbie CookeyEarly Monday 02/24/18  Fabienne Brunsharles Nehemyah Foushee, MD [facility] [place] Office: [phone] Pager: [phone]

## 2018-02-21 NOTE — Consult Note (Signed)
Referring Provider: No ref. provider found Primary Care Physician:  [name], MD Primary Nephrologist:  Dr. 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 On floor for estimated 18 hours. Presented to ED with headache, nausea, and left sided weakness with slurred speech. BP on arrival 245/132. CT head with acute right basal ganglia hemorrhage with midline shift about 9 mm. Transferred to [facility] for further care.  He has massive edema in the lower extremities and creatinine now greater than 5   It appears that he might benefit from dialysis and will proceed with placement of permcath and fistula and start dialysis   Past Medical History:  Diagnosis Date  . Cellulitis and abscess of leg 01/08/2014   RT LEG  . Diabetes mellitus   . Diverticulitis   . Hypertension     Past Surgical History:  Procedure Laterality Date  . COLON SURGERY    . I&D EXTREMITY Right 01/09/2014   Procedure: IRRIGATION AND DEBRIDEMENT EXTREMITY;  Surgeon: Senaida Lange, MD;  Location: [facility] OR;  Service: Orthopedics;  Laterality: Right;  . 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 Authorizing Provider  amLODipine (NORVASC) 10 MG tablet Take 1 tablet (10 mg total) by mouth daily. 01/19/14   Ghimire, Werner Lean, MD  furosemide (LASIX) 20 MG tablet Take 40 mg by mouth daily.     [provider]  Insulin Detemir (LEVEMIR) 100 UNIT/ML Pen Inject 22 Units into the skin daily at 10 pm. 01/19/14   Ghimire, Werner Lean, MD  labetalol (NORMODYNE)  200 MG tablet Take 200 mg by mouth 2 (two) times daily.     [provider]  LORazepam (ATIVAN) 0.5 MG tablet Take 0.5 mg by mouth 2 (two) times daily as needed for anxiety.    [provider]  tamsulosin (FLOMAX) 0.4 MG CAPS capsule Take one capsule daily until stone passes. Patient not taking: Reported on 02/16/2018 03/23/17   Molpus, Jonny Ruiz, MD    Current Facility-Administered Medications  Medication Dose Route Frequency Provider Last Rate Last Dose  .  stroke: mapping our early stages of recovery book   Does not apply Once Annie Main L, NP      . 0.9 %  sodium chloride infusion  250 mL Intravenous PRN Layne Benton, NP      . acetaminophen (TYLENOL) tablet 650 mg  650 mg Oral Q4H PRN Layne Benton, NP       Or  . acetaminophen (TYLENOL) solution 650 mg  650 mg Per Tube Q4H PRN Layne Benton, NP       Or  . acetaminophen (TYLENOL) suppository 650 mg  650 mg Rectal Q4H PRN Layne Benton, NP      . amLODipine (NORVASC) tablet 10 mg  10 mg Per Tube Daily Annie Main L, NP   10 mg at 02/21/18 0900  . chlorhexidine (PERIDEX) 0.12 % solution 15 mL  15 mL Mouth Rinse BID Layne Benton, NP  15 mL at 02/21/18 0900  . Chlorhexidine Gluconate Cloth 2 % PADS 6 each  6 each Topical Q0600 Layne Benton, NP   6 each at 02/21/18 [phone]  . cloNIDine (CATAPRES) tablet 0.2 mg  0.2 mg Oral TID Annie Main L, NP   0.2 mg at 02/21/18 0900  . hydrALAZINE (APRESOLINE) injection 20 mg  20 mg Intravenous Q8H PRN Annie Main L, NP   20 mg at 02/21/18 0825  . insulin aspart (novoLOG) injection 0-15 Units  0-15 Units Subcutaneous Q4H Layne Benton, NP   2 Units at 02/21/18 0858  . labetalol (NORMODYNE) tablet 200 mg  200 mg Oral TID Annie Main L, NP   200 mg at 02/21/18 0900  . labetalol (NORMODYNE,TRANDATE) injection 20-40 mg  20-40 mg Intravenous Q2H PRN Layne Benton, NP   40 mg at 02/20/18 0445  . MEDLINE mouth rinse  15 mL Mouth Rinse q12n4p Annie Main L, NP   15 mL at 02/20/18 1521   . pantoprazole (PROTONIX) EC tablet 40 mg  40 mg Oral QHS Micki Riley, MD      . RESOURCE THICKENUP CLEAR   Oral PRN Layne Benton, NP      . senna-docusate (Senokot-S) tablet 1 tablet  1 tablet Oral BID Layne Benton, NP   1 tablet at 02/21/18 0901  . sodium bicarbonate tablet 650 mg  650 mg Per Tube TID Layne Benton, NP   650 mg at 02/21/18 0900  . sodium chloride 0.9 % bolus 250 mL  250 mL Intravenous Once Annie Main L, NP   250 mL at 02/17/18 1530  . sodium chloride flush (NS) 0.9 % injection 10-40 mL  10-40 mL Intracatheter Q12H Layne Benton, NP   10 mL at 02/20/18 2111  . sodium chloride flush (NS) 0.9 % injection 10-40 mL  10-40 mL Intracatheter PRN Layne Benton, NP        Allergies as of 02/16/2018 - Review Complete 02/16/2018  Allergen Reaction Noted  . Minocycline Anaphylaxis 04/19/2012  . Shellfish allergy Nausea And Vomiting 01/08/2014    Family History  Problem Relation Age of Onset  . Hypertension Mother   . Diabetes Mellitus II Father     Social History   Socioeconomic History  . Marital status: Married    Spouse name: Not on file  . Number of children: Not on file  . Years of education: Not on file  . Highest education level: Not on file  Occupational History  . Not on file  Social Needs  . Financial resource strain: Not on file  . Food insecurity:    Worry: Not on file    Inability: Not on file  . Transportation needs:    Medical: Not on file    Non-medical: Not on file  Tobacco Use  . Smoking status: Never Smoker  . Smokeless tobacco: Never Used  Substance and Sexual Activity  . Alcohol use: No    Comment: SOCIAL  . Drug use: No  . Sexual activity: Yes  Lifestyle  . Physical activity:    Days per week: Not on file    Minutes per session: Not on file  . Stress: Not on file  Relationships  . Social connections:    Talks on phone: Not on file    Gets together: Not on file    Attends religious service: Not on file    Active member  of club or organization: Not on file  Attends meetings of clubs or organizations: Not on file    Relationship status: Not on file  . Intimate partner violence:    Fear of current or ex partner: Not on file    Emotionally abused: Not on file    Physically abused: Not on file    Forced sexual activity: Not on file  Other Topics Concern  . Not on file  Social History Narrative  . Not on file    Review of Systems: Gen:  ++ , fatigue, ++ weakness, ++ malaise,   HEENT: No visual complaints, No history of Retinopathy. Normal external appearance No Epistaxis or Sore throat. No sinusitis.   CV: Denies chest pain, angina, palpitations, syncope, orthopnea, PND, peripheral edema, and claudication. Resp: Denies dyspnea at rest, dyspnea with exercise, cough, sputum, wheezing, coughing up blood, and pleurisy. GI: Denies vomiting blood, jaundice, and fecal incontinence.   Denies dysphagia or odynophagia. GU : Denies urinary burning, blood in urine, urinary frequency, urinary hesitancy, nocturnal urination, and urinary incontinence.  No renal calculi. MS: Denies joint pain, limitation of movement, and swelling, stiffness, low back pain, extremity pain. Denies muscle weakness, cramps, atrophy.  No use of non steroidal antiinflammatory drugs. Derm: Denies rash, itching, dry skin, hives, moles, warts, or unhealing ulcers.  Psych: Denies depression, anxiety, memory loss, suicidal ideation, hallucinations, paranoia, and confusion. Heme: Denies bruising, bleeding, and enlarged lymph nodes. Neuro basal gangilon bleed  Endocrine  DM.  No Thyroid disease.  No Adrenal disease.  Physical Exam: Vital signs in last 24 hours: Temp:  [97.8 F (36.6 C)-98.7 F (37.1 C)] 98.2 F (36.8 C) (03/22 0815) Pulse Rate:  [71-75] 72 (03/22 0815) Resp:  [14-19] 19 (03/22 0815) BP: (160-214)/(69-99) 214/99 (03/22 0815) SpO2:  [95 %-99 %] 96 % (03/22 0815) Weight:  [395 lb 4.6 oz (179.3 kg)] 395 lb 4.6 oz (179.3 kg)  (03/22 0815) Last BM Date: 02/21/18 General: Obese and ill appearing  Head:  Normocephalic and atraumatic. Eyes:  Sclera clear, no icterus.   Conjunctiva pink. Ears:  Normal auditory acuity. Nose:  No deformity, discharge,  or lesions. Mouth:  No deformity or lesions, dentition normal. Neck:  Supple; no masses or thyromegaly. JVP not elevated Lungs:  Clear throughout to auscultation.   No wheezes, crackles, or rhonchi. No acute distress. Heart:  Regular rate and rhythm; no murmurs, clicks, rubs,  or gallops. Abdomen:  Soft, nontender and nondistended. No masses, hepatosplenomegaly or hernias noted. Normal bowel sounds, without guarding, and without rebound.   Msk:  Symmetrical without gross deformities. Normal posture. Pulses:  No carotid, renal, femoral bruits. DP and PT symmetrical and equal Extremities:  Without clubbing or edema. Neurologic:  Alert and  oriented x4;  grossly normal neurologically.    Intake/Output from previous day: 03/21 0701 - 03/22 0700 In: 392 [P.O.:350; I.V.:42] Out: 1515 [Urine:1515] Intake/Output this shift: Total I/O In: -  Out: 350 [Urine:350]  Lab Results: Recent Labs    02/19/18 0446 02/20/18 0416  WBC 7.3 6.6  HGB 8.7* 9.0*  HCT 28.2* 29.6*  PLT 237 229   BMET Recent Labs    02/19/18 0446  02/20/18 0416 02/20/18 1145 02/21/18 0401  NA 149*   < > 150* 148* 150*  K 5.1  --  5.9*  --  5.1  CL 123*  --  126*  --  122*  CO2 18*  --  17*  --  18*  GLUCOSE 123*  --  153*  --  135*  BUN 51*  --  51*  --  48*  CREATININE 5.26*  --  5.20*  --  5.15*  CALCIUM 8.1*  --  8.3*  --  8.6*  PHOS 6.8*  --   --   --   --    < > = values in this interval not displayed.   LFT No results for input(s): PROT, ALBUMIN, AST, ALT, ALKPHOS, BILITOT, BILIDIR, IBILI in the last 72 hours. PT/INR No results for input(s): LABPROT, INR in the last 72 hours. Hepatitis Panel No results for input(s): HEPBSAG, HCVAB, HEPAIGM, HEPBIGM in the last 72  hours.  Studies/Results: No results found.  Assessment/Plan:   New ESRD  Will have VVS place access  Will also need CLIP   HTN will follow will remove fluid on dialysis  Metabolic acidosis should correct with dialysis  Bones  Phos increase   phoslo 2 with meals  Check PTH  Anemia  Check iron stores    LOS: 5 Ryin Schillo W @TODAY @12 :20 PM

## 2018-02-21 NOTE — Clinical Social Work Note (Signed)
Clinical Social Work Assessment  Patient Details  Name: [name] [name] MRN: [id number] Date of Birth: May 14, 1966  Date of referral:  02/21/18               Reason for consult:  Facility Placement                Permission sought to share information with:  Family Supports Permission granted to share information::  Yes, Verbal Permission Granted  Name::     Set designerKendra  Agency::     Relationship::  e  Contact Information:     Housing/Transportation Living arrangements for the past 2 months:  Single Family Home Source of Information:  Patient Patient Interpreter Needed:  None Criminal Activity/Legal Involvement Pertinent to Current Situation/Hospitalization:  No - Comment as needed Significant Relationships:  Spouse Lives with:  Spouse Do you feel safe going back to the place where you live?  No Need for family participation in patient care:  No (Coment)  Care giving concerns:  Pt was independent prior to admission. Pt lives with spouse.    Social Worker assessment / plan:  CSW spoke with pt at bedside to complete initial assessment. Pt is agreeable to SNF at d/c. Pt is not familiar with SNF and has never been to one. [facility] Pt will need to be clipped prior to placement. CSW will follow up with b/o.  Employment status:  [employer] Insurance information:  Managed Medicare PT Recommendations:  Skilled Nursing Facility Information / Referral to community resources:  Skilled Nursing Facility  Patient/Family's Response to care:  Pt verbalized understanding of CSW role and expressed appreciation for support. Pt denies any concern regarding pt care at this time.   Patient/Family's Understanding of and Emotional Response to Diagnosis, Current Treatment, and Prognosis:  Pt understanding and realistic regarding physical limitations. Pt understands the need for SNF placement at d/c. Pt agreeable to SNF placement at d/c, at this time. Pt's responses emotionally  appropriate during conversation with CSW. Pt denies any concern regarding treatment plan at this time. CSW will continue to provide support and facilitate d/c needs.   Emotional Assessment Appearance:  Appears stated age Attitude/Demeanor/Rapport:  (Patient was appropriate) Affect (typically observed):  Accepting, Appropriate, Calm Orientation:  Oriented to Self, Oriented to Place, Oriented to  Time, Oriented to Situation Alcohol / Substance use:  Not Applicable Psych involvement (Current and /or in the community):  No (Comment)  Discharge Needs  Concerns to be addressed:  Basic Needs, Care Coordination Readmission within the last 30 days:  No Current discharge risk:  Dependent with Mobility Barriers to Discharge:  Continued Medical Work up   Pacific MutualBridget A Kaylynne Andres, LCSW 02/21/2018, 3:45 PM

## 2018-02-22 LAB — GLUCOSE, CAPILLARY
GLUCOSE-CAPILLARY: 164 mg/dL — AB (ref 65–99)
Glucose-Capillary: 104 mg/dL — ABNORMAL HIGH (ref 65–99)
Glucose-Capillary: 123 mg/dL — ABNORMAL HIGH (ref 65–99)
Glucose-Capillary: 125 mg/dL — ABNORMAL HIGH (ref 65–99)
Glucose-Capillary: 134 mg/dL — ABNORMAL HIGH (ref 65–99)
Glucose-Capillary: 154 mg/dL — ABNORMAL HIGH (ref 65–99)

## 2018-02-22 LAB — CBC
HEMATOCRIT: 29.6 % — AB (ref 39.0–52.0)
Hemoglobin: 9.1 g/dL — ABNORMAL LOW (ref 13.0–17.0)
MCH: 25.9 pg — ABNORMAL LOW (ref 26.0–34.0)
MCHC: 30.7 g/dL (ref 30.0–36.0)
MCV: 84.3 fL (ref 78.0–100.0)
Platelets: 208 10*3/uL (ref 150–400)
RBC: 3.51 MIL/uL — ABNORMAL LOW (ref 4.22–5.81)
RDW: 16.7 % — AB (ref 11.5–15.5)
WBC: 6.8 10*3/uL (ref 4.0–10.5)

## 2018-02-22 LAB — BASIC METABOLIC PANEL
Anion gap: 12 (ref 5–15)
BUN: 45 mg/dL — AB (ref 6–20)
CALCIUM: 8.4 mg/dL — AB (ref 8.9–10.3)
CO2: 19 mmol/L — AB (ref 22–32)
CREATININE: 4.96 mg/dL — AB (ref 0.61–1.24)
Chloride: 119 mmol/L — ABNORMAL HIGH (ref 101–111)
GFR calc non Af Amer: 12 mL/min — ABNORMAL LOW (ref >60)
GFR, EST AFRICAN AMERICAN: 14 mL/min — AB (ref >60)
GLUCOSE: 123 mg/dL — AB (ref 65–99)
Potassium: 4.9 mmol/L (ref 3.5–5.1)
Sodium: 150 mmol/L — ABNORMAL HIGH (ref 135–145)

## 2018-02-22 LAB — PARATHYROID HORMONE, INTACT (NO CA): PTH: 60 pg/mL (ref 15–65)

## 2018-02-22 MED ORDER — CEFAZOLIN SODIUM-DEXTROSE 1-4 GM/50ML-% IV SOLN: 1.0000 g | INTRAVENOUS | Status: DC

## 2018-02-22 MED ORDER — CLONIDINE HCL 0.1 MG PO TABS
0.3000 mg | ORAL_TABLET | Freq: Three times a day (TID) | ORAL | Status: DC
  Administered 2018-02-22 – 2018-03-01 (×21): 0.3 mg via ORAL
  Filled 2018-02-22 (×22): qty 3

## 2018-02-22 MED ORDER — LABETALOL HCL 200 MG PO TABS
300.0000 mg | ORAL_TABLET | Freq: Three times a day (TID) | ORAL | Status: DC
  Administered 2018-02-22 – 2018-03-06 (×35): 300 mg via ORAL
  Filled 2018-02-22 (×35): qty 1

## 2018-02-22 MED ORDER — CEFAZOLIN SODIUM-DEXTROSE 2-4 GM/100ML-% IV SOLN: 2.0000 g | INTRAVENOUS | Status: DC

## 2018-02-22 NOTE — Progress Notes (Signed)
STROKE TEAM PROGRESS NOTE   SUBJECTIVE (INTERVAL HISTORY) No family at bedside. Patient feels stable, no changes overnight.  Sitting in bed for lunch.  Vascular surgery planning for permacath placement for hemodialysis.  Vascular surgery and nephrology consultation appreciated.  Patient still has left hemiplegia.  BP still high, increase BP meds.   OBJECTIVE Vitals:   02/22/18 0400 02/22/18 0730 02/22/18 1132 02/22/18 1150  BP: (!) 181/91 (!) 196/97 (!) 198/97 (!) 189/97  Pulse: 73 76 71   Resp: 18 18 18    Temp: 98 F (36.7 C) 98.2 F (36.8 C) 98.1 F (36.7 C)   TempSrc: Axillary Oral Oral   SpO2: 97% 100% 100%   Weight:      Height:        CBC:  Recent Labs  Lab 02/19/18 0446 02/20/18 0416 02/22/18 0658  WBC 7.3 6.6 6.8  NEUTROABS 4.9 4.1  --   HGB 8.7* 9.0* 9.1*  HCT 28.2* 29.6* 29.6*  MCV 85.5 85.3 84.3  PLT 237 229 208    Basic Metabolic Panel:  Recent Labs  Lab 02/17/18 0459  02/18/18 0502  02/19/18 0446  02/21/18 0401 02/22/18 0658  NA 138   < > 141   < > 149*   < > 150* 150*  K 5.0  --  5.1  --  5.1   < > 5.1 4.9  CL 112*  --  113*  --  123*   < > 122* 119*  CO2 18*  --  17*  --  18*   < > 18* 19*  GLUCOSE 186*  --  128*  --  123*   < > 135* 123*  BUN 43*  --  50*  --  51*   < > 48* 45*  CREATININE 4.09*  --  5.01*  --  5.26*   < > 5.15* 4.96*  CALCIUM 8.5*  --  8.2*  --  8.1*   < > 8.6* 8.4*  MG 2.2  --  2.3  --   --   --   --   --   PHOS 6.0*  --  6.7*  --  6.8*  --   --   --    < > = values in this interval not displayed.   Dg Chest 1 View  Result Date: 02/16/2018 CLINICAL DATA:  52 year old male found down. Suspected fall. Basal ganglia cerebral hemorrhage. EXAM: CHEST  1 VIEW COMPARISON:  Chest radiographs 10/22/2017. FINDINGS: Portable AP semi upright view at 1801 hours. Lower lung volumes. Confluent retrocardiac opacity obscuring the left hemidiaphragm. Stable mild cardiomegaly. Other mediastinal contours are within normal limits. Visualized  tracheal air column is within normal limits. The right lung appears clear allowing for portable technique. IMPRESSION: 1. Confluent left lower lobe opacity compatible with collapse or consolidation. 2. Right lung ventilation is within normal limits. 3. Chronic cardiomegaly. Electronically Signed   By: Odessa Fleming M.D.   On: 02/16/2018 19:14   Dg Pelvis 1-2 Views  Result Date: 02/16/2018 CLINICAL DATA:  52 year old male found down. Suspected fall. Basal ganglia cerebral hemorrhage. EXAM: PELVIS - 1-2 VIEW COMPARISON:  CT Abdomen and Pelvis 03/28/2017. FINDINGS: Femoral heads are normally located. Hip joint spaces remain normal. The proximal femurs appear intact. The pelvis appears stable and intact. Bone mineralization is within normal limits. Negative visible bowel gas pattern. IMPRESSION: No acute fracture or dislocation identified about the pelvis. Electronically Signed   By: Odessa Fleming M.D.   On: 02/16/2018 19:15  Ct Head Wo Contrast  Result Date: 02/17/2018 CLINICAL DATA:  Follow-up examination for intracranial hemorrhage. EXAM: CT HEAD WITHOUT CONTRAST TECHNIQUE: Contiguous axial images were obtained from the base of the skull through the vertex without intravenous contrast. COMPARISON:  Prior CT from 02/16/2018. FINDINGS: Brain: Intraparenchymal hematoma centered at the right basal ganglia again seen overall not significantly changed in size measuring 28 mL in size (previously 28 mL). Surrounding low-density vasogenic edema with regional mass effect is relatively similar. Right lateral ventricle is largely effaced. Associated right-to-left shift measures up to 7 mm, little interval change. No hydrocephalus or ventricular trapping. No discernible intraventricular extension of hemorrhage. No new intracranial hemorrhage. No other acute large vessel territory infarct. Underlying atrophy with chronic small vessel ischemic disease and remote right PCA territory infarct noted. No extra-axial fluid collection.  Vascular: No hyperdense vessel. Scattered vascular calcifications noted within the carotid siphons. Skull: Evolving left frontotemporal scalp contusion with overlying soft tissue edema. Calvarium intact. Sinuses/Orbits: Globes and orbital soft tissues within normal limits. Mild layering opacity within the left fronto ethmoidal sinus. Paranasal sinuses are otherwise clear. Nasogastric tube in place. No mastoid effusion. Other: None. IMPRESSION: 1. No significant interval change in size and appearance of right basal ganglia intraparenchymal hemorrhage, estimated volume 28 mL. Regional mass effect with localized 7 mm right-to-left shift relatively stable. No hydrocephalus or ventricular trapping. 2. Evolving left frontotemporal scalp contusion. 3. No other acute intracranial abnormality. 4. Underlying atrophy with chronic small vessel ischemic disease and remote right PCA territory infarct. Electronically Signed   By: Rise Mu M.D.   On: 02/17/2018 01:37   Ct Head Wo Contrast  Addendum Date: 02/16/2018   ADDENDUM REPORT: 02/16/2018 18:43 ADDENDUM: Critical Value/emergent results were called by telephone at the time of interpretation on 02/16/2018 at 1824 hours. to Dr. Chaney Malling , who verbally acknowledged these results. Electronically Signed   By: Odessa Fleming M.D.   On: 02/16/2018 18:43   Result Date: 02/16/2018 CLINICAL DATA:  52 year old male found down. EXAM: CT HEAD WITHOUT CONTRAST CT CERVICAL SPINE WITHOUT CONTRAST TECHNIQUE: Multidetector CT imaging of the head and cervical spine was performed following the standard protocol without intravenous contrast. Multiplanar CT image reconstructions of the cervical spine were also generated. COMPARISON:  None. FINDINGS: Study is mildly degraded by motion artifact despite repeated imaging attempts. CT HEAD FINDINGS Brain: Oval hyperdense intra-axial hemorrhage centered at the right basal ganglia encompassing 57 x 30 x 33 millimeters (AP by transverse by CC)  for an estimated blood volume of 28 milliliters. Surrounding edema. Mass effect with leftward midline shift of 8-9 millimeters. Effaced right lateral ventricle, but no intraventricular hemorrhage. No ventriculomegaly. No other acute intracranial hemorrhage identified. The basilar cisterns are normal. Chronic appearing encephalomalacia in the right occipital pole. Confluent bilateral cerebral white matter hypodensity. Vascular: Degraded by motion, No suspicious intracranial vascular hyperdensity. Skull: Bone detail limited by motion, no acute osseous abnormality identified. Sinuses/Orbits: Clear. Other: Large generalized left side scalp and face superficial soft tissue hematoma measuring up to 11 millimeters in thickness. The forehead is affected. The findings continue along the lateral and anterior left orbit. Grossly negative orbits soft tissues, degraded by motion. The right scalp appears relatively spared. CT CERVICAL SPINE FINDINGS Alignment: Straightening and mild reversal of cervical lordosis. Cervicothoracic junction alignment is within normal limits. Bilateral posterior element alignment is within normal limits. Skull base and vertebrae: Lower cervical spine bone detail is decreased by large body habitus. Visualized skull base is intact. No atlanto-occipital dissociation.  No cervical spine fracture identified. Soft tissues and spinal canal: There is a retropharyngeal course of both carotid arteries in the neck. Grossly negative noncontrast deep paraspinal soft tissues. Disc levels: There is broad-based right foraminal disc osteophyte degeneration at C3-C4 with severe right C4 foraminal stenosis. No other significant cervical spine degeneration is evident. Upper chest: The visible T1 level appears intact. IMPRESSION: 1. Acute right basal ganglia hemorrhage with estimated blood volume of 28 mL. Underlying chronic cerebral ischemia, including previous right PCA infarct. 2. Surrounding edema with regional mass  effect including leftward midline shift up to 9 mm. Basilar cisterns are patent. 3. Subtotal effacement of the right lateral ventricle, but no intraventricular extension of blood and no ventriculomegaly. 4. Large left scalp hematoma. No underlying skull fracture identified. 5.  No acute fracture in the cervical spine. Electronically Signed: By: Odessa FlemingH  Hall M.D. On: 02/16/2018 18:23   Ct Cervical Spine Wo Contrast  Addendum Date: 02/16/2018   ADDENDUM REPORT: 02/16/2018 18:43 ADDENDUM: Critical Value/emergent results were called by telephone at the time of interpretation on 02/16/2018 at 1824 hours. to Dr. Chaney MallingAVID YAO , who verbally acknowledged these results. Electronically Signed   By: Odessa FlemingH  Hall M.D.   On: 02/16/2018 18:43   Result Date: 02/16/2018 CLINICAL DATA:  52 year old male found down. EXAM: CT HEAD WITHOUT CONTRAST CT CERVICAL SPINE WITHOUT CONTRAST TECHNIQUE: Multidetector CT imaging of the head and cervical spine was performed following the standard protocol without intravenous contrast. Multiplanar CT image reconstructions of the cervical spine were also generated. COMPARISON:  None. FINDINGS: Study is mildly degraded by motion artifact despite repeated imaging attempts. CT HEAD FINDINGS Brain: Oval hyperdense intra-axial hemorrhage centered at the right basal ganglia encompassing 57 x 30 x 33 millimeters (AP by transverse by CC) for an estimated blood volume of 28 milliliters. Surrounding edema. Mass effect with leftward midline shift of 8-9 millimeters. Effaced right lateral ventricle, but no intraventricular hemorrhage. No ventriculomegaly. No other acute intracranial hemorrhage identified. The basilar cisterns are normal. Chronic appearing encephalomalacia in the right occipital pole. Confluent bilateral cerebral white matter hypodensity. Vascular: Degraded by motion, No suspicious intracranial vascular hyperdensity. Skull: Bone detail limited by motion, no acute osseous abnormality identified.  Sinuses/Orbits: Clear. Other: Large generalized left side scalp and face superficial soft tissue hematoma measuring up to 11 millimeters in thickness. The forehead is affected. The findings continue along the lateral and anterior left orbit. Grossly negative orbits soft tissues, degraded by motion. The right scalp appears relatively spared. CT CERVICAL SPINE FINDINGS Alignment: Straightening and mild reversal of cervical lordosis. Cervicothoracic junction alignment is within normal limits. Bilateral posterior element alignment is within normal limits. Skull base and vertebrae: Lower cervical spine bone detail is decreased by large body habitus. Visualized skull base is intact. No atlanto-occipital dissociation. No cervical spine fracture identified. Soft tissues and spinal canal: There is a retropharyngeal course of both carotid arteries in the neck. Grossly negative noncontrast deep paraspinal soft tissues. Disc levels: There is broad-based right foraminal disc osteophyte degeneration at C3-C4 with severe right C4 foraminal stenosis. No other significant cervical spine degeneration is evident. Upper chest: The visible T1 level appears intact. IMPRESSION: 1. Acute right basal ganglia hemorrhage with estimated blood volume of 28 mL. Underlying chronic cerebral ischemia, including previous right PCA infarct. 2. Surrounding edema with regional mass effect including leftward midline shift up to 9 mm. Basilar cisterns are patent. 3. Subtotal effacement of the right lateral ventricle, but no intraventricular extension of blood and no ventriculomegaly.  4. Large left scalp hematoma. No underlying skull fracture identified. 5.  No acute fracture in the cervical spine. Electronically Signed: By: Odessa Fleming M.D. On: 02/16/2018 18:23   US Renal  Result Date: 02/18/2018 CLINICAL DATA:  52 year old hypertensive diabetic male with chronic kidney disease. Initial encounter. EXAM: RENAL / URINARY TRACT ULTRASOUND COMPLETE  COMPARISON:  12/16/2017 renal sonogram. FINDINGS: Right Kidney: Length: 14.3 cm. Echogenicity within normal limits. No mass or hydronephrosis visualized. Left Kidney: Length: 14 cm. Echogenicity within normal limits. No hydronephrosis. Left lower pole 9.8 mm nonobstructing stone. Bladder: Decompressed by Foley catheter and therefore not evaluated. IMPRESSION: No hydronephrosis. Left lower pole 9.8 mm nonobstructing stone. Electronically Signed   By: Lacy Duverney M.D.   On: 02/18/2018 10:00   Dg Chest Port 1 View  Result Date: 02/16/2018 CLINICAL DATA:  Central catheter placement EXAM: PORTABLE CHEST 1 VIEW COMPARISON:  Study obtained earlier in the day FINDINGS: Central catheter tip is in the superior vena cava. No pneumothorax. There is no appreciable edema or consolidation. There is stable cardiomegaly. Pulmonary vascular is normal. No adenopathy. No bone lesions. IMPRESSION: Central catheter tip in superior vena cava. No pneumothorax. Stable cardiomegaly. No edema or consolidation. Electronically Signed   By: Bretta Bang III M.D.   On: 02/16/2018 23:42   Dg Chest Port 1 View  Result Date: 02/16/2018 CLINICAL DATA:  Acute respiratory failure with hypoxia. EXAM: PORTABLE CHEST 1 VIEW COMPARISON:  Radiograph earlier this day. FINDINGS: Unchanged cardiomegaly and mediastinal contours. Unchanged retrocardiac opacity. Mild bronchial thickening. No new focal airspace disease. No pneumothorax. IMPRESSION: 1. Unchanged retrocardiac opacity which may be combination of pleural fluid and atelectasis or airspace disease. 2. Unchanged cardiomegaly. 3. Mild central bronchial thickening. Electronically Signed   By: Rubye Oaks M.D.   On: 02/16/2018 22:00   Dg Abd Portable 1v  Result Date: 02/17/2018 CLINICAL DATA:  NG tube advancement. EXAM: PORTABLE ABDOMEN - 1 VIEW COMPARISON:  2 hours prior. FINDINGS: The enteric tube has been advanced with tip and side-port below the diaphragm in the stomach. No bowel  dilatation in the visualized upper abdomen. IMPRESSION: Tip and side port of the enteric tube below the diaphragm in the stomach. Electronically Signed   By: Rubye Oaks M.D.   On: 02/17/2018 03:37   Dg Abd Portable 1v  Result Date: 02/17/2018 CLINICAL DATA:  NG tube advancement. EXAM: PORTABLE ABDOMEN - 1 VIEW COMPARISON:  1 hour prior. FINDINGS: Minimal advancement of the enteric tube, tip in the region of the distal esophagus. Advancement of an additional 10 cm is recommended to place the side-port below the diaphragm. IMPRESSION: Tip of the enteric tube remains in the region of the distal esophagus, recommend advancement of an additional 10 cm to place the side-port below the diaphragm. Electronically Signed   By: Rubye Oaks M.D.   On: 02/17/2018 02:08   Dg Abd Portable 1v  Result Date: 02/17/2018 CLINICAL DATA:  NG tube placement. EXAM: PORTABLE ABDOMEN - 1 VIEW COMPARISON:  None. FINDINGS: Tip of the enteric tube in the distal esophagus, recommend advancement of at least 10 cm to place the side-port below the diaphragm. Cardiomegaly is partially included. IMPRESSION: Tip of the enteric tube in the distal esophagus, recommend advancement of least 10 cm to place the side-port below the diaphragm. Electronically Signed   By: Rubye Oaks M.D.   On: 02/17/2018 01:01    Physical exam:  Vitals:   02/22/18 0400 02/22/18 0730 02/22/18 1132 02/22/18 1150  BP: Marland Kitchen)  181/91 (!) 196/97 (!) 198/97 (!) 189/97  Pulse: 73 76 71   Resp: 18 18 18    Temp: 98 F (36.7 C) 98.2 F (36.8 C) 98.1 F (36.7 C)   TempSrc: Axillary Oral Oral   SpO2: 97% 100% 100%   Weight:      Height:        Exam: Gen: NAD, conversant, well nourised, morbidly obese, well groomed                     CV: RRR, no MRG. No Carotid Bruits. No peripheral edema, warm, nontender Eyes: Conjunctivae clear without exudates or hemorrhage  Neuro: Detailed Neurologic Exam  Speech:    Speech is dysarthric with normal  comprehension.  Cognition:    The patient is oriented to person, month and year Cranial Nerves:    The pupils are equal, round, and reactive to light.  Visual fields are full to finger confrontation. Right gaze preference but can cross the midline. Left lower face weakness. Left lower qudrantanopia.  Hearing intact. Voice is normal. Shoulder shrug is normal.   Coordination:    No dysmetria  Motor Observation:  no involuntary movements noted. Tone:    Decreased muscle tone left.      Strength and sensation    Dense left hemiparesis and hemisensory loss, withdraws minimally on left     Reflex Exam: Toes: Left upgoing   Clonus:    Clonus is absent.    ASSESSMENT/PLAN Mr. HALIM SURRETTE is a 52 y.o. male with history of hypertension, chronic kidney disease, diabetes, and right transmetatarsal amputation presenting with headache, nausea, and elevated blood pressure. He did not receive IV t-PA due to ICH.  Right BG ICH secondary to uncontrolled hypertension in the setting of CKD stage V  Resultant  Right gaze preference, R field cut, L hemiplegia, dysphagia  CT head 02/16/18  Acute right basal ganglia hemorrhage with estimated blood volume of 28 mL.  CT head 02/17/18 R BG IPH w/ 7mm R to L shift. Evolving L frontotemporal scalp contusion. Small vessel disease. Atrophy. 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  2D Echo - Decreased EF 45-50%. Diffuse hypokinesis.  Renal US - No hydronephrosis. Left lower pole 9.8 mm nonobstructing stone.  LDL - 108   HgbA1c - 6.4  VTE prophylaxis - SCDs d/c'd, added lovenox (safe as hmg now stable) Fall precautions DIET - DYS 1 Room service appropriate? Yes; Fluid consistency: Nectar Thick Diet NPO time specified Except for: Sips with Meds. ST following  No antithrombotic prior to admission, now on No antithrombotic d/t hmg  Ongoing aggressive stroke risk factor management  Therapy recommendations:    Initially CIR but working wife unable to provide 24/7. SNF now recommended. SW consulted. FL2 signed  Disposition: pending  Induced hypernatremia for Cerebral Edema  Treated with hypertonic saline, now off  sodium 150  Let Na drift down, do not aggressively lower  Continue to monitor  Hypertension  BP on arrival 251/158. Initially treated with cardene, then changed to cleviprex. Drip now off.  SBP goal < 180  PTA meds: norvasc 10, lasix 40, labetalol 200 bid  Now on:  norvasc 10, clonidine 0.3 tid, labetolol 300 bid  Monitor BP  Long-term BP goal normotensive  CKD stage V  Chronic kidney disease -gentle hydration.   Elevated creatinine on bicarb supplement.   Nephrology consulted by [facility] plan is for dialysis per Dr. Hyman Hopes, appreciate help  VVS to put in permacath, appreciate help  Likely HD on Monday   Hyperlipidemia  Lipid lowering medication PTA:  none  LDL 108  Consider statin at discharge   Type II Diabetes  HgbA1c 6.4, at goal < 7.0  CBGs 130-153  Controlled  SSI  Other Stroke Risk Factors  Morbid Obesity, Body mass index is 50.75 kg/m., recommend weight loss, diet and exercise as appropriate   Hx Stroke:  Previous right PCA infarct by imaging.  Hx diastolic dysfuction. No CHF  Other Active Problems  Anemia 9.0 - likely due to CKD  CXR 02/16/18-  Confluent LLL opacity compatible with collapse or consolidation. - repeat CXR in am  Hospital day # 6  Marvel Plan, MD PhD Stroke Neurology 02/22/2018 6:08 PM    To contact Stroke Continuity provider, please refer to [email or web address]. After hours, contact General Neurology

## 2018-02-22 NOTE — Progress Notes (Signed)
[name] ROUNDING NOTE   Subjective:   52 year old with uncontrolled hypertension and diabetes and progressive renal failure    He presented with acute basal ganglion hemorrhage and was found to have progressed to stage 5 chronic renal disease and will need dialysis  Appreciate assistance from Dr Darrick PennaFields  He has a Right field cut and left hemiplegia    As a result of the bleed  He has a decreased EF  45 %  Diffuse hypokinesia    Objective:  Vital signs in last 24 hours:  Temp:  [97.8 F (36.6 C)-98.6 F (37 C)] 98.2 F (36.8 C) (03/23 0730) Pulse Rate:  [70-76] 76 (03/23 0730) Resp:  [18-20] 18 (03/23 0730) BP: (157-205)/(70-101) 196/97 (03/23 0730) SpO2:  [97 %-100 %] 100 % (03/23 0730)  Weight change:  Filed Weights   02/17/18 0400 02/21/18 0815  Weight: (!) 387 lb 9.1 oz (175.8 kg) (!) 395 lb 4.6 oz (179.3 kg)    Intake/Output: I/O last 3 completed shifts: In: -  Out: 2150 [Urine:2150]   Intake/Output this shift:  No intake/output data recorded.  CVS- RRR RS- CTA ABD- BS present soft non-distended EXT-  Anasarca  Lower extremities      Basic Metabolic Panel: Recent Labs  Lab 02/17/18 0459  02/18/18 0502  02/19/18 0446  02/19/18 2219 02/20/18 0416 02/20/18 1145 02/21/18 0401 02/22/18 0658  NA 138   < > 141   < > 149*   < > 149* 150* 148* 150* 150*  K 5.0  --  5.1  --  5.1  --   --  5.9*  --  5.1 4.9  CL 112*  --  113*  --  123*  --   --  126*  --  122* 119*  CO2 18*  --  17*  --  18*  --   --  17*  --  18* 19*  GLUCOSE 186*  --  128*  --  123*  --   --  153*  --  135* 123*  BUN 43*  --  50*  --  51*  --   --  51*  --  48* 45*  CREATININE 4.09*  --  5.01*  --  5.26*  --   --  5.20*  --  5.15* 4.96*  CALCIUM 8.5*  --  8.2*  --  8.1*  --   --  8.3*  --  8.6* 8.4*  MG 2.2  --  2.3  --   --   --   --   --   --   --   --   PHOS 6.0*  --  6.7*  --  6.8*  --   --   --   --   --   --    < > = values in this interval not displayed.     Liver Function Tests: Recent Labs  Lab 02/16/18 1714  AST 17  ALT 20  ALKPHOS 93  BILITOT 1.0  PROT 7.6  ALBUMIN 3.0*   No results for input(s): LIPASE, AMYLASE in the last 168 hours. No results for input(s): AMMONIA in the last 168 hours.  CBC: Recent Labs  Lab 02/16/18 1714  02/17/18 0459 02/18/18 0502 02/19/18 0446 02/20/18 0416 02/22/18 0658  WBC 8.6  --  11.1* 11.9* 7.3 6.6 6.8  NEUTROABS 6.9  --   --  9.0* 4.9 4.1  --   HGB 10.5*   < > 9.2*  8.9* 8.7* 9.0* 9.1*  HCT 33.2*   < > 29.4* 28.2* 28.2* 29.6* 29.6*  MCV 84.1  --  82.8 82.2 85.5 85.3 84.3  PLT 331  --  275 268 237 229 208   < > = values in this interval not displayed.    Cardiac Enzymes: Recent Labs  Lab 02/16/18 1714  CKTOTAL 287    BNP: Invalid input(s): POCBNP  CBG: Recent Labs  Lab 02/21/18 1536 02/21/18 2015 02/22/18 0010 02/22/18 0422 02/22/18 0723  GLUCAP 152* 155* 123* 104* 125*    Microbiology: Results for orders placed or performed during the hospital encounter of 02/16/18  MRSA PCR Screening     Status: None   Collection Time: 02/16/18  8:52 PM  Result Value Ref Range Status   MRSA by PCR NEGATIVE NEGATIVE Final    Comment:        The GeneXpert MRSA Assay (FDA approved for NASAL specimens only), is one component of a comprehensive MRSA colonization surveillance program. It is not intended to diagnose MRSA infection nor to guide or monitor treatment for MRSA infections. Performed at [facility], [address], [place], [place] [address]     Coagulation Studies: No results for input(s): LABPROT, INR in the last 72 hours.  Urinalysis: No results for input(s): COLORURINE, LABSPEC, PHURINE, GLUCOSEU, HGBUR, BILIRUBINUR, KETONESUR, PROTEINUR, UROBILINOGEN, NITRITE, LEUKOCYTESUR in the last 72 hours.  Invalid input(s): APPERANCEUR    Imaging: No results found.   Medications:   . sodium chloride    . sodium chloride     .  stroke: mapping our  early stages of recovery book   Does not apply Once  . amLODipine  10 mg Per Tube Daily  . calcium acetate  1,334 mg Oral TID WC  . chlorhexidine  15 mL Mouth Rinse BID  . Chlorhexidine Gluconate Cloth  6 each Topical Q0600  . cloNIDine  0.3 mg Oral TID  . insulin aspart  0-15 Units Subcutaneous Q4H  . labetalol  300 mg Oral TID  . mouth rinse  15 mL Mouth Rinse q12n4p  . pantoprazole  40 mg Oral QHS  . senna-docusate  1 tablet Oral BID  . sodium bicarbonate  650 mg Per Tube TID  . sodium chloride flush  10-40 mL Intracatheter Q12H   sodium chloride, acetaminophen **OR** acetaminophen (TYLENOL) oral liquid 160 mg/5 mL **OR** acetaminophen, hydrALAZINE, labetalol, RESOURCE THICKENUP CLEAR, sodium chloride flush  Assessment/ Plan:   New ESRD  Appreciate assistance of Dr Darrick Penna   Will also need CLIP   HTN will follow will remove fluid on dialysis  Metabolic acidosis should correct with dialysis  Bones  Phos increase   phoslo 2 with meals     PTH 60   Anemia  iron sats  Low will give IV iron        LOS: 6 [name] @TODAY @9 :57 AM

## 2018-02-23 ENCOUNTER — Inpatient Hospital Stay (HOSPITAL_COMMUNITY): Payer: 59

## 2018-02-23 DIAGNOSIS — E785 Hyperlipidemia, unspecified: Secondary | ICD-10-CM

## 2018-02-23 DIAGNOSIS — E1159 Type 2 diabetes mellitus with other circulatory complications: Secondary | ICD-10-CM

## 2018-02-23 DIAGNOSIS — Z794 Long term (current) use of insulin: Secondary | ICD-10-CM

## 2018-02-23 LAB — BASIC METABOLIC PANEL
Anion gap: 10 (ref 5–15)
BUN: 45 mg/dL — ABNORMAL HIGH (ref 6–20)
CHLORIDE: 122 mmol/L — AB (ref 101–111)
CO2: 20 mmol/L — ABNORMAL LOW (ref 22–32)
Calcium: 8.8 mg/dL — ABNORMAL LOW (ref 8.9–10.3)
Creatinine, Ser: 4.92 mg/dL — ABNORMAL HIGH (ref 0.61–1.24)
GFR calc Af Amer: 15 mL/min — ABNORMAL LOW (ref >60)
GFR, EST NON AFRICAN AMERICAN: 13 mL/min — AB (ref >60)
Glucose, Bld: 125 mg/dL — ABNORMAL HIGH (ref 65–99)
Potassium: 5 mmol/L (ref 3.5–5.1)
SODIUM: 152 mmol/L — AB (ref 135–145)

## 2018-02-23 LAB — CBC
HCT: 30.5 % — ABNORMAL LOW (ref 39.0–52.0)
HEMOGLOBIN: 9.3 g/dL — AB (ref 13.0–17.0)
MCH: 25.7 pg — ABNORMAL LOW (ref 26.0–34.0)
MCHC: 30.5 g/dL (ref 30.0–36.0)
MCV: 84.3 fL (ref 78.0–100.0)
Platelets: 195 10*3/uL (ref 150–400)
RBC: 3.62 MIL/uL — AB (ref 4.22–5.81)
RDW: 16.4 % — ABNORMAL HIGH (ref 11.5–15.5)
WBC: 6.1 10*3/uL (ref 4.0–10.5)

## 2018-02-23 LAB — GLUCOSE, CAPILLARY
GLUCOSE-CAPILLARY: 147 mg/dL — AB (ref 65–99)
Glucose-Capillary: 113 mg/dL — ABNORMAL HIGH (ref 65–99)
Glucose-Capillary: 120 mg/dL — ABNORMAL HIGH (ref 65–99)
Glucose-Capillary: 121 mg/dL — ABNORMAL HIGH (ref 65–99)
Glucose-Capillary: 131 mg/dL — ABNORMAL HIGH (ref 65–99)
Glucose-Capillary: 137 mg/dL — ABNORMAL HIGH (ref 65–99)

## 2018-02-23 MED ORDER — DEXTROSE 5 % IV SOLN
3.0000 g | INTRAVENOUS | Status: DC
  Filled 2018-02-23: qty 3000

## 2018-02-23 MED ORDER — SODIUM CHLORIDE 0.9 % IV SOLN
125.0000 mg | Freq: Every day | INTRAVENOUS | Status: AC
  Administered 2018-02-23 – 2018-03-02 (×8): 125 mg via INTRAVENOUS
  Filled 2018-02-23 (×15): qty 10

## 2018-02-23 MED ORDER — SODIUM BICARBONATE 650 MG PO TABS
650.0000 mg | ORAL_TABLET | Freq: Three times a day (TID) | ORAL | Status: DC
  Administered 2018-02-23 – 2018-02-26 (×10): 650 mg via ORAL
  Filled 2018-02-23 (×10): qty 1

## 2018-02-23 NOTE — Progress Notes (Signed)
STROKE TEAM PROGRESS NOTE   SUBJECTIVE (INTERVAL HISTORY) No family at bedside. Patient lying in bed without distress.  Vascular surgery planning for permacath placement Monday. Nephrology working on HD next week after permacath placed. Patient still has left hemiplegia.  BP still high, on BP meds, pending HD.   OBJECTIVE Vitals:   02/22/18 2219 02/23/18 0040 02/23/18 0333 02/23/18 0840  BP: (!) 200/101 (!) 183/88 (!) 187/93 (!) 186/87  Pulse: 69 68 66 69  Resp: 18 18 18 16   Temp:  98.1 F (36.7 C) 98.2 F (36.8 C) 97.9 F (36.6 C)  TempSrc:  Oral Oral Oral  SpO2: 99% 98% 99% 99%  Weight: (!) 399 lb 7.6 oz (181.2 kg)     Height:        CBC:  Recent Labs  Lab 02/19/18 0446 02/20/18 0416 02/22/18 0658 02/23/18 0515  WBC 7.3 6.6 6.8 6.1  NEUTROABS 4.9 4.1  --   --   HGB 8.7* 9.0* 9.1* 9.3*  HCT 28.2* 29.6* 29.6* 30.5*  MCV 85.5 85.3 84.3 84.3  PLT 237 229 208 195    Basic Metabolic Panel:  Recent Labs  Lab 02/17/18 0459  02/18/18 0502  02/19/18 0446  02/22/18 0658 02/23/18 0515  NA 138   < > 141   < > 149*   < > 150* 152*  K 5.0  --  5.1  --  5.1   < > 4.9 5.0  CL 112*  --  113*  --  123*   < > 119* 122*  CO2 18*  --  17*  --  18*   < > 19* 20*  GLUCOSE 186*  --  128*  --  123*   < > 123* 125*  BUN 43*  --  50*  --  51*   < > 45* 45*  CREATININE 4.09*  --  5.01*  --  5.26*   < > 4.96* 4.92*  CALCIUM 8.5*  --  8.2*  --  8.1*   < > 8.4* 8.8*  MG 2.2  --  2.3  --   --   --   --   --   PHOS 6.0*  --  6.7*  --  6.8*  --   --   --    < > = values in this interval not displayed.    IMAGING I have personally reviewed the radiological images below and agree with the radiology interpretations.  Ct Head Wo Contrast 02/17/2018 IMPRESSION:  1. No significant interval change in size and appearance of right basal ganglia intraparenchymal hemorrhage, estimated volume 28 mL. Regional mass effect with localized 7 mm right-to-left shift relatively stable. No hydrocephalus or  ventricular trapping.  2. Evolving left frontotemporal scalp contusion.  3. No other acute intracranial abnormality.  4. Underlying atrophy with chronic small vessel ischemic disease and remote right PCA territory infarct.   Ct Head Wo Contrast 02/16/2018   IMPRESSION:  1. Acute right basal ganglia hemorrhage with estimated blood volume of 28 mL. Underlying chronic cerebral ischemia, including previous right PCA infarct.  2. Surrounding edema with regional mass effect including leftward midline shift up to 9 mm. Basilar cisterns are patent.  3. Subtotal effacement of the right lateral ventricle, but no intraventricular extension of blood and no ventriculomegaly.  4. Large left scalp hematoma. No underlying skull fracture identified.   Ct Cervical Spine Wo Contrast 02/16/2018   IMPRESSION:  No acute fracture in the cervical spine.   [place] Renal  02/18/2018 IMPRESSION:  No hydronephrosis.  Left lower pole 9.8 mm nonobstructing stone.   Dg Chest Port 1 View 02/23/2018 IMPRESSION:  No acute abnormality. Stable cardiomegaly, mild pulmonary vascular congestion and minimal chronic interstitial lung disease.   Dg Chest 1 View 02/16/2018 IMPRESSION:  1. Confluent left lower lobe opacity compatible with collapse or consolidation.  2. Right lung ventilation is within normal limits.  3. Chronic cardiomegaly.   Dg Chest Port 1 View 02/16/2018 IMPRESSION:  1. Unchanged retrocardiac opacity which may be combination of pleural fluid and atelectasis or airspace disease.  2. Unchanged cardiomegaly.  3. Mild central bronchial thickening.   Dg Chest Port 1 View 02/16/2018 IMPRESSION:  Central catheter tip in superior vena cava. No pneumothorax. Stable cardiomegaly. No edema or consolidation.   Dg Pelvis 1-2 Views 02/16/2018 IMPRESSION:  No acute fracture or dislocation identified about the pelvis.   Dg Abd Portable 1v 02/17/2018 IMPRESSION:  Tip and side port of the enteric tube below the  diaphragm in the stomach.   Dg Abd Portable 1v 02/17/2018 IMPRESSION:  Tip of the enteric tube remains in the region of the distal esophagus, recommend advancement of an additional 10 cm to place the side-port below the diaphragm.   Dg Abd Portable 1v 02/17/2018 IMPRESSION:  Tip of the enteric tube in the distal esophagus, recommend advancement of least 10 cm to place the side-port below the diaphragm.     Physical exam:  Vitals:   02/22/18 2219 02/23/18 0040 02/23/18 0333 02/23/18 0840  BP: (!) 200/101 (!) 183/88 (!) 187/93 (!) 186/87  Pulse: 69 68 66 69  Resp: 18 18 18 16   Temp:  98.1 F (36.7 C) 98.2 F (36.8 C) 97.9 F (36.6 C)  TempSrc:  Oral Oral Oral  SpO2: 99% 98% 99% 99%  Weight: (!) 399 lb 7.6 oz (181.2 kg)     Height:        Exam: Gen: NAD, conversant, well nourised, morbidly obese, well groomed                     CV: RRR, no MRG. No Carotid Bruits. No peripheral edema, warm, nontender Eyes: Conjunctivae clear without exudates or hemorrhage  Neuro: Detailed Neurologic Exam  Speech:    Speech is dysarthric with normal comprehension.  Cognition:    The patient is oriented to person, month and year Cranial Nerves:    The pupils are equal, round, and reactive to light.  Visual fields are full to finger confrontation. Right gaze preference but can cross the midline. Left lower face weakness. Left lower qudrantanopia.  Hearing intact. Voice is normal. Shoulder shrug is normal.   Coordination:    No dysmetria  Motor Observation:  no involuntary movements noted. Tone:    Decreased muscle tone left.      Strength and sensation    Dense left hemiparesis and hemisensory loss, withdraws minimally on left     Reflex Exam: Toes: Left upgoing   Clonus:    Clonus is absent.    ASSESSMENT/PLAN [name] with history of hypertension, chronic kidney disease, diabetes, and right transmetatarsal amputation presenting with headache,  nausea, and elevated blood pressure. He did not receive IV t-PA due to ICH.  Right BG ICH secondary to uncontrolled hypertension in the setting of CKD stage V  Resultant  Right gaze preference, R field cut, L hemiplegia, dysphagia  CT head 02/16/18  Acute right basal ganglia hemorrhage with estimated  blood volume of 28 mL.  CT head 02/17/18 R BG IPH w/ 7mm R to L shift. Evolving L frontotemporal scalp contusion. Small vessel disease. Atrophy. 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  2D Echo - Decreased EF 45-50%. Diffuse hypokinesis.  Renal US - No hydronephrosis. Left lower pole 9.8 mm nonobstructing stone.  LDL - 108   HgbA1c - 6.4  VTE prophylaxis - SCDs d/c'd, added lovenox (safe as hmg now stable) Fall precautions DIET - DYS 1 Room service appropriate? Yes; Fluid consistency: Nectar Thick Diet NPO time specified Except for: Sips with Meds. ST following  No antithrombotic prior to admission, now on No antithrombotic d/t hmg  Ongoing aggressive stroke risk factor management  Therapy recommendations:   Initially CIR but working wife unable to provide 24/7. SNF now recommended. SW consulted. FL2 signed  Disposition: pending  Induced hypernatremia for Cerebral Edema  Treated with hypertonic saline, now off  Sodium 150->152  Pending HD  Continue to monitor  Hypertension  BP on arrival 251/158. Initially treated with cardene, then changed to cleviprex. Drip now off.  SBP goal < 180  PTA meds: norvasc 10, lasix 40, labetalol 200 bid  Now on:  norvasc 10, clonidine 0.3 tid, labetolol 300 tid  Pending HD   Monitor BP  Long-term BP goal normotensive  CKD stage V  Chronic kidney disease -gentle hydration.   Elevated creatinine on bicarb supplement.   Nephrology consulted and plan is for dialysis per Dr. Hyman Hopes, appreciate help  VVS to put in permacath monday, appreciate help  Plan for HD on Monday   Hyperlipidemia  Lipid  lowering medication PTA:  none  LDL 108  Consider statin at discharge   Type II Diabetes  HgbA1c 6.4, at goal < 7.0  CBGs 120-147  Controlled  SSI  Other Stroke Risk Factors  Morbid Obesity, Body mass index is 51.29 kg/m., recommend weight loss, diet and exercise as appropriate   Hx Stroke:  Previous right PCA infarct by imaging.  Hx diastolic dysfuction. No CHF. EF 45 - 50%  Other Active Problems  Anemia 9.0->9.3 - likely due to CKD  Hospital day # 7  Marvel Plan, MD PhD Stroke Neurology 02/23/2018 12:10 PM  To contact Stroke Continuity provider, please refer to [email or web address]. After hours, contact General Neurology

## 2018-02-23 NOTE — Consult Note (Signed)
Catheter and access by Dr Early tomorrow NPO p midnight Consent  Fabienne Brunsharles Matai Carpenito, MD [facility] Office: [phone] Pager: [phone]

## 2018-02-23 NOTE — H&P (View-Only) (Signed)
Catheter and access by Dr Early tomorrow NPO p midnight Consent  Shyna Duignan, MD [facility] [place] Office: [phone] Pager: [phone]  

## 2018-02-23 NOTE — Progress Notes (Signed)
[facility] ROUNDING NOTE   Subjective:   52 year old with uncontrolled hypertension and diabetes and progressive renal failure    He presented with acute basal ganglion hemorrhage and was found to have progressed to stage 5 chronic renal disease and will need dialysis  Appreciate assistance from Dr Darrick PennaFields plan access in AM and catheter   He has a Right field cut and left hemiplegia    As a result of the bleed  He has a decreased EF  45 %  Diffuse hypokinesia    Objective:  Vital signs in last 24 hours:  Temp:  [97.9 F (36.6 C)-98.2 F (36.8 C)] 97.9 F (36.6 C) (03/24 0840) Pulse Rate:  [66-77] 69 (03/24 0840) Resp:  [16-18] 16 (03/24 0840) BP: (183-200)/(87-101) 186/87 (03/24 0840) SpO2:  [98 %-100 %] 99 % (03/24 0840) Weight:  [399 lb 7.6 oz (181.2 kg)] 399 lb 7.6 oz (181.2 kg) (03/23 2219)  Weight change: 4 lb 3 oz (1.9 kg) Filed Weights   02/17/18 0400 02/21/18 0815 02/22/18 2219  Weight: (!) 387 lb 9.1 oz (175.8 kg) (!) 395 lb 4.6 oz (179.3 kg) (!) 399 lb 7.6 oz (181.2 kg)    Intake/Output: I/O last 3 completed shifts: In: 480 [P.O.:480] Out: 2000 [Urine:2000]   Intake/Output this shift:  No intake/output data recorded.  CVS- RRR RS- CTA ABD- BS present soft non-distended EXT-  Anasarca  Lower extremities      Basic Metabolic Panel: Recent Labs  Lab 02/17/18 0459  02/18/18 0502  02/19/18 0446  02/20/18 0416 02/20/18 1145 02/21/18 0401 02/22/18 0658 02/23/18 0515  NA 138   < > 141   < > 149*   < > 150* 148* 150* 150* 152*  K 5.0  --  5.1  --  5.1  --  5.9*  --  5.1 4.9 5.0  CL 112*  --  113*  --  123*  --  126*  --  122* 119* 122*  CO2 18*  --  17*  --  18*  --  17*  --  18* 19* 20*  GLUCOSE 186*  --  128*  --  123*  --  153*  --  135* 123* 125*  BUN 43*  --  50*  --  51*  --  51*  --  48* 45* 45*  CREATININE 4.09*  --  5.01*  --  5.26*  --  5.20*  --  5.15* 4.96* 4.92*  CALCIUM 8.5*  --  8.2*  --  8.1*  --  8.3*  --  8.6* 8.4*  8.8*  MG 2.2  --  2.3  --   --   --   --   --   --   --   --   PHOS 6.0*  --  6.7*  --  6.8*  --   --   --   --   --   --    < > = values in this interval not displayed.    Liver Function Tests: Recent Labs  Lab 02/16/18 1714  AST 17  ALT 20  ALKPHOS 93  BILITOT 1.0  PROT 7.6  ALBUMIN 3.0*   No results for input(s): LIPASE, AMYLASE in the last 168 hours. No results for input(s): AMMONIA in the last 168 hours.  CBC: Recent Labs  Lab 02/16/18 1714  02/18/18 0502 02/19/18 0446 02/20/18 0416 02/22/18 0658 02/23/18 0515  WBC 8.6   < > 11.9* 7.3 6.6 6.8  6.1  NEUTROABS 6.9  --  9.0* 4.9 4.1  --   --   HGB 10.5*   < > 8.9* 8.7* 9.0* 9.1* 9.3*  HCT 33.2*   < > 28.2* 28.2* 29.6* 29.6* 30.5*  MCV 84.1   < > 82.2 85.5 85.3 84.3 84.3  PLT 331   < > 268 237 229 208 195   < > = values in this interval not displayed.    Cardiac Enzymes: Recent Labs  Lab 02/16/18 1714  CKTOTAL 287    BNP: Invalid input(s): POCBNP  CBG: Recent Labs  Lab 02/22/18 1627 02/22/18 2000 02/23/18 0023 02/23/18 0320 02/23/18 0745  GLUCAP 134* 164* 131* 121* 113*    Microbiology: Results for orders placed or performed during the hospital encounter of 02/16/18  MRSA PCR Screening     Status: None   Collection Time: 02/16/18  8:52 PM  Result Value Ref Range Status   MRSA by PCR NEGATIVE NEGATIVE Final    Comment:        The GeneXpert MRSA Assay (FDA approved for NASAL specimens only), is one component of a comprehensive MRSA colonization surveillance program. It is not intended to diagnose MRSA infection nor to guide or monitor treatment for MRSA infections. Performed at [facility], [address], [place], [place] [address]     Coagulation Studies: No results for input(s): LABPROT, INR in the last 72 hours.  Urinalysis: No results for input(s): COLORURINE, LABSPEC, PHURINE, GLUCOSEU, HGBUR, BILIRUBINUR, KETONESUR, PROTEINUR, UROBILINOGEN, NITRITE, LEUKOCYTESUR in the  last 72 hours.  Invalid input(s): APPERANCEUR    Imaging: No results found.   Medications:   . sodium chloride    . [START ON 02/24/2018]  ceFAZolin (ANCEF) IV    . sodium chloride     .  stroke: mapping our early stages of recovery book   Does not apply Once  . amLODipine  10 mg Per Tube Daily  . calcium acetate  1,334 mg Oral TID WC  . chlorhexidine  15 mL Mouth Rinse BID  . Chlorhexidine Gluconate Cloth  6 each Topical Q0600  . cloNIDine  0.3 mg Oral TID  . insulin aspart  0-15 Units Subcutaneous Q4H  . labetalol  300 mg Oral TID  . mouth rinse  15 mL Mouth Rinse q12n4p  . pantoprazole  40 mg Oral QHS  . senna-docusate  1 tablet Oral BID  . sodium bicarbonate  650 mg Oral TID  . sodium chloride flush  10-40 mL Intracatheter Q12H   sodium chloride, acetaminophen **OR** acetaminophen (TYLENOL) oral liquid 160 mg/5 mL **OR** acetaminophen, hydrALAZINE, labetalol, RESOURCE THICKENUP CLEAR, sodium chloride flush  Assessment/ Plan:   New ESRD  Appreciate assistance of Dr Darrick Penna   Will also need CLIP   HTN will follow will remove fluid on dialysis  Metabolic acidosis should correct with dialysis  Bones  Phos increase   phoslo 2 with meals     PTH 60   Anemia  iron sats  Low will give IV iron  Hemorrhagic CVA with hemiplegia        LOS: 7 [name] [name] W @TODAY @9 :46 AM

## 2018-02-24 ENCOUNTER — Inpatient Hospital Stay (HOSPITAL_COMMUNITY): Payer: 59

## 2018-02-24 ENCOUNTER — Encounter (HOSPITAL_COMMUNITY)
Admission: EM | Disposition: A | Discharge status: Nursing Home (Includes ICF) | Payer: Self-pay | Source: Home / Self Care | Attending: Neurology

## 2018-02-24 ENCOUNTER — Inpatient Hospital Stay (HOSPITAL_COMMUNITY): Payer: 59 | Admitting: Anesthesiology

## 2018-02-24 ENCOUNTER — Encounter (HOSPITAL_COMMUNITY): Payer: Self-pay | Admitting: Critical Care Medicine

## 2018-02-24 DIAGNOSIS — E875 Hyperkalemia: Secondary | ICD-10-CM

## 2018-02-24 DIAGNOSIS — N185 Chronic kidney disease, stage 5: Secondary | ICD-10-CM

## 2018-02-24 DIAGNOSIS — E872 Acidosis: Secondary | ICD-10-CM

## 2018-02-24 HISTORY — PX: INSERTION OF DIALYSIS CATHETER: SHX1324

## 2018-02-24 HISTORY — PX: AV FISTULA PLACEMENT: SHX1204

## 2018-02-24 LAB — CBC
HEMATOCRIT: 30.5 % — AB (ref 39.0–52.0)
HEMATOCRIT: 30.1 % — AB (ref 39.0–52.0)
HEMOGLOBIN: 9.5 g/dL — AB (ref 13.0–17.0)
Hemoglobin: 9.1 g/dL — ABNORMAL LOW (ref 13.0–17.0)
MCH: 26.4 pg (ref 26.0–34.0)
MCH: 25.5 pg — AB (ref 26.0–34.0)
MCHC: 31.1 g/dL (ref 30.0–36.0)
MCHC: 30.2 g/dL (ref 30.0–36.0)
MCV: 84.7 fL (ref 78.0–100.0)
MCV: 84.3 fL (ref 78.0–100.0)
Platelets: 195 10*3/uL (ref 150–400)
Platelets: 204 10*3/uL (ref 150–400)
RBC: 3.6 MIL/uL — ABNORMAL LOW (ref 4.22–5.81)
RBC: 3.57 MIL/uL — AB (ref 4.22–5.81)
RDW: 16.5 % — ABNORMAL HIGH (ref 11.5–15.5)
RDW: 16.2 % — AB (ref 11.5–15.5)
WBC: 8.4 10*3/uL (ref 4.0–10.5)
WBC: 8.3 10*3/uL (ref 4.0–10.5)

## 2018-02-24 LAB — BASIC METABOLIC PANEL
Anion gap: 9 (ref 5–15)
BUN: 46 mg/dL — AB (ref 6–20)
CHLORIDE: 121 mmol/L — AB (ref 101–111)
CO2: 21 mmol/L — AB (ref 22–32)
CREATININE: 4.88 mg/dL — AB (ref 0.61–1.24)
Calcium: 8.9 mg/dL (ref 8.9–10.3)
GFR calc Af Amer: 15 mL/min — ABNORMAL LOW (ref >60)
GFR calc non Af Amer: 13 mL/min — ABNORMAL LOW (ref >60)
GLUCOSE: 136 mg/dL — AB (ref 65–99)
Potassium: 5.2 mmol/L — ABNORMAL HIGH (ref 3.5–5.1)
SODIUM: 151 mmol/L — AB (ref 135–145)

## 2018-02-24 LAB — GLUCOSE, CAPILLARY
GLUCOSE-CAPILLARY: 125 mg/dL — AB (ref 65–99)
GLUCOSE-CAPILLARY: 126 mg/dL — AB (ref 65–99)
GLUCOSE-CAPILLARY: 154 mg/dL — AB (ref 65–99)
Glucose-Capillary: 113 mg/dL — ABNORMAL HIGH (ref 65–99)
Glucose-Capillary: 127 mg/dL — ABNORMAL HIGH (ref 65–99)
Glucose-Capillary: 131 mg/dL — ABNORMAL HIGH (ref 65–99)
Glucose-Capillary: 183 mg/dL — ABNORMAL HIGH (ref 65–99)

## 2018-02-24 LAB — PROTIME-INR
INR: 1.17
Prothrombin Time: 14.8 seconds (ref 11.4–15.2)

## 2018-02-24 LAB — RENAL FUNCTION PANEL
Albumin: 2.2 g/dL — ABNORMAL LOW (ref 3.5–5.0)
Anion gap: 8 (ref 5–15)
BUN: 51 mg/dL — AB (ref 6–20)
CHLORIDE: 123 mmol/L — AB (ref 101–111)
CO2: 20 mmol/L — AB (ref 22–32)
Calcium: 8.7 mg/dL — ABNORMAL LOW (ref 8.9–10.3)
Creatinine, Ser: 5.15 mg/dL — ABNORMAL HIGH (ref 0.61–1.24)
GFR calc Af Amer: 14 mL/min — ABNORMAL LOW (ref >60)
GFR, EST NON AFRICAN AMERICAN: 12 mL/min — AB (ref >60)
Glucose, Bld: 216 mg/dL — ABNORMAL HIGH (ref 65–99)
POTASSIUM: 5.9 mmol/L — AB (ref 3.5–5.1)
Phosphorus: 5.9 mg/dL — ABNORMAL HIGH (ref 2.5–4.6)
Sodium: 151 mmol/L — ABNORMAL HIGH (ref 135–145)

## 2018-02-24 LAB — SURGICAL PCR SCREEN
MRSA, PCR: NEGATIVE
Staphylococcus aureus: NEGATIVE

## 2018-02-24 SURGERY — ARTERIOVENOUS (AV) FISTULA CREATION
Anesthesia: General | Site: Arm Lower | Laterality: Right

## 2018-02-24 MED ORDER — PROPOFOL 10 MG/ML IV BOLUS
INTRAVENOUS | Status: AC
  Filled 2018-02-24: qty 20

## 2018-02-24 MED ORDER — LIDOCAINE HCL (PF) 1 % IJ SOLN: 5.0000 mL | INTRAMUSCULAR | Status: DC | PRN

## 2018-02-24 MED ORDER — PHENYLEPHRINE 40 MCG/ML (10ML) SYRINGE FOR IV PUSH (FOR BLOOD PRESSURE SUPPORT)
PREFILLED_SYRINGE | INTRAVENOUS | Status: DC | PRN
  Administered 2018-02-24: 80 ug via INTRAVENOUS
  Administered 2018-02-24: 120 ug via INTRAVENOUS
  Administered 2018-02-24: 80 ug via INTRAVENOUS

## 2018-02-24 MED ORDER — ONDANSETRON HCL 4 MG/2ML IJ SOLN: 4.0000 mg | Freq: Once | INTRAMUSCULAR | Status: DC | PRN

## 2018-02-24 MED ORDER — FENTANYL CITRATE (PF) 250 MCG/5ML IJ SOLN
INTRAMUSCULAR | Status: DC | PRN
  Administered 2018-02-24 (×2): 25 ug via INTRAVENOUS
  Administered 2018-02-24: 50 ug via INTRAVENOUS
  Administered 2018-02-24: 25 ug via INTRAVENOUS
  Administered 2018-02-24: 100 ug via INTRAVENOUS
  Administered 2018-02-24: 25 ug via INTRAVENOUS

## 2018-02-24 MED ORDER — FENTANYL CITRATE (PF) 250 MCG/5ML IJ SOLN
INTRAMUSCULAR | Status: AC
  Filled 2018-02-24: qty 5

## 2018-02-24 MED ORDER — ROCURONIUM BROMIDE 10 MG/ML (PF) SYRINGE
PREFILLED_SYRINGE | INTRAVENOUS | Status: AC
  Filled 2018-02-24: qty 10

## 2018-02-24 MED ORDER — HEPARIN SODIUM (PORCINE) 1000 UNIT/ML IJ SOLN
INTRAMUSCULAR | Status: DC | PRN
  Administered 2018-02-24: 1000 [IU]

## 2018-02-24 MED ORDER — LIDOCAINE HCL (CARDIAC) 20 MG/ML IV SOLN
INTRAVENOUS | Status: AC
  Filled 2018-02-24: qty 10

## 2018-02-24 MED ORDER — EPHEDRINE 5 MG/ML INJ
INTRAVENOUS | Status: AC
  Filled 2018-02-24: qty 20

## 2018-02-24 MED ORDER — PROPOFOL 10 MG/ML IV BOLUS
INTRAVENOUS | Status: DC | PRN
  Administered 2018-02-24: 200 mg via INTRAVENOUS

## 2018-02-24 MED ORDER — SUCCINYLCHOLINE CHLORIDE 200 MG/10ML IV SOSY
PREFILLED_SYRINGE | INTRAVENOUS | Status: AC
  Filled 2018-02-24: qty 10

## 2018-02-24 MED ORDER — SODIUM CHLORIDE 0.9 % IV SOLN: 100.0000 mL | INTRAVENOUS | Status: DC | PRN

## 2018-02-24 MED ORDER — LIDOCAINE-PRILOCAINE 2.5-2.5 % EX CREA
1.0000 "application " | TOPICAL_CREAM | CUTANEOUS | Status: DC | PRN
  Filled 2018-02-24: qty 5

## 2018-02-24 MED ORDER — MIDAZOLAM HCL 5 MG/5ML IJ SOLN
INTRAMUSCULAR | Status: DC | PRN
  Administered 2018-02-24: 1 mg via INTRAVENOUS

## 2018-02-24 MED ORDER — ESMOLOL HCL 100 MG/10ML IV SOLN
INTRAVENOUS | Status: AC
  Filled 2018-02-24: qty 10

## 2018-02-24 MED ORDER — DEXTROSE 5 % IV SOLN
INTRAVENOUS | Status: DC | PRN
  Administered 2018-02-24: 3 g via INTRAVENOUS

## 2018-02-24 MED ORDER — ONDANSETRON HCL 4 MG/2ML IJ SOLN
INTRAMUSCULAR | Status: AC
  Filled 2018-02-24: qty 2

## 2018-02-24 MED ORDER — MIDAZOLAM HCL 2 MG/2ML IJ SOLN
INTRAMUSCULAR | Status: AC
  Filled 2018-02-24: qty 2

## 2018-02-24 MED ORDER — PHENYLEPHRINE 40 MCG/ML (10ML) SYRINGE FOR IV PUSH (FOR BLOOD PRESSURE SUPPORT)
PREFILLED_SYRINGE | INTRAVENOUS | Status: AC
  Filled 2018-02-24: qty 20

## 2018-02-24 MED ORDER — DEXAMETHASONE SODIUM PHOSPHATE 10 MG/ML IJ SOLN
INTRAMUSCULAR | Status: AC
  Filled 2018-02-24: qty 1

## 2018-02-24 MED ORDER — LIDOCAINE 2% (20 MG/ML) 5 ML SYRINGE
INTRAMUSCULAR | Status: DC | PRN
  Administered 2018-02-24: 100 mg via INTRAVENOUS

## 2018-02-24 MED ORDER — PENTAFLUOROPROP-TETRAFLUOROETH EX AERO: 1.0000 "application " | INHALATION_SPRAY | CUTANEOUS | Status: DC | PRN

## 2018-02-24 MED ORDER — LIDOCAINE-EPINEPHRINE 0.5 %-1:200000 IJ SOLN
INTRAMUSCULAR | Status: AC
  Filled 2018-02-24: qty 1

## 2018-02-24 MED ORDER — HEPARIN SODIUM (PORCINE) 1000 UNIT/ML IJ SOLN
INTRAMUSCULAR | Status: AC
  Filled 2018-02-24: qty 1

## 2018-02-24 MED ORDER — HEPARIN SODIUM (PORCINE) 1000 UNIT/ML DIALYSIS
1000.0000 [IU] | INTRAMUSCULAR | Status: DC | PRN
  Administered 2018-02-25: 1000 [IU] via INTRAVENOUS_CENTRAL
  Filled 2018-02-24 (×2): qty 1

## 2018-02-24 MED ORDER — FENTANYL CITRATE (PF) 100 MCG/2ML IJ SOLN: 25.0000 ug | INTRAMUSCULAR | Status: DC | PRN

## 2018-02-24 MED ORDER — ARTIFICIAL TEARS OPHTHALMIC OINT
TOPICAL_OINTMENT | OPHTHALMIC | Status: AC
  Filled 2018-02-24: qty 3.5

## 2018-02-24 MED ORDER — MUPIROCIN 2 % EX OINT
TOPICAL_OINTMENT | CUTANEOUS | Status: AC
  Filled 2018-02-24: qty 22

## 2018-02-24 MED ORDER — DEXAMETHASONE SODIUM PHOSPHATE 10 MG/ML IJ SOLN
INTRAMUSCULAR | Status: DC | PRN
  Administered 2018-02-24: 4 mg via INTRAVENOUS

## 2018-02-24 MED ORDER — 0.9 % SODIUM CHLORIDE (POUR BTL) OPTIME
TOPICAL | Status: DC | PRN
  Administered 2018-02-24: 1000 mL

## 2018-02-24 MED ORDER — SODIUM CHLORIDE 0.9 % IV SOLN
INTRAVENOUS | Status: DC
  Administered 2018-02-24 – 2018-02-25 (×2): via INTRAVENOUS

## 2018-02-24 MED ORDER — ALTEPLASE 2 MG IJ SOLR
2.0000 mg | Freq: Once | INTRAMUSCULAR | Status: DC | PRN
  Filled 2018-02-24: qty 2

## 2018-02-24 MED ORDER — SODIUM CHLORIDE 0.9 % IJ SOLN
INTRAMUSCULAR | Status: AC
  Filled 2018-02-24: qty 10

## 2018-02-24 MED ORDER — SODIUM CHLORIDE 0.9 % IV SOLN
INTRAVENOUS | Status: DC | PRN
  Administered 2018-02-24: 14:00:00

## 2018-02-24 SURGICAL SUPPLY — 61 items
ADH SKN CLS APL DERMABOND .7 (GAUZE/BANDAGES/DRESSINGS) ×6
ARMBAND PINK RESTRICT EXTREMIT (MISCELLANEOUS) ×10 IMPLANT
BAG DECANTER FOR FLEXI CONT (MISCELLANEOUS) ×5 IMPLANT
BIOPATCH RED 1 DISK 7.0 (GAUZE/BANDAGES/DRESSINGS) ×4 IMPLANT
BIOPATCH RED 1IN DISK 7.0MM (GAUZE/BANDAGES/DRESSINGS) ×1
CANISTER SUCT 3000ML PPV (MISCELLANEOUS) ×5 IMPLANT
CANNULA VESSEL 3MM 2 BLNT TIP (CANNULA) ×5 IMPLANT
CATH PALINDROME RT-P 15FX28CM (CATHETERS) ×3 IMPLANT
CLIP LIGATING EXTRA MED SLVR (CLIP) ×5 IMPLANT
CLIP LIGATING EXTRA SM BLUE (MISCELLANEOUS) ×5 IMPLANT
COVER PROBE W GEL 5X96 (DRAPES) ×3 IMPLANT
COVER SURGICAL LIGHT HANDLE (MISCELLANEOUS) ×5 IMPLANT
DECANTER SPIKE VIAL GLASS SM (MISCELLANEOUS) ×5 IMPLANT
DERMABOND ADVANCED (GAUZE/BANDAGES/DRESSINGS) ×4
DERMABOND ADVANCED .7 DNX12 (GAUZE/BANDAGES/DRESSINGS) ×4 IMPLANT
DRAPE C-ARM 42X72 X-RAY (DRAPES) ×5 IMPLANT
DRAPE CHEST BREAST 15X10 FENES (DRAPES) ×5 IMPLANT
ELECT REM PT RETURN 9FT ADLT (ELECTROSURGICAL) ×5
ELECTRODE REM PT RTRN 9FT ADLT (ELECTROSURGICAL) ×3 IMPLANT
GAUZE SPONGE 4X4 12PLY STRL (GAUZE/BANDAGES/DRESSINGS) ×5 IMPLANT
GLOVE BIO SURGEON STRL SZ 6.5 (GLOVE) ×2 IMPLANT
GLOVE BIO SURGEONS STRL SZ 6.5 (GLOVE) ×1
GLOVE BIOGEL PI IND STRL 6.5 (GLOVE) ×3 IMPLANT
GLOVE BIOGEL PI IND STRL 7.0 (GLOVE) ×2 IMPLANT
GLOVE BIOGEL PI IND STRL 7.5 (GLOVE) ×1 IMPLANT
GLOVE BIOGEL PI IND STRL 8 (GLOVE) ×1 IMPLANT
GLOVE BIOGEL PI INDICATOR 6.5 (GLOVE) ×6
GLOVE BIOGEL PI INDICATOR 7.0 (GLOVE) ×4
GLOVE BIOGEL PI INDICATOR 7.5 (GLOVE) ×2
GLOVE BIOGEL PI INDICATOR 8 (GLOVE) ×2
GLOVE ECLIPSE 7.0 STRL STRAW (GLOVE) ×3 IMPLANT
GLOVE SS BIOGEL STRL SZ 7.5 (GLOVE) ×4 IMPLANT
GLOVE SUPERSENSE BIOGEL SZ 7.5 (GLOVE) ×4
GOWN STRL REUS W/ TWL LRG LVL3 (GOWN DISPOSABLE) ×11 IMPLANT
GOWN STRL REUS W/TWL LRG LVL3 (GOWN DISPOSABLE) ×25
HOVERMATT SINGLE USE (MISCELLANEOUS) ×3 IMPLANT
KIT BASIN OR (CUSTOM PROCEDURE TRAY) ×5 IMPLANT
KIT ROOM TURNOVER OR (KITS) ×5 IMPLANT
NDL 18GX1X1/2 (RX/OR ONLY) (NEEDLE) ×2 IMPLANT
NDL HYPO 25GX1X1/2 BEV (NEEDLE) ×2 IMPLANT
NEEDLE 18GX1X1/2 (RX/OR ONLY) (NEEDLE) ×5 IMPLANT
NEEDLE HYPO 25GX1X1/2 BEV (NEEDLE) ×5 IMPLANT
NS IRRIG 1000ML POUR BTL (IV SOLUTION) ×5 IMPLANT
PACK CV ACCESS (CUSTOM PROCEDURE TRAY) ×5 IMPLANT
PACK SURGICAL SETUP 50X90 (CUSTOM PROCEDURE TRAY) ×5 IMPLANT
PAD ARMBOARD 7.5X6 YLW CONV (MISCELLANEOUS) ×10 IMPLANT
SOAP 2 % CHG 4 OZ (WOUND CARE) ×8 IMPLANT
SUT ETHILON 3 0 PS 1 (SUTURE) ×5 IMPLANT
SUT ETHILON 4 0 PS 2 18 (SUTURE) ×3 IMPLANT
SUT PROLENE 6 0 CC (SUTURE) ×10 IMPLANT
SUT VIC AB 3-0 SH 27 (SUTURE) ×10
SUT VIC AB 3-0 SH 27X BRD (SUTURE) ×6 IMPLANT
SUT VIC AB 4-0 PS2 18 (SUTURE) ×3 IMPLANT
SUT VICRYL 4-0 PS2 18IN ABS (SUTURE) ×5 IMPLANT
SYR 10ML LL (SYRINGE) ×5 IMPLANT
SYR 20CC LL (SYRINGE) ×5 IMPLANT
SYR 5ML LL (SYRINGE) ×10 IMPLANT
TOWEL GREEN STERILE (TOWEL DISPOSABLE) ×10 IMPLANT
TOWEL GREEN STERILE FF (TOWEL DISPOSABLE) ×5 IMPLANT
UNDERPAD 30X30 (UNDERPADS AND DIAPERS) ×5 IMPLANT
WATER STERILE IRR 1000ML POUR (IV SOLUTION) ×5 IMPLANT

## 2018-02-24 NOTE — Progress Notes (Signed)
[facility] ROUNDING NOTE   Subjective:   52 year old with uncontrolled hypertension and diabetes and progressive renal failure    He presented with acute basal ganglion hemorrhage and was found to have progressed to stage 5 chronic renal disease and will need dialysis  Appreciate assistance from Dr Darrick PennaFields for access creation today   He has a Right field cut and left hemiplegia    As a result of the bleed  He has a decreased EF  45 %  Diffuse hypokinesia    Objective:  Vital signs in last 24 hours:  Temp:  [98.4 F (36.9 C)-98.8 F (37.1 C)] 98.6 F (37 C) (03/25 0740) Pulse Rate:  [71-74] 74 (03/25 0740) Resp:  [18] 18 (03/25 0740) BP: (178-201)/(85-93) 185/91 (03/25 0740) SpO2:  [93 %-99 %] 99 % (03/25 0740) Weight:  [393 lb 1.3 oz (178.3 kg)] 393 lb 1.3 oz (178.3 kg) (03/25 1122)  Weight change: -6 lb 6.3 oz (-2.9 kg) Filed Weights   02/22/18 2219 02/24/18 0007 02/24/18 1122  Weight: (!) 399 lb 7.6 oz (181.2 kg) (!) 393 lb 1.3 oz (178.3 kg) (!) 393 lb 1.3 oz (178.3 kg)    Intake/Output: I/O last 3 completed shifts: In: 525 [P.O.:495; I.V.:30] Out: 800 [Urine:800]   Intake/Output this shift:  No intake/output data recorded.  CVS- RRR RS- CTA ABD- BS present soft non-distended EXT-  Anasarca  Lower extremities      Basic Metabolic Panel: Recent Labs  Lab 02/18/18 0502  02/19/18 0446  02/20/18 0416 02/20/18 1145 02/21/18 0401 02/22/18 0658 02/23/18 0515 02/24/18 0426  NA 141   < > 149*   < > 150* 148* 150* 150* 152* 151*  K 5.1  --  5.1  --  5.9*  --  5.1 4.9 5.0 5.2*  CL 113*  --  123*  --  126*  --  122* 119* 122* 121*  CO2 17*  --  18*  --  17*  --  18* 19* 20* 21*  GLUCOSE 128*  --  123*  --  153*  --  135* 123* 125* 136*  BUN 50*  --  51*  --  51*  --  48* 45* 45* 46*  CREATININE 5.01*  --  5.26*  --  5.20*  --  5.15* 4.96* 4.92* 4.88*  CALCIUM 8.2*  --  8.1*  --  8.3*  --  8.6* 8.4* 8.8* 8.9  MG 2.3  --   --   --   --   --   --    --   --   --   PHOS 6.7*  --  6.8*  --   --   --   --   --   --   --    < > = values in this interval not displayed.    Liver Function Tests: No results for input(s): AST, ALT, ALKPHOS, BILITOT, PROT, ALBUMIN in the last 168 hours. No results for input(s): LIPASE, AMYLASE in the last 168 hours. No results for input(s): AMMONIA in the last 168 hours.  CBC: Recent Labs  Lab 02/18/18 0502 02/19/18 0446 02/20/18 0416 02/22/18 0658 02/23/18 0515 02/24/18 0426  WBC 11.9* 7.3 6.6 6.8 6.1 8.4  NEUTROABS 9.0* 4.9 4.1  --   --   --   HGB 8.9* 8.7* 9.0* 9.1* 9.3* 9.5*  HCT 28.2* 28.2* 29.6* 29.6* 30.5* 30.5*  MCV 82.2 85.5 85.3 84.3 84.3 84.7  PLT 268 237 229 208  195 195    Cardiac Enzymes: No results for input(s): CKTOTAL, CKMB, CKMBINDEX, TROPONINI in the last 168 hours.  BNP: Invalid input(s): POCBNP  CBG: Recent Labs  Lab 02/23/18 1612 02/23/18 2030 02/24/18 0031 02/24/18 0420 02/24/18 0736  GLUCAP 137* 120* 131* 125* 113*    Microbiology: Results for orders placed or performed during the hospital encounter of 02/16/18  MRSA PCR Screening     Status: None   Collection Time: 02/16/18  8:52 PM  Result Value Ref Range Status   MRSA by PCR NEGATIVE NEGATIVE Final    Comment:        The GeneXpert MRSA Assay (FDA approved for NASAL specimens only), is one component of a comprehensive MRSA colonization surveillance program. It is not intended to diagnose MRSA infection nor to guide or monitor treatment for MRSA infections. Performed at [facility], [address] [place], [place] [address]     Coagulation Studies: Recent Labs    02/24/18 0426  LABPROT 14.8  INR 1.17    Urinalysis: No results for input(s): COLORURINE, LABSPEC, PHURINE, GLUCOSEU, HGBUR, BILIRUBINUR, KETONESUR, PROTEINUR, UROBILINOGEN, NITRITE, LEUKOCYTESUR in the last 72 hours.  Invalid input(s): APPERANCEUR    Imaging: Dg Chest Port 1 View  Result Date: 02/23/2018 CLINICAL  DATA:  Congestive heart failure. EXAM: PORTABLE CHEST 1 VIEW COMPARISON:  02/16/2018. FINDINGS: Stable enlarged cardiac silhouette. Mildly prominent pulmonary vasculature. Clear lungs. The interstitial markings remain minimally prominent. Unremarkable bones. IMPRESSION: No acute abnormality. Stable cardiomegaly, mild pulmonary vascular congestion and minimal chronic interstitial lung disease. Electronically Signed   By: Beckie Salts M.D.   On: 02/23/2018 10:53     Medications:   . [MAR Hold] sodium chloride    . sodium chloride 10 mL/hr at 02/24/18 1125  .  ceFAZolin (ANCEF) IV    . [MAR Hold] ferric gluconate (FERRLECIT/NULECIT) IV Stopped (02/23/18 1257)  . [MAR Hold] sodium chloride     . [MAR Hold]  stroke: mapping our early stages of recovery book   Does not apply Once  . [MAR Hold] amLODipine  10 mg Per Tube Daily  . [MAR Hold] calcium acetate  1,334 mg Oral TID WC  . [MAR Hold] chlorhexidine  15 mL Mouth Rinse BID  . [MAR Hold] Chlorhexidine Gluconate Cloth  6 each Topical Q0600  . [MAR Hold] cloNIDine  0.3 mg Oral TID  . [MAR Hold] insulin aspart  0-15 Units Subcutaneous Q4H  . [MAR Hold] labetalol  300 mg Oral TID  . [MAR Hold] mouth rinse  15 mL Mouth Rinse q12n4p  . mupirocin ointment      . [MAR Hold] pantoprazole  40 mg Oral QHS  . [MAR Hold] senna-docusate  1 tablet Oral BID  . [MAR Hold] sodium bicarbonate  650 mg Oral TID  . [MAR Hold] sodium chloride flush  10-40 mL Intracatheter Q12H   [MAR Hold] sodium chloride, [MAR Hold] acetaminophen **OR** [MAR Hold] acetaminophen (TYLENOL) oral liquid 160 mg/5 mL **OR** [MAR Hold] acetaminophen, [MAR Hold] hydrALAZINE, [MAR Hold] labetalol, [MAR Hold] RESOURCE THICKENUP CLEAR, [MAR Hold] sodium chloride flush  Assessment/ Plan:   New ESRD  Appreciate assistance of Dr Darrick Penna   Will also need CLIP   HTN will follow will remove fluid on dialysis  Metabolic acidosis should correct with dialysis  Bones  Phos increase   phoslo 2  with meals     PTH 60   Anemia  iron sats  Low will give IV iron  Hemorrhagic CVA with  hemiplegia        LOS: 8 Vicci Reder W @TODAY @11 :44 AM

## 2018-02-24 NOTE — Progress Notes (Addendum)
STROKE TEAM PROGRESS NOTE   SUBJECTIVE (INTERVAL HISTORY) No family at bedside. Patient lying in bed without distress.  Vascular surgery planning for permacath placement for today at noon. Nephrology working on HD next week after permacath placed. Patient still has left hemiplegia.  BP remains high this am, on BP meds, pending HD.   Addressed elevated BP with Luna KitchensLatasha RN this am as well as am med administration.    CBC:  Recent Labs  Lab 02/19/18 0446 02/20/18 0416  02/23/18 0515 02/24/18 0426  WBC 7.3 6.6   < > 6.1 8.4  NEUTROABS 4.9 4.1  --   --   --   HGB 8.7* 9.0*   < > 9.3* 9.5*  HCT 28.2* 29.6*   < > 30.5* 30.5*  MCV 85.5 85.3   < > 84.3 84.7  PLT 237 229   < > 195 195   < > = values in this interval not displayed.    Basic Metabolic Panel:  Recent Labs  Lab 02/18/18 0502  02/19/18 0446  02/23/18 0515 02/24/18 0426  NA 141   < > 149*   < > 152* 151*  K 5.1  --  5.1   < > 5.0 5.2*  CL 113*  --  123*   < > 122* 121*  CO2 17*  --  18*   < > 20* 21*  GLUCOSE 128*  --  123*   < > 125* 136*  BUN 50*  --  51*   < > 45* 46*  CREATININE 5.01*  --  5.26*   < > 4.92* 4.88*  CALCIUM 8.2*  --  8.1*   < > 8.8* 8.9  MG 2.3  --   --   --   --   --   PHOS 6.7*  --  6.8*  --   --   --    < > = values in this interval not displayed.    IMAGING  Ct Head Wo Contrast 02/17/2018 IMPRESSION:  1. No significant interval change in size and appearance of right basal ganglia intraparenchymal hemorrhage, estimated volume 28 mL. Regional mass effect with localized 7 mm right-to-left shift relatively stable. No hydrocephalus or ventricular trapping.  2. Evolving left frontotemporal scalp contusion.  3. No other acute intracranial abnormality.  4. Underlying atrophy with chronic small vessel ischemic disease and remote right PCA territory infarct.   Ct Head Wo Contrast 02/16/2018   IMPRESSION:  1. Acute right basal ganglia hemorrhage with estimated blood volume of 28 mL. Underlying  chronic cerebral ischemia, including previous right PCA infarct.  2. Surrounding edema with regional mass effect including leftward midline shift up to 9 mm. Basilar cisterns are patent.  3. Subtotal effacement of the right lateral ventricle, but no intraventricular extension of blood and no ventriculomegaly.  4. Large left scalp hematoma. No underlying skull fracture identified.   Ct Cervical Spine Wo Contrast 02/16/2018   IMPRESSION:  No acute fracture in the cervical spine.   [place] Renal 02/18/2018 IMPRESSION:  No hydronephrosis.  Left lower pole 9.8 mm nonobstructing stone.   Dg Chest Port 1 View 02/23/2018 IMPRESSION:  No acute abnormality. Stable cardiomegaly, mild pulmonary vascular congestion and minimal chronic interstitial lung disease.   Dg Chest 1 View 02/16/2018 IMPRESSION:  1. Confluent left lower lobe opacity compatible with collapse or consolidation.  2. Right lung ventilation is within normal limits.  3. Chronic cardiomegaly.   Dg Chest Port 1 View 02/16/2018 IMPRESSION:  1. Unchanged  retrocardiac opacity which may be combination of pleural fluid and atelectasis or airspace disease.  2. Unchanged cardiomegaly.  3. Mild central bronchial thickening.   Dg Chest Port 1 View 02/16/2018 IMPRESSION:  Central catheter tip in superior vena cava. No pneumothorax. Stable cardiomegaly. No edema or consolidation.   Dg Pelvis 1-2 Views 02/16/2018 IMPRESSION:  No acute fracture or dislocation identified about the pelvis.   Dg Abd Portable 1v 02/17/2018 IMPRESSION:  Tip and side port of the enteric tube below the diaphragm in the stomach.   Dg Abd Portable 1v 02/17/2018 IMPRESSION:  Tip of the enteric tube remains in the region of the distal esophagus, recommend advancement of an additional 10 cm to place the side-port below the diaphragm.   Dg Abd Portable 1v 02/17/2018 IMPRESSION:  Tip of the enteric tube in the distal esophagus, recommend advancement of least 10 cm  to place the side-port below the diaphragm.    Physical exam:  Vitals:   02/23/18 2208 02/24/18 0007 02/24/18 0411 02/24/18 0740  BP: (!) 201/87 (!) 193/85 (!) 185/91 (!) 185/91  Pulse:  73 74 74  Resp:  18 18 18   Temp:  98.8 F (37.1 C) 98.8 F (37.1 C) 98.6 F (37 C)  TempSrc:  Axillary Axillary Oral  SpO2:  93% 98% 99%  Weight:  (!) 178.3 kg (393 lb 1.3 oz)    Height:        Exam: Gen: NAD, conversant, well nourised, morbidly obese, well groomed. Lips dry              CV: RRR, no MRG. No Carotid Bruits. No peripheral edema, warm, nontender Eyes: Conjunctivae clear without exudates or hemorrhage  Neuro: Detailed Neurologic Exam  Speech:    Speech is dysarthric with normal comprehension.  Cognition:    The patient is oriented to person, month and year Cranial Nerves:    The pupils are equal, round, and reactive to light.  Visual fields are full to finger confrontation. Right gaze preference but can cross the midline. Left lower face weakness. Left lower qudrantanopia.  Hearing intact. Voice is normal. Shoulder shrug is normal.   Coordination:    No dysmetria  Motor Observation:  no involuntary movements noted. Tone:    Decreased muscle tone left.      Strength and sensation    Dense left hemiparesis and hemisensory loss, withdraws minimally on left     Reflex Exam: Toes: Left upgoing     ASSESSMENT/PLAN [name] with history of hypertension, chronic kidney disease, diabetes, and right transmetatarsal amputation presenting with headache, nausea, and elevated blood pressure. He did not receive IV t-PA due to ICH.  Right BG ICH secondary to uncontrolled hypertension in the setting of CKD stage V  Resultant  Right gaze preference, L lower quadrant field cut, L hemiplegia, dysphagia  CT head 02/16/18  Acute right basal ganglia hemorrhage with estimated blood volume of 28 mL.  CT head 02/17/18 R BG IPH w/ 7mm R to L shift. Evolving L  frontotemporal scalp contusion. Small vessel disease. Atrophy. 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  2D Echo - Decreased EF 45-50%. Diffuse hypokinesis.  Renal US - No hydronephrosis. Left lower pole 9.8 mm nonobstructing stone.  LDL - 108   HgbA1c - 6.4  VTE prophylaxis - SCDs d/c'd, added lovenox (safe as hmg now stable) Fall precautions Diet NPO time specified Except for: Sips with Meds.  ST following  No antithrombotic prior to admission, now on No antithrombotic d/t hmg  Ongoing aggressive stroke risk factor management  Therapy recommendations:   Initially CIR but working wife unable to provide 24/7. SNF now recommended. SW consulted. FL2 signed  Disposition: SNF pending  Induced hypernatremia for Cerebral Edema  Treated with hypertonic saline, now off  Sodium 150->151  Pending HD  Continue to monitor  Hypertensive Emergency  BP on arrival 251/158. Initially treated with cardene, then changed to cleviprex. Drip now off.  SBP goal < 180  PTA meds: norvasc 10, lasix 40, labetalol 200 bid  Now on:  norvasc 10, clonidine 0.3 tid, labetolol 300 tid  Pending HD   BP up this am > 185. Received 2 doses labetalol during the night. To receive additional dose this am Monitor BP  Long-term BP goal normotensive  CKD stage V  Chronic kidney disease -gentle hydration.   Elevated creatinine on bicarb supplement.   Nephrology consulted and plan is for dialysis per Dr. Hyman Hopes, appreciate help  VVS to put in permacath monday, appreciate help  Plan for HD on Monday   Hyperlipidemia  Lipid lowering medication PTA:  none  LDL 108  Consider statin at discharge   Type II Diabetes  HgbA1c 6.4, at goal < 7.0  CBGs 120-147  Controlled  SSI  Other Stroke Risk Factors  Morbid Obesity, Body mass index is 50.47 kg/m., recommend weight loss, diet and exercise as appropriate   Hx Stroke:  Previous right PCA infarct by  imaging.  Hx diastolic dysfuction. No CHF. EF 45 - 50%  Other Active Problems  Anemia 9.0->9.3 - likely due to CKD  Hospital day # 8  Rhoderick Moody [facility] See Amion for Pager information 02/24/2018 11:25 AM   ATTENDING NOTE: I reviewed above note and agree with the assessment and plan. I have made any additions or clarifications directly to the above note. Pt was seen and examined.   Pt back from permacath and AVF. Sleepy on exam likely due to anesthesia. Open eyes on voice and following commands. No neuro changes from yesterday. BP better, now at 150-160s. Hopefully will start HD soon. Today Cre stable but K+ 5.2 in am. Hb stable. Will repeat K this pm. Continue monitoring  Marvel Plan, MD PhD Stroke Neurology 02/24/2018 4:19 PM     To contact Stroke Continuity provider, please refer to [email or web address]. After hours, contact General Neurology

## 2018-02-24 NOTE — Op Note (Signed)
    OPERATIVE REPORT  DATE OF SURGERY: 02/24/2018  PATIENT: [name] [name], 52 y.o. male MRN: [id number]  DOB: [birth year]  PRE-OPERATIVE DIAGNOSIS: End-stage renal disease  POST-OPERATIVE DIAGNOSIS:  Same  PROCEDURE: #1 right IJ tunneled hemodialysis catheter with SonoSite visualization, #2 left radiocephalic AV fistula creation  SURGEON:  Gretta Beganodd Caliope Ruppert, M.D.  PHYSICIAN ASSISTANT: Lianne CureMaureen Collins PA-C  ANESTHESIA: LMA  EBL: Minimal ml  Total I/O In: 400 [I.V.:400] Out: -   BLOOD ADMINISTERED: None  DRAINS: None  SPECIMEN: None  COUNTS CORRECT:  YES  PLAN OF CARE: PACU stable  PATIENT DISPOSITION:  PACU - hemodynamically stable  PROCEDURE DETAILS: Patient was taken to the operative place she goes through the area of the right and left neck were imaged with ultrasound showing widely patent jugular veins bilaterally.  The right and left neck and chest were prepped and draped in usual sterile fashion.  Using an 18-gauge needle and sinus site visualization the right internal jugular vein was entered at the base of the neck.  A guidewire was passed down to the level of the right atrium this was confirmed confirmed with fluoroscopy.  A dilator and peel-away sheath was passed over the guidewire and a 23 cm hemodialysis catheter was passed and positioned at the level of the distal right atrium.  A separate stab incision was made and a tunnel was created from this to the entry site and the catheter was brought through the tunnel.  2 lm ports were attached and both lumens flushed and aspirated easily and were locked with 1000 unit/cc heparin.  The catheter was secured to the skin with a 3-0 nylon stitch and the entry site was closed with 4 septic or Vicryl stitch.  Was locked with 1000 unit/cc heparin.  Sterile dressing was applied.  Attention was then turned to the left arm.  SonoSite was used to visualize the veins in the left arm the patient did have a good caliber cephalic vein  beginning at the wrist all the way to the antecubital space.  The cephalic vein drain mainly into the basilic vein above the elbow.  Incision was made between the cephalic vein and the radial artery at the wrist.  The vein was of large caliber and tributary branches were ligated with 3-0 and 4-0 silk ties and divided.  The radial artery was exposed to the same incision and was also of large caliber with no evidence of atherosclerotic change.  The vein was ligated distally and was brought into approximation with the radial artery.  The radial artery was occluded proximally and distally and was opened with an 11 blade and sent along [place] with Potts scissors.  The vein was cut to the appropriate length and was sewn end-to-side to the radial artery with a running 6-0 Prolene suture.  Clamps removed and excellent thrill was noted.  Wounds irrigated with saline.  Hemostasis elect cautery.  Wounds were closed with 3-0 Vicryl in the subcutaneous and subpectoral tissue.  Patient was transferred to the recovery room in stable condition   [name] [name], M.D., [facility]ACS 02/24/2018 2:30 PM

## 2018-02-24 NOTE — Progress Notes (Addendum)
Contacts in place removed and called 3w to come pick them up. Pt states he ate applesauce this pm at 1300. Informed him that it was not yet 1300.. called the floor and spoke with Tasha who states he did not eat applesauce only think liquids.

## 2018-02-24 NOTE — Anesthesia Postprocedure Evaluation (Signed)
Anesthesia Post Note  Patient: Loleta ChanceReginald F Peplinski  Procedure(s) Performed: ARTERIOVENOUS (AV) FISTULA CREATION (Left Arm Lower) INSERTION OF DIALYSIS CATHETER - RIGHT INTERNAL JUGULAR PLACEMENT (Right )     Patient location during evaluation: PACU Anesthesia Type: General Level of consciousness: awake and alert Pain management: pain level controlled Vital Signs Assessment: post-procedure vital signs reviewed and stable Respiratory status: spontaneous breathing, nonlabored ventilation, respiratory function stable and patient connected to nasal cannula oxygen Cardiovascular status: blood pressure returned to baseline and stable Postop Assessment: no apparent nausea or vomiting Anesthetic complications: no    Last Vitals:  Vitals:   02/24/18 1510 02/24/18 1525  BP: (!) 167/88 (!) 170/87  Pulse: 65 67  Resp: 18 20  Temp:    SpO2: 97% 93%    Last Pain:  Vitals:   02/24/18 1525  TempSrc:   PainSc: 0-No pain                 Shekera Beavers DAVID

## 2018-02-24 NOTE — Interval H&P Note (Signed)
History and Physical Interval Note:  02/24/2018 11:46 AM  [name] [name]  has presented today for surgery, with the diagnosis of end stage renal disease  The various methods of treatment have been discussed with the patient and family. After consideration of risks, benefits and other options for treatment, the patient has consented to  Procedure(s): INSERTION OF DIALYSIS CATHETER (N/A) INSERTION OF ARTERIOVENOUS (AV) GORE-TEX GRAFT ARM VERSUS FISTULA PLACEMENT (Left) as a surgical intervention .  The patient's history has been reviewed, patient examined, no change in status, stable for surgery.  I have reviewed the patient's chart and labs.  Questions were answered to the patient's satisfaction.     Gretta Beganodd Lenise Jr

## 2018-02-24 NOTE — Transfer of Care (Signed)
Immediate Anesthesia Transfer of Care Note  Patient: [name] [name]  Procedure(s) Performed: ARTERIOVENOUS (AV) FISTULA CREATION (Left Arm Lower) INSERTION OF DIALYSIS CATHETER - RIGHT INTERNAL JUGULAR PLACEMENT (Right )  Patient Location: PACU  Anesthesia Type:General  Level of Consciousness: awake and alert   Airway & Oxygen Therapy: Patient Spontanous Breathing and Patient connected to nasal cannula oxygen  Post-op Assessment: Report given to RN and Post -op Vital signs reviewed and stable  Post vital signs: Reviewed and stable  Last Vitals:  Vitals Value Taken Time  BP 153/80 02/24/2018  2:25 PM  Temp    Pulse 64 02/24/2018  2:26 PM  Resp 18 02/24/2018  2:26 PM  SpO2 94 % 02/24/2018  2:26 PM  Vitals shown include unvalidated device data.  Last Pain:  Vitals:   02/24/18 0740  TempSrc: Oral  PainSc:       Patients Stated Pain Goal: 0 (02/20/18 2100)  Complications: No apparent anesthesia complications

## 2018-02-24 NOTE — Anesthesia Preprocedure Evaluation (Addendum)
Anesthesia Evaluation  Patient identified by MRN, date of birth, ID band Patient awake    Reviewed: Allergy & Precautions, NPO status , Patient's Chart, lab work & pertinent test results  Airway Mallampati: III  TM Distance: >3 FB Neck ROM: Full    Dental no notable dental hx.    Pulmonary neg pulmonary ROS,    Pulmonary exam normal breath sounds clear to auscultation       Cardiovascular hypertension, Pt. on medications and Pt. on home beta blockers Normal cardiovascular exam Rhythm:Regular Rate:Normal  ECHO: LV EF: 45% - 50%   Neuro/Psych negative neurological ROS  negative psych ROS   GI/Hepatic negative GI ROS, Neg liver ROS,   Endo/Other  diabetes, Insulin DependentMorbid obesity  Renal/GU Renal disease     Musculoskeletal negative musculoskeletal ROS (+)   Abdominal (+) + obese,   Peds  Hematology  (+) anemia ,   Anesthesia Other Findings end stage renal disease Super obese  Reproductive/Obstetrics                            Anesthesia Physical Anesthesia Plan  ASA: IV  Anesthesia Plan: General   Post-op Pain Management:    Induction: Intravenous  PONV Risk Score and Plan: 2 and Ondansetron, Treatment may vary due to age or medical condition and Dexamethasone  Airway Management Planned: Oral ETT  Additional Equipment:   Intra-op Plan:   Post-operative Plan: Extubation in OR  Informed Consent: I have reviewed the patients History and Physical, chart, labs and discussed the procedure including the risks, benefits and alternatives for the proposed anesthesia with the patient or authorized representative who has indicated his/her understanding and acceptance.   Dental advisory given  Plan Discussed with: CRNA  Anesthesia Plan Comments:        Anesthesia Quick Evaluation

## 2018-02-24 NOTE — Progress Notes (Signed)
Patient return to the unit form PACU alert and Oriented  To self and situation. He is still drowsy. Denies pain. Vitals are stable. Will continue to monitor.

## 2018-02-24 NOTE — Anesthesia Procedure Notes (Signed)
Procedure Name: LMA Insertion Date/Time: 02/24/2018 12:26 PM Performed by: Rachel MouldsLee, Sajad Glander B, CRNA Pre-anesthesia Checklist: Patient identified, Emergency Drugs available, Suction available, Patient being monitored and Timeout performed Patient Re-evaluated:Patient Re-evaluated prior to induction Oxygen Delivery Method: Circle system utilized Preoxygenation: Pre-oxygenation with 100% oxygen Induction Type: IV induction LMA: LMA inserted LMA Size: 5.0 Number of attempts: 1 Placement Confirmation: positive ETCO2 and breath sounds checked- equal and bilateral Tube secured with: Tape Dental Injury: Teeth and Oropharynx as per pre-operative assessment

## 2018-02-24 NOTE — Progress Notes (Signed)
PT Cancellation Note  Patient Details Name: [name] [name] MRN: [id number] DOB: 05-May-1966   Cancelled Treatment:    Reason Eval/Treat Not Completed: Patient not medically ready;Patient at procedure or test/unavailable On first attempt, pt with elevated BP 188/88, (SBP<180). On second attempt, pt down in OR for placement of permacath. Will follow up as time allows.   Blake DivineShauna A Baldwin [place] 02/24/2018, 12:01 PM Mylo RedShauna Stepheny Canal, PT, DPT [phone]

## 2018-02-25 ENCOUNTER — Encounter (HOSPITAL_COMMUNITY): Payer: Self-pay | Admitting: Vascular Surgery

## 2018-02-25 DIAGNOSIS — E875 Hyperkalemia: Secondary | ICD-10-CM | POA: Diagnosis not present

## 2018-02-25 DIAGNOSIS — Z992 Dependence on renal dialysis: Secondary | ICD-10-CM

## 2018-02-25 DIAGNOSIS — N186 End stage renal disease: Secondary | ICD-10-CM

## 2018-02-25 LAB — BASIC METABOLIC PANEL
ANION GAP: 10 (ref 5–15)
Anion gap: 7 (ref 5–15)
BUN: 53 mg/dL — ABNORMAL HIGH (ref 6–20)
BUN: 47 mg/dL — AB (ref 6–20)
CALCIUM: 8.5 mg/dL — AB (ref 8.9–10.3)
CHLORIDE: 121 mmol/L — AB (ref 101–111)
CO2: 21 mmol/L — ABNORMAL LOW (ref 22–32)
CO2: 23 mmol/L (ref 22–32)
CREATININE: 4.55 mg/dL — AB (ref 0.61–1.24)
Calcium: 8.6 mg/dL — ABNORMAL LOW (ref 8.9–10.3)
Chloride: 121 mmol/L — ABNORMAL HIGH (ref 101–111)
Creatinine, Ser: 4.96 mg/dL — ABNORMAL HIGH (ref 0.61–1.24)
GFR calc Af Amer: 16 mL/min — ABNORMAL LOW (ref >60)
GFR calc non Af Amer: 14 mL/min — ABNORMAL LOW (ref >60)
GFR, EST AFRICAN AMERICAN: 14 mL/min — AB (ref >60)
GFR, EST NON AFRICAN AMERICAN: 12 mL/min — AB (ref >60)
GLUCOSE: 181 mg/dL — AB (ref 65–99)
Glucose, Bld: 157 mg/dL — ABNORMAL HIGH (ref 65–99)
POTASSIUM: 5.7 mmol/L — AB (ref 3.5–5.1)
POTASSIUM: 5.3 mmol/L — AB (ref 3.5–5.1)
SODIUM: 152 mmol/L — AB (ref 135–145)
SODIUM: 151 mmol/L — AB (ref 135–145)

## 2018-02-25 LAB — CBC
HCT: 29.1 % — ABNORMAL LOW (ref 39.0–52.0)
Hemoglobin: 8.6 g/dL — ABNORMAL LOW (ref 13.0–17.0)
MCH: 24.9 pg — ABNORMAL LOW (ref 26.0–34.0)
MCHC: 29.6 g/dL — ABNORMAL LOW (ref 30.0–36.0)
MCV: 84.3 fL (ref 78.0–100.0)
PLATELETS: 192 10*3/uL (ref 150–400)
RBC: 3.45 MIL/uL — AB (ref 4.22–5.81)
RDW: 16.3 % — AB (ref 11.5–15.5)
WBC: 6.5 10*3/uL (ref 4.0–10.5)

## 2018-02-25 LAB — GLUCOSE, CAPILLARY
GLUCOSE-CAPILLARY: 137 mg/dL — AB (ref 65–99)
GLUCOSE-CAPILLARY: 142 mg/dL — AB (ref 65–99)
GLUCOSE-CAPILLARY: 143 mg/dL — AB (ref 65–99)
GLUCOSE-CAPILLARY: 148 mg/dL — AB (ref 65–99)
GLUCOSE-CAPILLARY: 197 mg/dL — AB (ref 65–99)
Glucose-Capillary: 160 mg/dL — ABNORMAL HIGH (ref 65–99)

## 2018-02-25 LAB — ALT: ALT: 10 U/L — ABNORMAL LOW (ref 17–63)

## 2018-02-25 MED ORDER — SODIUM POLYSTYRENE SULFONATE 15 GM/60ML PO SUSP
30.0000 g | Freq: Once | ORAL | Status: AC
  Administered 2018-02-25: 30 g via ORAL
  Filled 2018-02-25 (×2): qty 120

## 2018-02-25 MED ORDER — PHENOL 1.4 % MT LIQD: 1.0000 | OROMUCOSAL | Status: DC | PRN

## 2018-02-25 MED ORDER — BISACODYL 10 MG RE SUPP
10.0000 mg | Freq: Once | RECTAL | Status: AC
  Administered 2018-02-25: 10 mg via RECTAL
  Filled 2018-02-25: qty 1

## 2018-02-25 MED ORDER — PNEUMOCOCCAL VAC POLYVALENT 25 MCG/0.5ML IJ INJ
0.5000 mL | INJECTION | INTRAMUSCULAR | Status: AC
  Administered 2018-02-26: 0.5 mL via INTRAMUSCULAR
  Filled 2018-02-25: qty 0.5

## 2018-02-25 MED ORDER — HYDRALAZINE HCL 20 MG/ML IJ SOLN
10.0000 mg | INTRAMUSCULAR | Status: DC | PRN
  Administered 2018-02-26: 10 mg via INTRAVENOUS
  Administered 2018-02-26 – 2018-02-27 (×3): 20 mg via INTRAVENOUS
  Administered 2018-02-27: 10 mg via INTRAVENOUS
  Administered 2018-03-02: 20 mg via INTRAVENOUS
  Filled 2018-02-25 (×5): qty 1

## 2018-02-25 MED ORDER — HYDRALAZINE HCL 50 MG PO TABS
50.0000 mg | ORAL_TABLET | Freq: Three times a day (TID) | ORAL | Status: DC
  Administered 2018-02-25 – 2018-02-26 (×4): 50 mg via ORAL
  Filled 2018-02-25 (×4): qty 1

## 2018-02-25 NOTE — Progress Notes (Signed)
[facility] ROUNDING NOTE   Subjective:   52 year old with uncontrolled hypertension and diabetes and progressive renal failure    He presented with acute basal ganglion hemorrhage and was found to have progressed to stage 5 chronic renal disease and will need dialysis  Appreciate assistance from Dr Early   AVF Left and Dialysis catheter  He has a Right field cut and left hemiplegia    As a result of the bleed  He has a decreased EF  45 %  Diffuse hypokinesia    Objective:  Vital signs in last 24 hours:  Temp:  [97.8 F (36.6 C)-98.8 F (37.1 C)] 98.8 F (37.1 C) (03/26 1213) Pulse Rate:  [65-89] 68 (03/26 1213) Resp:  [16-21] 17 (03/26 1213) BP: (130-195)/(68-99) 130/68 (03/26 1342) SpO2:  [92 %-99 %] 95 % (03/26 1213) Weight:  [375 lb 7.1 oz (170.3 kg)-382 lb 0.9 oz (173.3 kg)] 375 lb 7.1 oz (170.3 kg) (03/26 0507)  Weight change: 0 lb (0 kg) Filed Weights   02/24/18 1122 02/25/18 0250 02/25/18 0507  Weight: (!) 393 lb 1.3 oz (178.3 kg) (!) 382 lb 0.9 oz (173.3 kg) (!) 375 lb 7.1 oz (170.3 kg)    Intake/Output: I/O last 3 completed shifts: In: 735 [P.O.:255; I.V.:480] Out: 3000 [Other:3000]   Intake/Output this shift:  Total I/O In: 120 [P.O.:120] Out: -   CVS- RRR RS- CTA ABD- BS present soft non-distended EXT-  Anasarca  Lower extremities      Basic Metabolic Panel: Recent Labs  Lab 02/19/18 0446  02/23/18 0515 02/24/18 0426 02/24/18 2050 02/25/18 0321 02/25/18 0958  NA 149*   < > 152* 151* 151* 152* 151*  K 5.1   < > 5.0 5.2* 5.9* 5.7* 5.3*  CL 123*   < > 122* 121* 123* 121* 121*  CO2 18*   < > 20* 21* 20* 21* 23  GLUCOSE 123*   < > 125* 136* 216* 181* 157*  BUN 51*   < > 45* 46* 51* 53* 47*  CREATININE 5.26*   < > 4.92* 4.88* 5.15* 4.96* 4.55*  CALCIUM 8.1*   < > 8.8* 8.9 8.7* 8.5* 8.6*  PHOS 6.8*  --   --   --  5.9*  --   --    < > = values in this interval not displayed.    Liver Function Tests: Recent Labs  Lab  02/24/18 2050 02/25/18 0321  ALT  --  10*  ALBUMIN 2.2*  --    No results for input(s): LIPASE, AMYLASE in the last 168 hours. No results for input(s): AMMONIA in the last 168 hours.  CBC: Recent Labs  Lab 02/19/18 0446 02/20/18 0416 02/22/18 0658 02/23/18 0515 02/24/18 0426 02/24/18 2050 02/25/18 0321  WBC 7.3 6.6 6.8 6.1 8.4 8.3 6.5  NEUTROABS 4.9 4.1  --   --   --   --   --   HGB 8.7* 9.0* 9.1* 9.3* 9.5* 9.1* 8.6*  HCT 28.2* 29.6* 29.6* 30.5* 30.5* 30.1* 29.1*  MCV 85.5 85.3 84.3 84.3 84.7 84.3 84.3  PLT 237 229 208 195 195 204 192    Cardiac Enzymes: No results for input(s): CKTOTAL, CKMB, CKMBINDEX, TROPONINI in the last 168 hours.  BNP: Invalid input(s): POCBNP  CBG: Recent Labs  Lab 02/24/18 1647 02/24/18 1926 02/25/18 0039 02/25/18 0804 02/25/18 1200  GLUCAP 154* 183* 197* 142* 148*    Microbiology: Results for orders placed or performed during the hospital encounter of 02/16/18  MRSA PCR Screening     Status: None   Collection Time: 02/16/18  8:52 PM  Result Value Ref Range Status   MRSA by PCR NEGATIVE NEGATIVE Final    Comment:        The GeneXpert MRSA Assay (FDA approved for NASAL specimens only), is one component of a comprehensive MRSA colonization surveillance program. It is not intended to diagnose MRSA infection nor to guide or monitor treatment for MRSA infections. Performed at [facility], [address], [place], [place] [address]   Surgical pcr screen     Status: None   Collection Time: 02/24/18  9:13 AM  Result Value Ref Range Status   MRSA, PCR NEGATIVE NEGATIVE Final   Staphylococcus aureus NEGATIVE NEGATIVE Final    Comment: (NOTE) The Xpert SA Assay (FDA approved for NASAL specimens in patients 82 years of age and older), is one component of a comprehensive surveillance program. It is not intended to diagnose infection nor to guide or monitor treatment. Performed at [facility], [address]  [place], [place] [address]     Coagulation Studies: Recent Labs    02/24/18 0426  LABPROT 14.8  INR 1.17    Urinalysis: No results for input(s): COLORURINE, LABSPEC, PHURINE, GLUCOSEU, HGBUR, BILIRUBINUR, KETONESUR, PROTEINUR, UROBILINOGEN, NITRITE, LEUKOCYTESUR in the last 72 hours.  Invalid input(s): APPERANCEUR    Imaging: Dg Chest Port 1v Same Day  Result Date: 02/24/2018 CLINICAL DATA:  Hypertension.  Central catheter placement EXAM: PORTABLE CHEST 1 VIEW COMPARISON:  February 23, 2018 FINDINGS: Central catheter tip is at the cavoatrial junction. No pneumothorax. There is no appreciable edema or consolidation. There is cardiomegaly with pulmonary vascularity within normal limits. No adenopathy. No bone lesions. IMPRESSION: Cardiomegaly without edema or consolidation. Central catheter tip at cavoatrial junction. No pneumothorax. Electronically Signed   By: Bretta Bang III M.D.   On: 02/24/2018 15:45   Dg Fluoro Guide Cv Line-no Report  Result Date: 02/24/2018 Fluoroscopy was utilized by the requesting physician.  No radiographic interpretation.     Medications:   . sodium chloride    . sodium chloride 10 mL/hr at 02/25/18 0940  . sodium chloride    . sodium chloride    . ferric gluconate (FERRLECIT/NULECIT) IV 125 mg (02/25/18 1410)  . sodium chloride     .  stroke: mapping our early stages of recovery book   Does not apply Once  . amLODipine  10 mg Per Tube Daily  . calcium acetate  1,334 mg Oral TID WC  . chlorhexidine  15 mL Mouth Rinse BID  . Chlorhexidine Gluconate Cloth  6 each Topical Q0600  . cloNIDine  0.3 mg Oral TID  . hydrALAZINE  50 mg Oral Q8H  . insulin aspart  0-15 Units Subcutaneous Q4H  . labetalol  300 mg Oral TID  . mouth rinse  15 mL Mouth Rinse q12n4p  . pantoprazole  40 mg Oral QHS  . senna-docusate  1 tablet Oral BID  . sodium bicarbonate  650 mg Oral TID  . sodium chloride flush  10-40 mL Intracatheter Q12H   sodium chloride, sodium  chloride, sodium chloride, acetaminophen **OR** acetaminophen (TYLENOL) oral liquid 160 mg/5 mL **OR** acetaminophen, alteplase, heparin, hydrALAZINE, labetalol, lidocaine (PF), lidocaine-prilocaine, pentafluoroprop-tetrafluoroeth, RESOURCE THICKENUP CLEAR, sodium chloride flush  Assessment/ Plan:   New ESRD  Appreciate assistance of Dr Arbie Cookey  Will also need CLIP   HTN will follow will remove fluid on dialysis  Metabolic acidosis should  correct with dialysis  Bones  Phos increase   phoslo 2 with meals     PTH 60   Anemia  iron sats  Low will give IV iron  Hemorrhagic CVA with hemiplegia   Plan #2 dialysis in 3/27        LOS: 9 Kathlynn Swofford W @TODAY @2 :44 PM

## 2018-02-25 NOTE — Progress Notes (Addendum)
    Patient comfortable without complains of left hand pain. Palpable thrill in radial cephalic fistula, incision healing well. Right TDC working well s/p HD  A/P ESRD s/p left forearm AV fistula creation and TDC placement Patent left AV fistula with palpable thrill We will schedule an out patient f/u in 4-6 weeks for a duplex of the fistula to determine size and depth.  Do not stick fistula for at least 12 weeks.   [name] PigeonEmma Maureen Creed Kail PA-C

## 2018-02-25 NOTE — Progress Notes (Signed)
HD tx initiated via HD cath w/o problem, pull/push/flush equally w/o problem, VSS w/ increased BP, will cont to monitor while on HD tx

## 2018-02-25 NOTE — Progress Notes (Signed)
Pt had a large liquid stool which spread all over the bed on to the floor and under the bed; bariatric bed ordered; MD notified and new order received for rectal tube. Pt informed and voices understanding. Dionne BucyP. Amo Lylith Bebeau RN

## 2018-02-25 NOTE — Progress Notes (Signed)
Flexiseal rectal tube inserted per order. Dionne BucyP. Amo Jemari Hallum RN

## 2018-02-25 NOTE — Progress Notes (Signed)
Physical Therapy Treatment Patient Details Name: [name] [name] MRN: [id number] DOB: 1966/03/26 Today's Date: 02/25/2018    History of Present Illness 52 yr old male with sig history of CKD Stage Iv, HTN, DM, HFpEF, presents with an acute ICH located in the right basal ganglia with midline shift and surrounding edema. s/p HD catheter placed 3/25.    PT Comments    Patient not progressing well today secondary to reports of dizziness and back pain sitting EOB. Pt did have HD that ended early this AM and has not been upright in a few days. Tolerated sitting EOB with Max A for support. Worked on facilitating upright posture and midline but pt unable without UE support and not able to sustain. Distracted by dizziness, back pain and keeping eyes closed most of session. Will continue to follow.   Follow Up Recommendations  SNF;Supervision/Assistance - 24 hour     Equipment Recommendations  Wheelchair (measurements PT);Hospital bed;Wheelchair cushion (measurements PT)    Recommendations for Other Services       Precautions / Restrictions Precautions Precautions: Fall Precaution Comments: SBP <180 Restrictions Weight Bearing Restrictions: No    Mobility  Bed Mobility Overal bed mobility: Needs Assistance Bed Mobility: Rolling;Sidelying to Sit;Sit to Supine Rolling: Max assist;+2 for physical assistance Sidelying to sit: Total assist;+2 for physical assistance   Sit to supine: Total assist;+2 for physical assistance   General bed mobility comments: Flexd left knee and assisted with providing feedback into LLE to assist with scooting bottom to EOB. Use of RUE to reach for rail. Total A to elevate trunk and scoot bottomt o EOB. Total A to return to supine and reposition.  Transfers                 General transfer comment: Did not attempt due to safety concerns and dizziness.  Ambulation/Gait             General Gait Details: unable to perform   Stairs             Wheelchair Mobility    Modified Rankin (Stroke Patients Only) Modified Rankin (Stroke Patients Only) Pre-Morbid Rankin Score: No symptoms Modified Rankin: Severe disability     Balance Overall balance assessment: Needs assistance Sitting-balance support: Feet supported;Single extremity supported Sitting balance-Leahy Scale: Poor Sitting balance - Comments: Pt with heavy left lateral lean in sitting with some initiation to correct to midline with external support and cues. Eyes remained closed due to dizzines. Minimally able to initiate spinal extensors but not sustain. Unable to maintain midline without UE support and Mod A.  Postural control: Left lateral lean                                  Cognition Arousal/Alertness: Awake/alert Behavior During Therapy: Flat affect Overall Cognitive Status: Impaired/Different from baseline Area of Impairment: Attention;Problem solving;Safety/judgement;Following commands                   Current Attention Level: Sustained   Following Commands: Follows one step commands consistently;Follows one step commands with increased time Safety/Judgement: Decreased awareness of safety;Decreased awareness of deficits   Problem Solving: Slow processing;Decreased initiation;Difficulty sequencing;Requires verbal cues;Requires tactile cues General Comments: A&Ox4. Slow to intiate all movement and requires cues for problem solving. Distracted by dizziness and back pain sitting EOB. Likes to keep eyes closed.       Exercises      General Comments  General comments (skin integrity, edema, etc.): BP 160/66 sitting EOB.      Pertinent Vitals/Pain Pain Assessment: Faces Faces Pain Scale: Hurts little more Pain Location: back Pain Descriptors / Indicators: Sore;Aching Pain Intervention(s): Monitored during session;Repositioned;Limited activity within patient's tolerance    Home Living                      Prior  Function            PT Goals (current goals can now be found in the care plan section) Progress towards PT goals: Not progressing toward goals - comment(secondary to dizziness, back pain)    Frequency    Min 3X/week      PT Plan Current plan remains appropriate    Co-evaluation              AM-PAC PT "6 Clicks" Daily Activity  Outcome Measure  Difficulty turning over in bed (including adjusting bedclothes, sheets and blankets)?: Unable Difficulty moving from lying on back to sitting on the side of the bed? : Unable Difficulty sitting down on and standing up from a chair with arms (e.g., wheelchair, bedside commode, etc,.)?: Unable Help needed moving to and from a bed to chair (including a wheelchair)?: Total Help needed walking in hospital room?: Total Help needed climbing 3-5 steps with a railing? : Total 6 Click Score: 6    End of Session   Activity Tolerance: Treatment limited secondary to medical complications (Comment);Patient limited by pain(dizziness) Patient left: in bed;with call bell/phone within reach;with bed alarm set Nurse Communication: Mobility status;Need for lift equipment PT Visit Diagnosis: Hemiplegia and hemiparesis;Other abnormalities of gait and mobility (R26.89);Other symptoms and signs involving the nervous system (R29.898) Hemiplegia - Right/Left: Left Hemiplegia - dominant/non-dominant: Non-dominant Hemiplegia - caused by: Nontraumatic SAH     Time: [id number] PT Time Calculation (min) (ACUTE ONLY): 30 min  Charges:  $Therapeutic Activity: 8-22 mins $Neuromuscular Re-education: 8-22 mins                    G Codes:       Mylo RedShauna Adelheid Hoggard, PT, DPT [phone]     Blake DivineShauna A Jakaiden Fill 02/25/2018, 11:59 AM

## 2018-02-25 NOTE — Progress Notes (Signed)
  Speech Language Pathology Treatment: Dysphagia;Cognitive-Linquistic  Patient Details Name: [name] ChanceReginald F Montz MRN: [id number] DOB: 12/07/1965 Today's Date: 02/25/2018 Time: [id number] SLP Time Calculation (min) (ACUTE ONLY): 17 min  Assessment / Plan / Recommendation Clinical Impression  Pt continues to need Mod-Max cues for emergent awareness of acute impairments, but has improving intellectual awareness, needing only Min cues. SLP provided Min cues for use of swallowing strategies, particularly lingual sweep to manage buccal pocketing. Oral preparation with soft solids is only mildly labored today. Audible swallow with concern for discoordinated swallow was noted with thin and nectar thick liquids, but only one throat clear was noted with thin liquid trials. Recommend repeating FEES to assess for potential improvements in swallowing. Will f/u for scheduling.   HPI HPI: Pt reports around 6PM last night, he was sitting ina desk chair and dropped his phoneand slipped to the floor. His family tried to get him up but he refused syainghe wsa fine and would get up. He was still in the floor today and so the called EMS.Now he endorses mild headacheand mild nausea.BP was >200 systolic on arrival. He was started on Cerdene gtt at [facility] and later switched to Kleveprix drip - but still not under control on arrival [facility].CT confirmed Right basal ganglia hemorrhage.       SLP Plan  Other (Comment)(repeat FEES)       Recommendations  Diet recommendations: Dysphagia 1 (puree);Nectar-thick liquid Liquids provided via: Cup;No straw;Teaspoon Medication Administration: Crushed with puree Supervision: Patient able to self feed;Full supervision/cueing for compensatory strategies Compensations: Minimize environmental distractions;Slow rate;Small sips/bites;Monitor for anterior loss;Lingual sweep for clearance of pocketing Postural Changes and/or Swallow Maneuvers: Seated upright 90 degrees                 Oral Care Recommendations: Oral care BID Follow up Recommendations: Inpatient Rehab SLP Visit Diagnosis: Dysphagia, oropharyngeal phase (R13.12) Plan: Other (Comment)(repeat FEES)       GO                [name], Yaiza Palazzola 02/25/2018, 3:39 PM  [name], M.A. CCC-SLP [phone]

## 2018-02-25 NOTE — Care Management Note (Signed)
Case Management Note  Patient Details  Name: [name] [name] MRN: [id number] Date of Birth: 1966/02/18  Subjective/Objective:                    Action/Plan: Plan is for SNF once patient has been Clipped to outside HD center. CM following.   Expected Discharge Date:                  Expected Discharge Plan:  Skilled Nursing Facility  In-House Referral:     Discharge planning Services  CM Consult  Post Acute Care Choice:    Choice offered to:     DME Arranged:    DME Agency:     HH Arranged:    HH Agency:     Status of Service:  In process, will continue to follow  If discussed at Long Length of Stay Meetings, dates discussed:    Additional Comments:  Kermit BaloKelli F Apryl Brymer, RN 02/25/2018, 2:35 PM

## 2018-02-25 NOTE — Progress Notes (Signed)
HD tx completed @ 0500 w/o problem, UF goal met, blood rinsed back, VSS, report called to Jami Steelman, RN 

## 2018-02-25 NOTE — Progress Notes (Addendum)
STROKE TEAM PROGRESS NOTE   SUBJECTIVE (INTERVAL HISTORY) No family at bedside. Patient lying in bed without distress but sleepy. He had shunt and catheter placed yesterday. received HD during the night. BP remains elevated, received IV labetalol during the night. Diet also ordered with thin liquids post op, will update. No new complaints.     CBC:  Recent Labs  Lab 02/19/18 0446 02/20/18 0416  02/24/18 2050 02/25/18 0321  WBC 7.3 6.6   < > 8.3 6.5  NEUTROABS 4.9 4.1  --   --   --   HGB 8.7* 9.0*   < > 9.1* 8.6*  HCT 28.2* 29.6*   < > 30.1* 29.1*  MCV 85.5 85.3   < > 84.3 84.3  PLT 237 229   < > 204 192   < > = values in this interval not displayed.    Basic Metabolic Panel:  Recent Labs  Lab 02/19/18 0446  02/24/18 2050 02/25/18 0321  NA 149*   < > 151* 152*  K 5.1   < > 5.9* 5.7*  CL 123*   < > 123* 121*  CO2 18*   < > 20* 21*  GLUCOSE 123*   < > 216* 181*  BUN 51*   < > 51* 53*  CREATININE 5.26*   < > 5.15* 4.96*  CALCIUM 8.1*   < > 8.7* 8.5*  PHOS 6.8*  --  5.9*  --    < > = values in this interval not displayed.    IMAGING  Ct Head Wo Contrast 02/17/2018 IMPRESSION:  1. No significant interval change in size and appearance of right basal ganglia intraparenchymal hemorrhage, estimated volume 28 mL. Regional mass effect with localized 7 mm right-to-left shift relatively stable. No hydrocephalus or ventricular trapping.  2. Evolving left frontotemporal scalp contusion.  3. No other acute intracranial abnormality.  4. Underlying atrophy with chronic small vessel ischemic disease and remote right PCA territory infarct.   Ct Head Wo Contrast 02/16/2018   IMPRESSION:  1. Acute right basal ganglia hemorrhage with estimated blood volume of 28 mL. Underlying chronic cerebral ischemia, including previous right PCA infarct.  2. Surrounding edema with regional mass effect including leftward midline shift up to 9 mm. Basilar cisterns are patent.  3. Subtotal effacement of  the right lateral ventricle, but no intraventricular extension of blood and no ventriculomegaly.  4. Large left scalp hematoma. No underlying skull fracture identified.   Ct Cervical Spine Wo Contrast 02/16/2018   IMPRESSION:  No acute fracture in the cervical spine.   [place] Renal 02/18/2018 IMPRESSION:  No hydronephrosis.  Left lower pole 9.8 mm nonobstructing stone.   Dg Chest Port 1 View 02/23/2018 IMPRESSION:  No acute abnormality. Stable cardiomegaly, mild pulmonary vascular congestion and minimal chronic interstitial lung disease.   Dg Chest 1 View 02/16/2018 IMPRESSION:  1. Confluent left lower lobe opacity compatible with collapse or consolidation.  2. Right lung ventilation is within normal limits.  3. Chronic cardiomegaly.   Dg Chest Port 1 View 02/16/2018 IMPRESSION:  1. Unchanged retrocardiac opacity which may be combination of pleural fluid and atelectasis or airspace disease.  2. Unchanged cardiomegaly.  3. Mild central bronchial thickening.   Dg Chest Port 1 View 02/16/2018 IMPRESSION:  Central catheter tip in superior vena cava. No pneumothorax. Stable cardiomegaly. No edema or consolidation.   Dg Pelvis 1-2 Views 02/16/2018 IMPRESSION:  No acute fracture or dislocation identified about the pelvis.   Dg Abd Portable 1v 02/17/2018 IMPRESSION:  Tip and side port of the enteric tube below the diaphragm in the stomach.   Dg Abd Portable 1v 02/17/2018 IMPRESSION:  Tip of the enteric tube remains in the region of the distal esophagus, recommend advancement of an additional 10 cm to place the side-port below the diaphragm.   Dg Abd Portable 1v 02/17/2018 IMPRESSION:  Tip of the enteric tube in the distal esophagus, recommend advancement of least 10 cm to place the side-port below the diaphragm.   Dg Chest Port 1v Same Day Result Date: 02/24/2018 Cardiomegaly without edema or consolidation. Central catheter tip at cavoatrial junction. No pneumothorax.      Physical exam:  Vitals:   02/25/18 0550 02/25/18 0808 02/25/18 0923 02/25/18 0943  BP: (!) 176/79 (!) 195/89 (!) 182/84 (!) 173/82  Pulse:  73 89 74  Resp:  20    Temp:  98.3 F (36.8 C)    TempSrc:  Oral    SpO2:  97%    Weight:      Height:        Exam: Gen: NAD, less conversant  D/t lethargy (in HD during the night) well nourished, morbidly obese, well groomed.  CV: RRR, no MRG. No Carotid Bruits. No peripheral edema, warm, nontender Eyes: Conjunctivae clear without exudates or hemorrhage Extremities: No toes R foot (amputation). L FA w/ graph site CDI w/ +bruit  Neuro: Speech:    Speech is dysarthric with normal comprehension.  Cognition:    The patient is oriented to person, month and year Cranial Nerves:    The pupils are equal, round, and reactive to light.  Visual fields are full to finger confrontation. Right gaze preference but can cross the midline. Left lower face weakness. Left lower qudrantanopia.  Hearing intact. Voice is normal. Shoulder shrug is normal.   Coordination:    No dysmetria  Motor Observation:  no involuntary movements noted. Tone:    Decreased muscle tone left.    Strength and sensation    Dense left hemiparesis and hemisensory loss, withdraws minimally on left, leg > arm.      Reflex Exam: Toes: Left upgoing     ASSESSMENT/PLAN [name] with history of hypertension, chronic kidney disease, diabetes, and right transmetatarsal amputation presenting with headache, nausea, and elevated blood pressure. He did not receive IV t-PA due to ICH.  Right BG ICH secondary to uncontrolled hypertension in the setting of CKD stage V  Resultant  Right gaze preference, L lower quadrant field cut, L hemiplegia, dysphagia  CT head 02/16/18  Acute right basal ganglia hemorrhage with estimated blood volume of 28 mL.  CT head 02/17/18 R BG IPH w/ 7mm R to L shift. Evolving L frontotemporal scalp contusion. Small vessel  disease. Atrophy. 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  2D Echo - Decreased EF 45-50%. Diffuse hypokinesis.  LDL - 108   HgbA1c - 6.4  VTE prophylaxis - SCDs d/c'd, added lovenox (safe as hmg now stable) Fall precautions DIET - DYS 1 Room service appropriate? Yes; Fluid consistency: Nectar Thick. ST following  No antithrombotic prior to admission, now on No antithrombotic d/t hmg  Ongoing aggressive stroke risk factor management  Therapy recommendations:  SNF  Disposition: SNF pending medical stability from renal standpoint  Induced hypernatremia for Cerebral Edema  Treated with hypertonic saline, now off  Sodium 150->151  Continue to monitor  Hypertensive Emergency  BP on arrival 251/158. Initially treated with  cardene, then changed to cleviprex. Drip now off.  SBP goal < 180  PTA meds: norvasc 10, lasix 40, labetalol 200 bid  Now on:  norvasc 10, clonidine 0.3 tid, labetolol 300 tid  BP  > 180 4 times during the night. Last dose labetalol 1940 last night. Discussed with RN. To receive additional dose this am Added hydralazine 50 q 8 hours  1st HD 02/25/2018. Hope HD will improve overall BP Monitor BP Long-term BP goal normotensive  CKD stage V  Renal US - No hydronephrosis. Left lower pole 9.8 mm nonobstructing stone.  Elevated creatinine on bicarb supplement. 47/4.55   Nephrology consulted   VVS placed permacath and L FA shunt 02/24/2018  Fist dialysis 02/25/2017  Saline at 10/hr. No need from stroke standpoint  Dr. Hyman Hopes following  Hyperlipidemia  Lipid lowering medication PTA:  none  LDL 108  Consider statin at discharge   Type II Diabetes  HgbA1c 6.4, at goal < 7.0  CBGs 120-147  Controlled  SSI  Other Stroke Risk Factors  Morbid Obesity, Body mass index is 48.18 kg/m., recommend weight loss, diet and exercise as appropriate   Hx Stroke:  Previous right PCA infarct by imaging.  Hx diastolic  dysfuction. No CHF. EF 45 - 50%  Other Active Problems  Anemia 9.0->9.3->8.6 - likely due to CKD  Hyperkalemia, 5.9->5.7->5.3    Hospital day # 9  Annie Main, NP [facility] See Amion for Pager information 02/25/2018 10:44 AM   ATTENDING NOTE: I reviewed above note and agree with the assessment and plan. I have made any additions or clarifications directly to the above note. Pt was seen and examined.   Pt had HD this am and tolerating well. Neuro stable. Today 3am K still high at 5.7, repeat K at 10am was 5.3. Will give 30g of Kayexalate. BP still high and will add hydralazine po and continue PRNs. CXR yesterday showed no edema or consolidation. Continue current care, appreciate VVS and nephrology help. Working on SNF placement.   Marvel Plan, MD PhD Stroke Neurology 02/25/2018 11:41 AM  To contact Stroke Continuity provider, please refer to [email or web address]. After hours, contact General Neurology

## 2018-02-25 NOTE — Clinical Social Work Note (Signed)
Clinical Social Worker continuing to follow patient and family for support and discharge planning needs.  Patient is awaiting clipping process for outpatient dialysis.  RNCM received phone call providing authorization number for SNF placement from [employer] [phone]([id number] effective today).  Per Dialysis, patient paperwork has not yet been processed to begin clipping assignment.  CSW remains available for support and to facilitate patient discharge needs once patient has been assigned for outpatient dialysis.  Macario GoldsJesse Deland [place], Kentucky[place]CSW [phone]

## 2018-02-26 ENCOUNTER — Other Ambulatory Visit: Payer: Self-pay

## 2018-02-26 LAB — BASIC METABOLIC PANEL
ANION GAP: 9 (ref 5–15)
BUN: 45 mg/dL — ABNORMAL HIGH (ref 6–20)
CALCIUM: 8.8 mg/dL — AB (ref 8.9–10.3)
CO2: 21 mmol/L — ABNORMAL LOW (ref 22–32)
Chloride: 122 mmol/L — ABNORMAL HIGH (ref 101–111)
Creatinine, Ser: 4.8 mg/dL — ABNORMAL HIGH (ref 0.61–1.24)
GFR calc non Af Amer: 13 mL/min — ABNORMAL LOW (ref >60)
GFR, EST AFRICAN AMERICAN: 15 mL/min — AB (ref >60)
GLUCOSE: 136 mg/dL — AB (ref 65–99)
Potassium: 4.6 mmol/L (ref 3.5–5.1)
Sodium: 152 mmol/L — ABNORMAL HIGH (ref 135–145)

## 2018-02-26 LAB — GLUCOSE, CAPILLARY
Glucose-Capillary: 117 mg/dL — ABNORMAL HIGH (ref 65–99)
Glucose-Capillary: 128 mg/dL — ABNORMAL HIGH (ref 65–99)
Glucose-Capillary: 131 mg/dL — ABNORMAL HIGH (ref 65–99)
Glucose-Capillary: 141 mg/dL — ABNORMAL HIGH (ref 65–99)
Glucose-Capillary: 157 mg/dL — ABNORMAL HIGH (ref 65–99)
Glucose-Capillary: 160 mg/dL — ABNORMAL HIGH (ref 65–99)

## 2018-02-26 LAB — CBC
HCT: 29.5 % — ABNORMAL LOW (ref 39.0–52.0)
Hemoglobin: 8.7 g/dL — ABNORMAL LOW (ref 13.0–17.0)
MCH: 24.9 pg — ABNORMAL LOW (ref 26.0–34.0)
MCHC: 29.5 g/dL — ABNORMAL LOW (ref 30.0–36.0)
MCV: 84.5 fL (ref 78.0–100.0)
PLATELETS: 190 10*3/uL (ref 150–400)
RBC: 3.49 MIL/uL — ABNORMAL LOW (ref 4.22–5.81)
RDW: 16.3 % — AB (ref 11.5–15.5)
WBC: 8.3 10*3/uL (ref 4.0–10.5)

## 2018-02-26 LAB — HEPATITIS B SURFACE ANTIGEN: Hepatitis B Surface Ag: NEGATIVE

## 2018-02-26 LAB — HEPATITIS B CORE ANTIBODY, TOTAL: HEP B C TOTAL AB: NEGATIVE

## 2018-02-26 LAB — HEPATITIS B SURFACE ANTIBODY,QUALITATIVE: HEP B S AB: NONREACTIVE

## 2018-02-26 MED ORDER — HYDRALAZINE HCL 50 MG PO TABS
100.0000 mg | ORAL_TABLET | Freq: Three times a day (TID) | ORAL | Status: DC
  Administered 2018-02-26 – 2018-03-06 (×21): 100 mg via ORAL
  Filled 2018-02-26 (×23): qty 2

## 2018-02-26 NOTE — Progress Notes (Signed)
[facility] Progress Note   52 year old with uncontrolled hypertension and diabetes and progressive renal failure    He presented with acute basal ganglion hemorrhage and was found to have progressed to stage 5 chronic renal disease and will need dialysis  Appreciate assistance from Dr Early   AVF Left and Dialysis catheter  He has a Right field cut and left hemiplegia    As a result of the bleed  He has a decreased EF  45 %  Diffuse hypokinesia    Assessment/ Plan:    New ESRD Appreciate assistance of Dr Arbie Cookey Will also need CLIP --> [facility] TTS 1st outpt HD Tues.  - Next HD Mon (he can get 4 treatments in that first week with e/o hypervolemia); he's already been dialyzed 3 out of the past 4 days.  HTN will follow will remove fluid on dialysis  Metabolic acidosis should correct with dialysis  Bones Phos increase phoslo 2 with meals    PTH 60   Anemia iron sats  Low will give IV iron  Hemorrhagic CVA with hemiplegia   Seen on HD #3 today Qb/qd 300/600 3K bath 179/98  UF 4.5L (net 4L0   Subjective:   Denies any f/c/n/v/dyspnea.   Objective:   BP (!) 191/93   Pulse 85   Temp 99 F (37.2 C) (Oral)   Resp 18   Ht 6' 2.02" (1.88 m)   Wt (!) 171.2 kg (377 lb 6.8 oz)   SpO2 95%   BMI 48.44 kg/m   Intake/Output Summary (Last 24 hours) at 02/26/2018 1621 Last data filed at 02/26/2018 1259 Gross per 24 hour  Intake 66.67 ml  Output 225 ml  Net -158.33 ml   Weight change: -7.1 kg (-15 lb 10.4 oz)  Physical Exam: GEN - NAD, pleasant CVS- RRR RS- CTA ABD- BS present soft non-distended EXT- Anasarca Lower extremities, rt TMA, lt cimino good bruit, left arm swollen   Imaging: No results found.  Labs: BMET Recent Labs  Lab 02/22/18 8119 02/23/18 0515 02/24/18 0426 02/24/18 2050 02/25/18 0321 02/25/18 0958 02/26/18 0752  NA 150* 152* 151* 151* 152* 151* 152*  K 4.9 5.0 5.2* 5.9* 5.7* 5.3* 4.6  CL 119* 122* 121* 123* 121*  121* 122*  CO2 19* 20* 21* 20* 21* 23 21*  GLUCOSE 123* 125* 136* 216* 181* 157* 136*  BUN 45* 45* 46* 51* 53* 47* 45*  CREATININE 4.96* 4.92* 4.88* 5.15* 4.96* 4.55* 4.80*  CALCIUM 8.4* 8.8* 8.9 8.7* 8.5* 8.6* 8.8*  PHOS  --   --   --  5.9*  --   --   --    CBC Recent Labs  Lab 02/20/18 0416  02/24/18 0426 02/24/18 2050 02/25/18 0321 02/26/18 0752  WBC 6.6   < > 8.4 8.3 6.5 8.3  NEUTROABS 4.1  --   --   --   --   --   HGB 9.0*   < > 9.5* 9.1* 8.6* 8.7*  HCT 29.6*   < > 30.5* 30.1* 29.1* 29.5*  MCV 85.3   < > 84.7 84.3 84.3 84.5  PLT 229   < > 195 204 192 190   < > = values in this interval not displayed.    Medications:    .  stroke: mapping our early stages of recovery book   Does not apply Once  . amLODipine  10 mg Per Tube Daily  . calcium acetate  1,334 mg Oral TID WC  .  chlorhexidine  15 mL Mouth Rinse BID  . Chlorhexidine Gluconate Cloth  6 each Topical Q0600  . cloNIDine  0.3 mg Oral TID  . hydrALAZINE  50 mg Oral Q8H  . insulin aspart  0-15 Units Subcutaneous Q4H  . labetalol  300 mg Oral TID  . mouth rinse  15 mL Mouth Rinse q12n4p  . pantoprazole  40 mg Oral QHS  . senna-docusate  1 tablet Oral BID  . sodium bicarbonate  650 mg Oral TID  . sodium chloride flush  10-40 mL Intracatheter Q12H      Paulene FloorJames Miriana Gaertner, MD 02/26/2018, 4:21 PM

## 2018-02-26 NOTE — Procedures (Signed)
Objective Swallowing Evaluation: Type of Study: FEES-Fiberoptic Endoscopic Evaluation of Swallow   Patient Details  Name: [name] [name] MRN: [id number] Date of Birth: [birth year]  Today's Date: 02/26/2018 Time: SLP Start Time (ACUTE ONLY): 1332 -SLP Stop Time (ACUTE ONLY): 1359  SLP Time Calculation (min) (ACUTE ONLY): 27 min   Past Medical History:  Past Medical History:  Diagnosis Date  . Cellulitis and abscess of leg 01/08/2014   RT LEG  . Diabetes mellitus   . Diverticulitis   . Hypertension    Past Surgical History:  Past Surgical History:  Procedure Laterality Date  . AV FISTULA PLACEMENT Left 02/24/2018   Procedure: ARTERIOVENOUS (AV) FISTULA CREATION;  Surgeon: Larina Earthly, MD;  Location: [facility] OR;  Service: Vascular;  Laterality: Left;  . COLON SURGERY    . I&D EXTREMITY Right 01/09/2014   Procedure: IRRIGATION AND DEBRIDEMENT EXTREMITY;  Surgeon: Senaida Lange, MD;  Location: [facility] OR;  Service: Orthopedics;  Laterality: Right;  . INSERTION OF DIALYSIS CATHETER Right 02/24/2018   Procedure: INSERTION OF DIALYSIS CATHETER - RIGHT INTERNAL JUGULAR PLACEMENT;  Surgeon: Larina Earthly, MD;  Location: [facility] OR;  Service: Vascular;  Laterality: Right;  . TRANSMETATARSAL AMPUTATION Right 01/15/2014   Procedure: TRANSMETATARSAL AMPUTATION;  Surgeon: Nadara Mustard, MD;  Location: [facility] OR;  Service: Orthopedics;  Laterality: Right;   HPI: Pt is a 52 y.o.malewith history of hypertension, chronic kidney disease, diabetes, and right transmetatarsal amputationpresenting with headache, nausea, and elevated blood pressure, found to haveacute ICH located in the right basal ganglia with midline shift and surrounding edema.    Subjective: pt is drowsy this afternoon    Assessment / Plan / Recommendation  CHL IP CLINICAL IMPRESSIONS 02/26/2018  Clinical Impression Pt shows mild improvements from his initial FEES on 3/18, evidenced by more consistent oral containment before the swallow with  nectar thick liquids and purees. As a result, his swallow is triggered in a more timely manner and he achieves good airway protection. Thin liquids still spill posteriorly into the pharynx/larynx before the swallow, although only intermittently - likely due to reduced sensation and awareness. When thin liquids spill below the vocal folds he does elicit a delayed cough that is likely stronger than the throat clearing that was spontaneously elicited on prior testing. A chin tuck helps to achieve better oral containment and timing of swallow with cup sips, eliminating further airway compromise. However, pt's alertness continues to fluctuate, and I worry across a meal about how he will be able to maintain his airway protection with advanced liquids. Recommend continuing Dys 1 diet and nectar thick liquids but also allowing plain, thin water in between meals per the water protocol (wait 30 minutes after other intake, clean his mouth well, then try water only by cup with use of a chin tuck). As his alertness and endurance improve, he could work with SLP toward incorporating thin liquids into meals as well. Pt would also benefit from additional solid trials with SLP given reduced oral clearance with crackers today, requiring liquid washes for any the masticated cracker to reach his pharynx.  SLP Visit Diagnosis Dysphagia, oropharyngeal phase (R13.12)  Attention and concentration deficit following --  Frontal lobe and executive function deficit following --  Impact on safety and function Moderate aspiration risk      CHL IP TREATMENT RECOMMENDATION 02/26/2018  Treatment Recommendations Therapy as outlined in treatment plan below     Prognosis 02/26/2018  Prognosis for Safe Diet Advancement Good  Barriers to  Reach Goals Cognitive deficits  Barriers/Prognosis Comment --    CHL IP DIET RECOMMENDATION 02/26/2018  SLP Diet Recommendations Dysphagia 1 (Puree) solids;Nectar thick liquid;Other (Comment)  Liquid  Administration via Cup  Medication Administration Crushed with puree  Compensations Minimize environmental distractions;Slow rate;Small sips/bites;Monitor for anterior loss;Lingual sweep for clearance of pocketing  Postural Changes Seated upright at 90 degrees      CHL IP OTHER RECOMMENDATIONS 02/26/2018  Recommended Consults --  Oral Care Recommendations Oral care BID;Other (Comment)  Other Recommendations Order thickener from pharmacy;Prohibited food (jello, ice cream, thin soups)      CHL IP FOLLOW UP RECOMMENDATIONS 02/26/2018  Follow up Recommendations Inpatient Rehab      CHL IP FREQUENCY AND DURATION 02/26/2018  Speech Therapy Frequency (ACUTE ONLY) min 2x/week  Treatment Duration 2 weeks           CHL IP ORAL PHASE 02/26/2018  Oral Phase Impaired  Oral - Pudding Teaspoon --  Oral - Pudding Cup --  Oral - Honey Teaspoon NT  Oral - Honey Cup NT  Oral - Nectar Teaspoon NT  Oral - Nectar Cup Decreased bolus cohesion;Premature spillage  Oral - Nectar Straw --  Oral - Thin Teaspoon NT  Oral - Thin Cup Decreased bolus cohesion;Premature spillage  Oral - Thin Straw Decreased bolus cohesion;Premature spillage  Oral - Puree Decreased bolus cohesion;Delayed oral transit  Oral - Mech Soft Delayed oral transit;Decreased bolus cohesion;Impaired mastication;Lingual/palatal residue;Left pocketing in lateral sulci  Oral - Regular --  Oral - Multi-Consistency --  Oral - Pill --  Oral Phase - Comment --    CHL IP PHARYNGEAL PHASE 02/26/2018  Pharyngeal Phase Impaired  Pharyngeal- Pudding Teaspoon --  Pharyngeal --  Pharyngeal- Pudding Cup --  Pharyngeal --  Pharyngeal- Honey Teaspoon NT  Pharyngeal --  Pharyngeal- Honey Cup NT  Pharyngeal --  Pharyngeal- Nectar Teaspoon NT  Pharyngeal --  Pharyngeal- Nectar Cup Delayed swallow initiation-vallecula  Pharyngeal Material does not enter airway  Pharyngeal- Nectar Straw --  Pharyngeal --  Pharyngeal- Thin Teaspoon NT  Pharyngeal  --  Pharyngeal- Thin Cup Delayed swallow initiation-vallecula;Delayed swallow initiation-pyriform sinuses;Penetration/Aspiration before swallow;Compensatory strategies attempted (with notebox)  Pharyngeal Material enters airway, passes BELOW cords and not ejected out despite cough attempt by patient  Pharyngeal- Thin Straw Delayed swallow initiation-vallecula;Delayed swallow initiation-pyriform sinuses;Penetration/Aspiration before swallow;Compensatory strategies attempted (with notebox)  Pharyngeal Material enters airway, passes BELOW cords and not ejected out despite cough attempt by patient  Pharyngeal- Puree WFL  Pharyngeal --  Pharyngeal- Mechanical Soft Other (Comment)  Pharyngeal --  Pharyngeal- Regular --  Pharyngeal --  Pharyngeal- Multi-consistency --  Pharyngeal --  Pharyngeal- Pill --  Pharyngeal --  Pharyngeal Comment --     CHL IP CERVICAL ESOPHAGEAL PHASE 02/26/2018  Cervical Esophageal Phase WFL  Pudding Teaspoon --  Pudding Cup --  Honey Teaspoon --  Honey Cup --  Nectar Teaspoon --  Nectar Cup --  Nectar Straw --  Thin Teaspoon --  Thin Cup --  Thin Straw --  Puree --  Mechanical Soft --  Regular --  Multi-consistency --  Pill --  Cervical Esophageal Comment --    No flowsheet data found.  [name], Glady Ouderkirk 02/26/2018, 4:34 PM    [name], M.A. CCC-SLP [phone]

## 2018-02-26 NOTE — Progress Notes (Signed)
Occupational Therapy Treatment Patient Details Name: [name] [name] MRN: [id number] DOB: [birth year] Today's Date: 02/26/2018    History of present illness 52 yr old male with sig history of CKD Stage Iv, HTN, DM, HFpEF, presents with an acute ICH located in the right basal ganglia with midline shift and surrounding edema. s/p HD catheter placed 3/25.   OT comments  Pt very fatigued this pm, and unable to stay awake despite multiple attempts to engage pt.  He would fall asleep mid sentence.  Unable to safely attempt EOB activity due to this.  He requires mod - max A for simple grooming.  PROM of Lt UE performed.    Follow Up Recommendations  SNF    Equipment Recommendations  None recommended by OT    Recommendations for Other Services      Precautions / Restrictions Precautions Precautions: Fall Precaution Comments: SBP <180       Mobility Bed Mobility               General bed mobility comments: unable to safely attempt due to pt lethargy   Transfers                 General transfer comment: unable to safely attempt due to pt lethargy     Balance                                           ADL either performed or assessed with clinical judgement   ADL Overall ADL's : Needs assistance/impaired     Grooming: Moderate assistance;Bed level                                 General ADL Comments: Pt limited by lethargy      Vision   Additional Comments: Pt reports nsg removed his contacts prior to placement of HD cath, and did not give them back.  Encouraged him to have family bring in glasses    Perception     Praxis      Cognition Arousal/Alertness: Lethargic Behavior During Therapy: Flat affect Overall Cognitive Status: Impaired/Different from baseline Area of Impairment: Attention;Following commands                   Current Attention Level: Focused   Following Commands: Follows one step commands  inconsistently       General Comments: Pt with focused attention due to lethargy.  Unable to sustain arousal         Exercises Other Exercises Other Exercises: PROM performed Lt hand, wrist, elbow, and shoulder flexion x 10 each    Shoulder Instructions       General Comments      Pertinent Vitals/ Pain       Pain Assessment: No/denies pain Faces Pain Scale: No hurt  Home Living                                          Prior Functioning/Environment              Frequency  Min 3X/week        Progress Toward Goals  OT Goals(current goals can now be found in the care plan section)  Progress towards  OT goals: Not progressing toward goals - comment(due to lethargy )     Plan Discharge plan needs to be updated    Co-evaluation                 AM-PAC PT "6 Clicks" Daily Activity     Outcome Measure   Help from another person eating meals?: A Lot Help from another person taking care of personal grooming?: A Lot Help from another person toileting, which includes using toliet, bedpan, or urinal?: Total Help from another person bathing (including washing, rinsing, drying)?: Total Help from another person to put on and taking off regular upper body clothing?: Total Help from another person to put on and taking off regular lower body clothing?: Total 6 Click Score: 8    End of Session    OT Visit Diagnosis: Hemiplegia and hemiparesis Hemiplegia - Right/Left: Left Hemiplegia - dominant/non-dominant: Dominant Hemiplegia - caused by: Nontraumatic intracerebral hemorrhage   Activity Tolerance Patient limited by lethargy   Patient Left in bed;with call bell/phone within reach   Nurse Communication Mobility status        Time: [id number] OT Time Calculation (min): 11 min  Charges: OT General Charges $OT Visit: 1 Visit OT Treatments $Therapeutic Activity: 8-22 mins  Reynolds AmericanWendi Analie Katzman, OTR/L [phone]    Jeani HawkingConarpe, Adamarie Izzo  M 02/26/2018, 4:55 PM

## 2018-02-26 NOTE — Progress Notes (Signed)
[facility] Progress Note   52 year old with uncontrolled hypertension and diabetes and progressive renal failure    He presented with acute basal ganglion hemorrhage and was found to have progressed to stage 5 chronic renal disease and will need dialysis  Appreciate assistance from Dr Early   AVF Left and Dialysis catheter  He has a Right field cut and left hemiplegia    As a result of the bleed  He has a decreased EF  45 %  Diffuse hypokinesia    Assessment/ Plan:    New ESRD Appreciate assistance of Dr Arbie CookeyEarly Will also need CLIP - plan on hd in the AM (Thur) - stopped the NaHCO3; Na should decrease.    HTN will follow will remove fluid on dialysis  Bones Phos increase phoslo 2 with meals    PTH 60   Anemia iron sats  Low will give IV iron  Hemorrhagic CVA with hemiplegia      Subjective:   Denies f/c/n/v. Very weak but pleasant.   Objective:   BP (!) 184/86 (BP Location: Right Arm)   Pulse 82   Temp 98.9 F (37.2 C) (Axillary)   Resp 19   Ht 6' 2.02" (1.88 m)   Wt (!) 171.2 kg (377 lb 6.8 oz)   SpO2 96%   BMI 48.44 kg/m   Intake/Output Summary (Last 24 hours) at 02/26/2018 1631 Last data filed at 02/26/2018 1259 Gross per 24 hour  Intake 66.67 ml  Output 225 ml  Net -158.33 ml   Weight change: -7.1 kg (-15 lb 10.4 oz)  Physical Exam: GEN - Pleasant, cooperative CVS- RRR RS- CTA ABD- BS present soft non-distended EXT-  Anasarca  Lower extremities, rt TMA    ACCESS - lt Cimino w/ good bruit, RIJ TC Neuro: dense left hemiparesis  Imaging: No results found.  Labs: BMET Recent Labs  Lab 02/22/18 [id number] 02/23/18 0515 02/24/18 0426 02/24/18 2050 02/25/18 0321 02/25/18 0958 02/26/18 0752  NA 150* 152* 151* 151* 152* 151* 152*  K 4.9 5.0 5.2* 5.9* 5.7* 5.3* 4.6  CL 119* 122* 121* 123* 121* 121* 122*  CO2 19* 20* 21* 20* 21* 23 21*  GLUCOSE 123* 125* 136* 216* 181* 157* 136*  BUN 45* 45* 46* 51* 53* 47* 45*   CREATININE 4.96* 4.92* 4.88* 5.15* 4.96* 4.55* 4.80*  CALCIUM 8.4* 8.8* 8.9 8.7* 8.5* 8.6* 8.8*  PHOS  --   --   --  5.9*  --   --   --    CBC Recent Labs  Lab 02/20/18 0416  02/24/18 0426 02/24/18 2050 02/25/18 0321 02/26/18 0752  WBC 6.6   < > 8.4 8.3 6.5 8.3  NEUTROABS 4.1  --   --   --   --   --   HGB 9.0*   < > 9.5* 9.1* 8.6* 8.7*  HCT 29.6*   < > 30.5* 30.1* 29.1* 29.5*  MCV 85.3   < > 84.7 84.3 84.3 84.5  PLT 229   < > 195 204 192 190   < > = values in this interval not displayed.    Medications:    .  stroke: mapping our early stages of recovery book   Does not apply Once  . amLODipine  10 mg Per Tube Daily  . calcium acetate  1,334 mg Oral TID WC  . chlorhexidine  15 mL Mouth Rinse BID  . Chlorhexidine Gluconate Cloth  6 each Topical Q0600  . cloNIDine  0.3 mg Oral TID  . hydrALAZINE  50 mg Oral Q8H  . insulin aspart  0-15 Units Subcutaneous Q4H  . labetalol  300 mg Oral TID  . mouth rinse  15 mL Mouth Rinse q12n4p  . pantoprazole  40 mg Oral QHS  . senna-docusate  1 tablet Oral BID  . sodium chloride flush  10-40 mL Intracatheter Q12H      52 year old, 02/26/2018, 4:31 PM

## 2018-02-26 NOTE — Progress Notes (Addendum)
STROKE TEAM PROGRESS NOTE   SUBJECTIVE (INTERVAL HISTORY) No family at bedside. Pt states he is the same, no better, just the same. BP continues to be elevated.     CBC:  Recent Labs  Lab 02/20/18 0416  02/25/18 0321 02/26/18 0752  WBC 6.6   < > 6.5 8.3  NEUTROABS 4.1  --   --   --   HGB 9.0*   < > 8.6* 8.7*  HCT 29.6*   < > 29.1* 29.5*  MCV 85.3   < > 84.3 84.5  PLT 229   < > 192 190   < > = values in this interval not displayed.    Basic Metabolic Panel:  Recent Labs  Lab 02/24/18 2050  02/25/18 0958 02/26/18 0752  NA 151*   < > 151* 152*  K 5.9*   < > 5.3* 4.6  CL 123*   < > 121* 122*  CO2 20*   < > 23 21*  GLUCOSE 216*   < > 157* 136*  BUN 51*   < > 47* 45*  CREATININE 5.15*   < > 4.55* 4.80*  CALCIUM 8.7*   < > 8.6* 8.8*  PHOS 5.9*  --   --   --    < > = values in this interval not displayed.    IMAGING  No results found.   Physical exam: Vitals:   02/26/18 0215 02/26/18 0437 02/26/18 0746 02/26/18 0920  BP: (!) 190/85 (!) 181/79 (!) 188/78 (!) 195/85  Pulse:  75 76 85  Resp:  20 18   Temp:  98.6 F (37 C) 99 F (37.2 C)   TempSrc:  Oral Oral   SpO2:  96% 95%   Weight:  (!) 171.2 kg (377 lb 6.8 oz)    Height:        Exam: Gen: NAD, well nourished, morbidly obese, well groomed.  CV: RRR, no MRG. No Carotid Bruits. No peripheral edema, warm, nontender Eyes: Conjunctivae clear without exudates or hemorrhage Extremities: No toes R foot (amputation). L FA w/ graph site CDI w/ +bruit/thrill  Neuro: Speech:    Speech is dysarthric with normal comprehension.  Cognition:    The patient is oriented to person, month and year Cranial Nerves:    The pupils are equal, round, and reactive to light.  Visual fields are full to finger confrontation. Right gaze preference but can cross the midline. Left lower face weakness. Left lower qudrantanopia.  Hearing intact. Voice is normal. Shoulder shrug is normal.   Coordination:    No dysmetria  Motor  Observation:  no involuntary movements noted. Tone:    Decreased muscle tone left.    Strength and sensation    Dense left hemiparesis and hemisensory loss, withdraws minimally on left, leg > arm.      Reflex Exam: Toes: Left upgoing     ASSESSMENT/[name] [name] [name] is a 52 y.o. male with history of hypertension, chronic kidney disease, diabetes, and right transmetatarsal amputation presenting with headache, nausea, and elevated blood pressure. He did not receive IV t-PA due to ICH.  Right BG ICH secondary to uncontrolled hypertension in the setting of CKD stage V  Resultant  Right gaze preference, L lower quadrant field cut, L hemiplegia, dysphagia  CT head 02/16/18  Acute right basal ganglia hemorrhage with estimated blood volume of 28 mL.  CT head 02/17/18 R BG IPH w/ 7mm R to L shift. Evolving L frontotemporal scalp contusion. Small vessel disease.  Atrophy. 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  2D Echo - Decreased EF 45-50%. Diffuse hypokinesis.  LDL - 108   HgbA1c - 6.4  VTE prophylaxis - SCDs d/c'd, added lovenox (safe as hmg now stable) Fall precautions DIET - DYS 1 Room service appropriate? Yes; Fluid consistency: Nectar Thick. ST following  No antithrombotic prior to admission, now on No antithrombotic d/t hmg  Ongoing aggressive stroke risk factor management  Therapy recommendations:  SNF, w/c w/ cushion  Disposition: SNF pending medical stability from renal standpoint  Induced hypernatremia for Cerebral Edema  Treated with hypertonic saline, now off  Sodium 150->152  Continue to monitor  Hypertensive Emergency  BP on arrival 251/158. Initially treated with cardene, then changed to cleviprex. Drip now off.  SBP goal < 180  PTA meds: norvasc 10, lasix 40, labetalol 200 bid  Now on:  norvasc 10, clonidine 0.3 tid, labetolol 300 tid, hydralazine 50 q 8 hours   BP  > 180 since 12 midnight.  Received 1  dose prn hydralazine during the night at 0215. No further treatment. Will discuss with RN. 1st HD 02/25/2018. Hope HD will improve overall BP Monitor BP Long-term BP goal normotensive  CKD stage V  Renal US - No hydronephrosis. Left lower pole 9.8 mm nonobstructing stone.  Elevated creatinine on bicarb supplement. 47/4.55   Nephrology consulted   VVS placed permacath and L FA shunt 02/24/2018  Fist dialysis 02/25/2017  Saline at 10/hr. No need from stroke standpoint  Needs CLIP   Dr. Hyman Hopes following  Hyperlipidemia  Lipid lowering medication PTA:  none  LDL 108  Consider statin at discharge   Type II Diabetes  HgbA1c 6.4, at goal < 7.0  CBGs 128-160  Controlled  SSI  Other Stroke Risk Factors  Morbid Obesity, Body mass index is 48.44 kg/m., recommend weight loss, diet and exercise as appropriate   Hx Stroke:  Previous right PCA infarct by imaging.  Hx diastolic dysfuction. No CHF. EF 45 - 50%  Other Active Problems  Anemia 9.0->9.3->8.7 - due to CKD. Replacing Fe IV  Hyperkalemia, 5.9->5.7->4.6  Given kayexalate yesterday. Large liquid stool last evening.   Hospital day # 10  [name] Main, NP [facility] See Amion for Pager information 02/26/2018 7:58 AM   ATTENDING NOTE: I reviewed above note and agree with the assessment and [name]. I have made any additions or clarifications directly to the above note. Pt was seen and examined.  Pt neuro stable, no acute event overnight. BP still high, continue current BP meds but increase hydralazine to 100mg  tid. K normalized now after given Kayexalate yesterday. HD in am.   [name] Plan, MD PhD Stroke Neurology 02/26/2018 9:45 PM    To contact Stroke Continuity provider, please refer to [email or web address]. After hours, contact General Neurology

## 2018-02-27 ENCOUNTER — Telehealth: Payer: Self-pay | Admitting: Neurology

## 2018-02-27 LAB — GLUCOSE, CAPILLARY
GLUCOSE-CAPILLARY: 139 mg/dL — AB (ref 65–99)
Glucose-Capillary: 146 mg/dL — ABNORMAL HIGH (ref 65–99)
Glucose-Capillary: 106 mg/dL — ABNORMAL HIGH (ref 65–99)
Glucose-Capillary: 123 mg/dL — ABNORMAL HIGH (ref 65–99)
Glucose-Capillary: 123 mg/dL — ABNORMAL HIGH (ref 65–99)

## 2018-02-27 LAB — CBC
HCT: 28.9 % — ABNORMAL LOW (ref 39.0–52.0)
HEMOGLOBIN: 8.7 g/dL — AB (ref 13.0–17.0)
MCH: 25.2 pg — AB (ref 26.0–34.0)
MCHC: 30.1 g/dL (ref 30.0–36.0)
MCV: 83.8 fL (ref 78.0–100.0)
Platelets: 192 10*3/uL (ref 150–400)
RBC: 3.45 MIL/uL — AB (ref 4.22–5.81)
RDW: 16.1 % — ABNORMAL HIGH (ref 11.5–15.5)
WBC: 8.7 10*3/uL (ref 4.0–10.5)

## 2018-02-27 LAB — RENAL FUNCTION PANEL
ANION GAP: 7 (ref 5–15)
Albumin: 2.3 g/dL — ABNORMAL LOW (ref 3.5–5.0)
BUN: 44 mg/dL — ABNORMAL HIGH (ref 6–20)
CALCIUM: 8.7 mg/dL — AB (ref 8.9–10.3)
CHLORIDE: 124 mmol/L — AB (ref 101–111)
CO2: 21 mmol/L — AB (ref 22–32)
Creatinine, Ser: 5.01 mg/dL — ABNORMAL HIGH (ref 0.61–1.24)
GFR calc non Af Amer: 12 mL/min — ABNORMAL LOW (ref >60)
GFR, EST AFRICAN AMERICAN: 14 mL/min — AB (ref >60)
Glucose, Bld: 130 mg/dL — ABNORMAL HIGH (ref 65–99)
Phosphorus: 3.4 mg/dL (ref 2.5–4.6)
Potassium: 4.3 mmol/L (ref 3.5–5.1)
Sodium: 152 mmol/L — ABNORMAL HIGH (ref 135–145)

## 2018-02-27 MED ORDER — HYDRALAZINE HCL 20 MG/ML IJ SOLN
INTRAMUSCULAR | Status: AC
  Filled 2018-02-27: qty 1

## 2018-02-27 MED ORDER — HEPARIN SODIUM (PORCINE) 5000 UNIT/ML IJ SOLN
5000.0000 [IU] | Freq: Three times a day (TID) | INTRAMUSCULAR | Status: DC
  Administered 2018-02-27 – 2018-03-06 (×20): 5000 [IU] via SUBCUTANEOUS
  Filled 2018-02-27 (×23): qty 1

## 2018-02-27 NOTE — Progress Notes (Signed)
Occupational Therapy Treatment Patient Details Name: [name] ChanceReginald F [name] MRN: [id number] DOB: [birth year] Today's Date: 02/27/2018    History of present illness 52 yr old male with sig history of CKD Stage Iv, HTN, DM, HFpEF, presents with an acute ICH located in the right basal ganglia with midline shift and surrounding edema. s/p HD catheter placed 3/25.   OT comments  Pt seen with PT.  Pt very lethargic.  He was lifted to chair via maxi move and total A +2, tolerated well.  Spoke with RN re: need to use lift and to move pt back to bed in ~ 2 hours to prevent presssure.    Follow Up Recommendations  SNF    Equipment Recommendations  None recommended by OT    Recommendations for Other Services      Precautions / Restrictions Precautions Precautions: Fall Precaution Comments: SBP <180       Mobility Bed Mobility Overal bed mobility: Needs Assistance Bed Mobility: Rolling Rolling: Total assist;+2 for physical assistance         General bed mobility comments: Pt lethargic and did not attempt with rolling for maxi move pad placement   Transfers Overall transfer level: Needs assistance Equipment used: Ambulation equipment used             General transfer comment: Pt transferred to recliner with total A +2 using maxi move.  Pt asleep majority of the time, with brief periods of arousal.  Instructed RN to transfer pt back to bed in ~ 2 hours     Balance                                           ADL either performed or assessed with clinical judgement   ADL                                               Vision       Perception     Praxis      Cognition Arousal/Alertness: Lethargic Behavior During Therapy: Flat affect Overall Cognitive Status: Difficult to assess                     Current Attention Level: Focused           General Comments: Pt very lethargic         Exercises     Shoulder Instructions        General Comments      Pertinent Vitals/ Pain       Pain Assessment: No/denies pain  Home Living                                          Prior Functioning/Environment              Frequency  Min 3X/week        Progress Toward Goals  OT Goals(current goals can now be found in the care plan section)  Progress towards OT goals: Not progressing toward goals - comment(lethargy )     Plan Discharge plan remains appropriate    Co-evaluation    PT/OT/SLP Co-Evaluation/Treatment: Yes Reason for Co-Treatment: Complexity  of the patient's impairments (multi-system involvement);For patient/therapist safety   OT goals addressed during session: Strengthening/ROM      AM-PAC PT "6 Clicks" Daily Activity     Outcome Measure   Help from another person eating meals?: A Lot Help from another person taking care of personal grooming?: A Lot Help from another person toileting, which includes using toliet, bedpan, or urinal?: Total Help from another person bathing (including washing, rinsing, drying)?: Total Help from another person to put on and taking off regular upper body clothing?: Total Help from another person to put on and taking off regular lower body clothing?: Total 6 Click Score: 8    End of Session    OT Visit Diagnosis: Hemiplegia and hemiparesis Hemiplegia - Right/Left: Left Hemiplegia - dominant/non-dominant: Dominant Hemiplegia - caused by: Nontraumatic intracerebral hemorrhage   Activity Tolerance Patient limited by lethargy   Patient Left in chair;with call bell/phone within reach   Nurse Communication Mobility status;Need for lift equipment        Time: [phone] OT Time Calculation (min): 31 min  Charges: OT General Charges $OT Visit: 1 Visit OT Treatments $Therapeutic Activity: 8-22 mins  [employer], OTR/L [phone]    Jeani Hawking M 02/27/2018, 4:46 PM

## 2018-02-27 NOTE — Telephone Encounter (Signed)
Noted. Thank you.  [name] PlanJindong Maricruz Lucero, MD PhD Stroke Neurology 02/27/2018 10:12 AM

## 2018-02-27 NOTE — Progress Notes (Addendum)
[facility] Progress Note   52 year old with uncontrolled hypertension and diabetes and progressive renal failure   He presented with acute basal ganglion hemorrhage and was found to have progressed to stage 5 chronic renal disease and will need dialysis  Appreciate assistance fromDr Early AVF Left and Dialysis catheter  He has a Right field cut and left hemiplegia As a result of the bleed  He has a decreased EF 45 % Diffuse hypokinesia    Assessment/ Plan:    New ESRD Appreciate assistance of DrEarlyWill also need CLIP  - HD #2 today, plan on #3 tomorrow and then will resume MWF regimen - Awaiting CLIP (pending)  Seen on HD RIJ TC QB300 2k/2.5Ca   HTN will follow will remove fluid on dialysis  Metabolic acidosis should correct with dialysis  Bones Phos continuephoslo 2 with meals (phos 3/27 3.4)PTH 60   Anemia iron sats Low will give IV iron  Hemorrhagic CVA with hemiplegia     Subjective:   Denies any f/c/n/v/dyspnea.   Objective:   BP (!) 186/90   Pulse 78   Temp 98.4 F (36.9 C) (Oral)   Resp (!) 22   Ht 6' 2.02" (1.88 m)   Wt (!) 164.2 kg (362 lb)   SpO2 96%   BMI 46.46 kg/m   Intake/Output Summary (Last 24 hours) at 02/27/2018 [id number] Last data filed at 02/27/2018 0357 Gross per 24 hour  Intake -  Output 325 ml  Net -325 ml   Weight change: -0.829 kg (-1 lb 13.2 oz)  Physical Exam: GEN - NAD, pleasant CVS- RRR RS- CTA ABD- BS present soft non-distended EXT- Anasarca Lower extremities, rt TMA, lt cimino good bruit, left arm swollen    Imaging: No results found.  Labs: BMET Recent Labs  Lab 02/23/18 0515 02/24/18 [id number] 02/24/18 2050 02/25/18 0321 02/25/18 0958 02/26/18 0752 02/27/18 0638  NA 152* 151* 151* 152* 151* 152* 152*  K 5.0 5.2* 5.9* 5.7* 5.3* 4.6 4.3  CL 122* 121* 123* 121* 121* 122* 124*  CO2 20* 21* 20* 21* 23 21* 21*  GLUCOSE 125* 136* 216* 181* 157* 136* 130*  BUN 45*  46* 51* 53* 47* 45* 44*  CREATININE 4.92* 4.88* 5.15* 4.96* 4.55* 4.80* 5.01*  CALCIUM 8.8* 8.9 8.7* 8.5* 8.6* 8.8* 8.7*  PHOS  --   --  5.9*  --   --   --  3.4   CBC Recent Labs  Lab 02/24/18 2050 02/25/18 0321 02/26/18 0752 02/27/18 0632  WBC 8.3 6.5 8.3 8.7  HGB 9.1* 8.6* 8.7* 8.7*  HCT 30.1* 29.1* 29.5* 28.9*  MCV 84.3 84.3 84.5 83.8  PLT 204 192 190 192    Medications:    .  stroke: mapping our early stages of recovery book   Does not apply Once  . amLODipine  10 mg Per Tube Daily  . calcium acetate  1,334 mg Oral TID WC  . chlorhexidine  15 mL Mouth Rinse BID  . Chlorhexidine Gluconate Cloth  6 each Topical Q0600  . cloNIDine  0.3 mg Oral TID  . heparin injection (subcutaneous)  5,000 Units Subcutaneous Q8H  . hydrALAZINE  100 mg Oral Q8H  . insulin aspart  0-15 Units Subcutaneous Q4H  . labetalol  300 mg Oral TID  . mouth rinse  15 mL Mouth Rinse q12n4p  . pantoprazole  40 mg Oral QHS  . senna-docusate  1 tablet Oral BID  . sodium chloride flush  10-40 mL Intracatheter  Q12H      Paulene Floor, MD 02/27/2018, 9:21 AM

## 2018-02-27 NOTE — Telephone Encounter (Addendum)
Pts wife called stating [facility] her to reach out through our office to get in touch with Dr. Roda ShuttersXu. [name] [name] is aware Dr. Roda ShuttersXu will not be in the office until Monday and is just wanting to touch base with him. Stating she hasn't been able to reach him at the hospital. [name] [name] also made aware that she should reach [facility] to get in contact with Dr. Roda ShuttersXu

## 2018-02-27 NOTE — Progress Notes (Signed)
Physical Therapy Treatment Patient Details Name: [name] [name] MRN: [id number] DOB: [birth year] Today's Date: 02/27/2018    History of Present Illness 52 yr old male with sig history of CKD Stage Iv, HTN, DM, HFpEF, presents with an acute ICH located in the right basal ganglia with midline shift and surrounding edema. s/p HD catheter placed 3/25.    PT Comments    Patient progressing with OOB this session and RN aware to attempt up for 2 hours to allow improved tolerance for potential HD in chair upon d/c.  Also able to active some with L LE into resistance with therex.  Will need STSNF level rehab at d/c.   Follow Up Recommendations  SNF;Supervision/Assistance - 24 hour     Equipment Recommendations  Wheelchair (measurements PT);Hospital bed;Wheelchair cushion (measurements PT)    Recommendations for Other Services       Precautions / Restrictions Precautions Precautions: Fall Precaution Comments: SBP <180    Mobility  Bed Mobility Overal bed mobility: Needs Assistance Bed Mobility: Rolling Rolling: Total assist;+2 for physical assistance         General bed mobility comments: rolled for hygiene and to place lift pad  Transfers Overall transfer level: Needs assistance Equipment used: Ambulation equipment used             General transfer comment: lift from bed to recliner with maximove, RN Aware due to BP 180/77, but pt tolerated well, RN to let pt sit 2 hours for upright tolerance  Ambulation/Gait                 Stairs            Wheelchair Mobility    Modified Rankin (Stroke Patients Only) Modified Rankin (Stroke Patients Only) Pre-Morbid Rankin Score: No symptoms Modified Rankin: Severe disability     Balance Overall balance assessment: Needs assistance   Sitting balance-Leahy Scale: Poor Sitting balance - Comments: sitting in recliner with head righted and without lateral lean                                      Cognition Arousal/Alertness: Lethargic Behavior During Therapy: Flat affect Overall Cognitive Status: Difficult to assess Area of Impairment: Attention;Following commands                   Current Attention Level: Sustained   Following Commands: Follows one step commands with increased time Safety/Judgement: Decreased awareness of deficits   Problem Solving: Slow processing;Decreased initiation;Requires verbal cues General Comments: Pt very lethargic       Exercises Other Exercises Other Exercises: Pushing into extension bilat LE's with resistance, some activation of L LE felt Other Exercises: PROM L UE; use of R UE to lace fingers into L for midline attention and L side awareness    General Comments        Pertinent Vitals/Pain Pain Assessment: No/denies pain    Home Living                      Prior Function            PT Goals (current goals can now be found in the care plan section) Progress towards PT goals: Progressing toward goals(for increased OOB time)    Frequency           PT Plan Current plan remains appropriate    Co-evaluation PT/OT/SLP Co-Evaluation/Treatment: Yes  Reason for Co-Treatment: Complexity of the patient's impairments (multi-system involvement);For patient/therapist safety PT goals addressed during session: Mobility/safety with mobility;Strengthening/ROM OT goals addressed during session: Strengthening/ROM      AM-PAC PT "6 Clicks" Daily Activity  Outcome Measure  Difficulty turning over in bed (including adjusting bedclothes, sheets and blankets)?: Unable Difficulty moving from lying on back to sitting on the side of the bed? : Unable Difficulty sitting down on and standing up from a chair with arms (e.g., wheelchair, bedside commode, etc,.)?: Unable Help needed moving to and from a bed to chair (including a wheelchair)?: Total Help needed walking in hospital room?: Total Help needed climbing 3-5 steps with a  railing? : Total 6 Click Score: 6    End of Session Equipment Utilized During Treatment: Other (comment)(maximove) Activity Tolerance: Patient tolerated treatment well Patient left: with call bell/phone within reach;in chair Nurse Communication: Mobility status;Need for lift equipment PT Visit Diagnosis: Hemiplegia and hemiparesis;Other abnormalities of gait and mobility (R26.89);Other symptoms and signs involving the nervous system (R29.898) Hemiplegia - Right/Left: Left Hemiplegia - dominant/non-dominant: Non-dominant Hemiplegia - caused by: Nontraumatic SAH     Time: 1550-1630 PT Time Calculation (min) (ACUTE ONLY): 40 min  Charges:  $Therapeutic Exercise: 8-22 mins $Therapeutic Activity: 8-22 mins                    G CodesSheran Lawless:       Cyndi Wynn, [place]PT [phone] 02/27/2018    Elray Mcgregorynthia Wynn 02/27/2018, 4:53 PM

## 2018-02-27 NOTE — Progress Notes (Addendum)
STROKE TEAM PROGRESS NOTE   SUBJECTIVE (INTERVAL HISTORY) Currently undergoing HD. Complains of neck pain; pillow readjusted. Says it is going well. K remains down. BP elevated during HD.     CBC:  Recent Labs  Lab 02/26/18 0752 02/27/18 0632  WBC 8.3 8.7  HGB 8.7* 8.7*  HCT 29.5* 28.9*  MCV 84.5 83.8  PLT 190 192    Basic Metabolic Panel:  Recent Labs  Lab 02/24/18 2050  02/26/18 0752 02/27/18 0638  NA 151*   < > 152* 152*  K 5.9*   < > 4.6 4.3  CL 123*   < > 122* 124*  CO2 20*   < > 21* 21*  GLUCOSE 216*   < > 136* 130*  BUN 51*   < > 45* 44*  CREATININE 5.15*   < > 4.80* 5.01*  CALCIUM 8.7*   < > 8.8* 8.7*  PHOS 5.9*  --   --  3.4   < > = values in this interval not displayed.    IMAGING  No results found.   Physical exam: Vitals:   02/26/18 2348 02/27/18 0356 02/27/18 0540 02/27/18 0619  BP: (!) 179/74 (!) 194/78 (!) 191/94   Pulse: 77 80    Resp: (!) 22 20    Temp: 98.4 F (36.9 C) 99.8 F (37.7 C)    TempSrc: Axillary Oral    SpO2: 100% 95%    Weight:    (!) 170.4 kg (375 lb 9.6 oz)  Height:        Exam: Gen: NAD, well nourished, morbidly obese, warm to touch CV: RRR, no MRG. No Carotid Bruits. No peripheral edema, warm, nontender Eyes: Conjunctivae clear without exudates or hemorrhage Extremities: No toes R foot (amputation). L FA w/ graph site CDI w/ +bruit/thrill  Neuro: Speech:    Speech is dysarthric with normal comprehension.  Cognition:    The patient is oriented to person, month and year Cranial Nerves:    The pupils are equal, round, and reactive to light.  Visual fields are full to finger confrontation. Right gaze preference but can cross the midline. Left lower face weakness. Left lower qudrantanopia.  Hearing intact. Voice is normal. Shoulder shrug is normal.   Coordination:    No dysmetria  Motor Observation:  no involuntary movements noted. Tone:    Decreased muscle tone left.    Strength and sensation    Dense left  hemiparesis and hemisensory loss, withdraws minimally on left, leg > arm.      Reflex Exam: Toes: Left upgoing    No meaningful change in exam since yesterday   ASSESSMENT/[name] [name] [name] is a 52 y.o. male with history of hypertension, chronic kidney disease, diabetes, and right transmetatarsal amputation presenting with headache, nausea, and elevated blood pressure. He did not receive IV t-PA due to ICH.  Right BG ICH secondary to uncontrolled hypertension in the setting of CKD stage V  Resultant  Right gaze preference, L lower quadrant field cut, L hemiplegia, dysphagia  CT head 02/16/18  Acute right basal ganglia hemorrhage with estimated blood volume of 28 mL.  CT head 02/17/18 R BG IPH w/ 7mm R to L shift. Evolving L frontotemporal scalp contusion. Small vessel disease. Atrophy. 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  2D Echo - Decreased EF 45-50%. Diffuse hypokinesis.  LDL - 108   HgbA1c - 6.4  VTE prophylaxis - Heparin 5000 units sq tid added, risk outweighs  benefit Fall precautions DIET - DYS 1 Room service appropriate? Yes; Fluid consistency: Nectar Thick. ST following  No antithrombotic prior to admission, now on No antithrombotic d/t hmg  Ongoing aggressive stroke risk factor management  Therapy recommendations:  SNF, w/c w/ cushion  Disposition: SNF pending medical stability from renal standpoint  Induced hypernatremia for Cerebral Edema  Treated with hypertonic saline, now off  Sodium 150->152  Continue to monitor  Hypertensive Emergency  BP on arrival 251/158. Initially treated with cardene, then changed to cleviprex. Drip now off.  SBP goal < 180  PTA meds: norvasc 10, lasix 40, labetalol 200 bid  Now on:  norvasc 10, clonidine 0.3 tid, labetolol 300 tid, hydralazine 100 q 8 hours (increased yesterday)  BP  Remains elevated Received labetatol x 1 at 0400 1st HD 02/25/2018. Hope HD will improve  overall BP Monitor BP Long-term BP goal normotensive  CKD stage V  Renal US - No hydronephrosis. Left lower pole 9.8 mm nonobstructing stone.  Elevated creatinine. Treated with bicarb supplement, now off 44/5.01  VVS placed permacath and L FA shunt 02/24/2018  Fist dialysis 02/25/2017, at HD 02/27/2018  Needs CLIP   Nephrology following  Hyperlipidemia  Lipid lowering medication PTA:  none  LDL 108  Consider statin at discharge   Type II Diabetes  HgbA1c 6.4, at goal < 7.0  CBGs 117-160  Controlled  SSI  Other Stroke Risk Factors  Morbid Obesity, Body mass index is 48.2 kg/m.  Hx Stroke:  Previous right PCA infarct by imaging.  Hx diastolic dysfuction. No CHF. EF 45 - 50%  Other Active Problems  Anemia 9.0->9.3->8.7 - due to CKD. Replacing Fe IV  Hyperkalemia, resolved 5.9->5.7->4.3, treated with [facility] day # 11  [name] Main, NP [facility] See Amion for Pager information 02/27/2018 7:58 AM    ATTENDING NOTE: I reviewed above note and agree with the assessment and [name]. I have made any additions or clarifications directly to the above note. Pt was seen and examined in HD unit.  Pt neuro stable, no acute event overnight. BP still high, continue current BP meds (increased hydralazine to 100mg  tid yesterday). Hopefully today's HD can help BP management. Nephrology on board for BP management, [name] for HD also tomorrow. Pending placement this time.   I called wife [name] [name] today and spoke with her over the phone. Updated pt condition and answered all her questions. She mentioned that pt has FLMA and short term disability form need to be filled out. I asked her to bring to hospital. She expressed appreciation.  [name] Plan, MD PhD Stroke Neurology 02/27/2018 12:57 PM   To contact Stroke Continuity provider, please refer to [email or web address]. After hours, contact General Neurology

## 2018-02-27 NOTE — Progress Notes (Signed)
Pt back to room from dialysis. Dionne BucyP. Amo Lisa Blakeman RN

## 2018-02-28 LAB — RENAL FUNCTION PANEL
Albumin: 2.3 g/dL — ABNORMAL LOW (ref 3.5–5.0)
Anion gap: 9 (ref 5–15)
BUN: 16 mg/dL (ref 6–20)
CHLORIDE: 104 mmol/L (ref 101–111)
CO2: 26 mmol/L (ref 22–32)
Calcium: 8.3 mg/dL — ABNORMAL LOW (ref 8.9–10.3)
Creatinine, Ser: 2.52 mg/dL — ABNORMAL HIGH (ref 0.61–1.24)
GFR calc Af Amer: 33 mL/min — ABNORMAL LOW (ref >60)
GFR, EST NON AFRICAN AMERICAN: 28 mL/min — AB (ref >60)
GLUCOSE: 123 mg/dL — AB (ref 65–99)
POTASSIUM: 3.9 mmol/L (ref 3.5–5.1)
Phosphorus: 3.2 mg/dL (ref 2.5–4.6)
Sodium: 139 mmol/L (ref 135–145)

## 2018-02-28 LAB — CBC
HCT: 31.1 % — ABNORMAL LOW (ref 39.0–52.0)
HEMATOCRIT: 29.5 % — AB (ref 39.0–52.0)
HEMOGLOBIN: 9.7 g/dL — AB (ref 13.0–17.0)
Hemoglobin: 8.8 g/dL — ABNORMAL LOW (ref 13.0–17.0)
MCH: 24.9 pg — ABNORMAL LOW (ref 26.0–34.0)
MCH: 25.8 pg — ABNORMAL LOW (ref 26.0–34.0)
MCHC: 29.8 g/dL — AB (ref 30.0–36.0)
MCHC: 31.2 g/dL (ref 30.0–36.0)
MCV: 83.3 fL (ref 78.0–100.0)
MCV: 82.7 fL (ref 78.0–100.0)
Platelets: 170 10*3/uL (ref 150–400)
Platelets: 187 10*3/uL (ref 150–400)
RBC: 3.54 MIL/uL — ABNORMAL LOW (ref 4.22–5.81)
RBC: 3.76 MIL/uL — ABNORMAL LOW (ref 4.22–5.81)
RDW: 15.8 % — ABNORMAL HIGH (ref 11.5–15.5)
RDW: 15.9 % — AB (ref 11.5–15.5)
WBC: 9.6 10*3/uL (ref 4.0–10.5)
WBC: 9.9 10*3/uL (ref 4.0–10.5)

## 2018-02-28 LAB — BASIC METABOLIC PANEL
Anion gap: 9 (ref 5–15)
BUN: 28 mg/dL — AB (ref 6–20)
CO2: 24 mmol/L (ref 22–32)
CREATININE: 3.78 mg/dL — AB (ref 0.61–1.24)
Calcium: 8.5 mg/dL — ABNORMAL LOW (ref 8.9–10.3)
Chloride: 111 mmol/L (ref 101–111)
GFR calc Af Amer: 20 mL/min — ABNORMAL LOW (ref >60)
GFR calc non Af Amer: 17 mL/min — ABNORMAL LOW (ref >60)
GLUCOSE: 143 mg/dL — AB (ref 65–99)
Potassium: 4.1 mmol/L (ref 3.5–5.1)
Sodium: 144 mmol/L (ref 135–145)

## 2018-02-28 LAB — GLUCOSE, CAPILLARY
GLUCOSE-CAPILLARY: 114 mg/dL — AB (ref 65–99)
Glucose-Capillary: 123 mg/dL — ABNORMAL HIGH (ref 65–99)
Glucose-Capillary: 142 mg/dL — ABNORMAL HIGH (ref 65–99)
Glucose-Capillary: 153 mg/dL — ABNORMAL HIGH (ref 65–99)
Glucose-Capillary: 134 mg/dL — ABNORMAL HIGH (ref 65–99)

## 2018-02-28 LAB — PHOSPHORUS: Phosphorus: 2.9 mg/dL (ref 2.5–4.6)

## 2018-02-28 NOTE — Care Management Note (Signed)
Case Management Note  Patient Details  Name: [name] [name] MRN: [id number] Date of Birth: February 29, 1966  Subjective/Objective:                    Action/Plan: Plan is for SNF. Pt has outpatient dialysis center arranged to start on Tuesday. Pt needs to be able to tolerate 4 hours in the chair for dialysis. CM spoke to bedside RN about patient being up in the chair 4 hours each day starting today and over the weekend.  CM following.   Expected Discharge Date:                  Expected Discharge Plan:  Skilled Nursing Facility  In-House Referral:     Discharge planning Services  CM Consult  Post Acute Care Choice:    Choice offered to:     DME Arranged:    DME Agency:     HH Arranged:    HH Agency:     Status of Service:  In process, will continue to follow  If discussed at Long Length of Stay Meetings, dates discussed:    Additional Comments:  Kermit BaloKelli F Antonae Zbikowski, RN 02/28/2018, 12:55 PM

## 2018-02-28 NOTE — Progress Notes (Signed)
[facility] [address] .Terance Ice1st Treatment Tuesday April 02,2019 at 11:15am .Schedule Tuesday,Thursday,Saturday chair time 11:45am

## 2018-02-28 NOTE — Progress Notes (Addendum)
STROKE TEAM PROGRESS NOTE   SUBJECTIVE (INTERVAL HISTORY) Patient w/o complaints. Comfortable. Going to HD again today. FLMA and short term disability forms not in room. RN does not have.     CBC:  Recent Labs  Lab 02/27/18 0632 02/28/18 0746  WBC 8.7 9.6  HGB 8.7* 8.8*  HCT 28.9* 29.5*  MCV 83.8 83.3  PLT 192 170    Basic Metabolic Panel:  Recent Labs  Lab 02/24/18 2050  02/27/18 0638 02/28/18 0746  NA 151*   < > 152* 144  K 5.9*   < > 4.3 4.1  CL 123*   < > 124* 111  CO2 20*   < > 21* 24  GLUCOSE 216*   < > 130* 143*  BUN 51*   < > 44* 28*  CREATININE 5.15*   < > 5.01* 3.78*  CALCIUM 8.7*   < > 8.7* 8.5*  PHOS 5.9*  --  3.4  --    < > = values in this interval not displayed.    IMAGING  No results found.   Physical exam: Vitals:   02/27/18 2011 02/28/18 0022 02/28/18 0420 02/28/18 0749  BP: (!) 172/81 (!) 171/81 (!) 174/84 (!) 178/83  Pulse: 70 73 69 71  Resp: 20 20 20 20   Temp: 99.4 F (37.4 C) 97.9 F (36.6 C) 98.6 F (37 C) 98.6 F (37 C)  TempSrc: Oral Oral Oral Axillary  SpO2: 94% 93% 99% 99%  Weight:   (!) 164.2 kg (362 lb 1.6 oz)   Height:        Exam: Gen: NAD, well nourished, morbidly obese, warm to touch CV: RRR, no MRG. No Carotid Bruits. No peripheral edema, warm, nontender Eyes: Conjunctivae clear without exudates or hemorrhage Extremities: No toes R foot (amputation). L FA w/ graph site CDI w/ +bruit/thrill  Neuro: Speech:    Speech is dysarthric with normal comprehension.  Cognition:    The patient is oriented to person, month and year Cranial Nerves:    The pupils are equal, round, and reactive to light.  Visual fields are full to finger confrontation. Right gaze preference but can cross the midline. Left lower face weakness. Left lower qudrantanopia.  Hearing intact. Voice is normal. Shoulder shrug is normal.   Coordination:    No dysmetria  Motor Observation:  no involuntary movements noted. Tone:    Decreased muscle  tone left.    Strength and sensation    Dense left hemiparesis and hemisensory loss, withdraws minimally on left, leg > arm.      Reflex Exam: Toes: Left upgoing    No change in exam  ASSESSMENT/PLAN [name] with history of hypertension, chronic kidney disease, diabetes, and right transmetatarsal amputation presenting with headache, nausea, and elevated blood pressure. He did not receive IV t-PA due to ICH.  Right BG ICH secondary to uncontrolled hypertension in the setting of CKD stage V  Resultant  Right gaze preference, L lower quadrant field cut, L hemiplegia, dysphagia  CT head 02/16/18  Acute right basal ganglia hemorrhage with estimated blood volume of 28 mL.  CT head 02/17/18 R BG IPH w/ 7mm R to L shift. Evolving L frontotemporal scalp contusion. Small vessel disease. Atrophy. 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  2D Echo - Decreased EF 45-50%. Diffuse hypokinesis.  LDL - 108   HgbA1c - 6.4  VTE prophylaxis - Heparin 5000 units sq tid  added, risk outweighs benefit Fall precautions DIET - DYS 1 Room service appropriate? Yes; Fluid consistency: Nectar Thick. ST following  No antithrombotic prior to admission, now on No antithrombotic d/t hmg  Ongoing aggressive stroke risk factor management  Therapy recommendations:  SNF, w/c w/ cushion  Disposition: SNF pending medical stability from renal standpoint  Induced hypernatremia for Cerebral Edema  Treated with hypertonic saline, now off  Sodium 150->144  Continue to monitor  Hypertensive Emergency  BP on arrival 251/158. Initially treated with cardene, then changed to cleviprex. Drip now off.  SBP goal < 180  PTA meds: norvasc 10, lasix 40, labetalol 200 bid  Now on:  norvasc 10, clonidine 0.3 tid, labetolol 300 tid, hydralazine 100 q 8 hours (increased yesterday)  1st HD 02/25/2018. Hope HD will improve overall BP  BP improved, < 180  since late yesterday  Continue to Monitor BP  Long-term BP goal normotensive  CKD stage V  Renal US - No hydronephrosis. Left lower pole 9.8 mm nonobstructing stone.  Elevated creatinine. Treated with bicarb supplement, now off Cr 3.78  VVS placed permacath and L FA shunt 02/24/2018  Fist dialysis 02/25/2017, at HD 02/27/2018. Plans HD again later todsay  Needs CLIP   Nephrology following  Hyperlipidemia  Lipid lowering medication PTA:  none  LDL 108  Consider statin at discharge   Type II Diabetes  HgbA1c 6.4, at goal < 7.0  CBGs 123-153  Controlled  SSI  Other Stroke Risk Factors  Morbid Obesity, Body mass index is 46.47 kg/m.  Hx Stroke:  Previous right PCA infarct by imaging.  Hx diastolic dysfuction. No CHF. EF 45 - 50%  Other Active Problems  Anemia 9.0->8.8 - due to CKD. Replacing Fe IV  Hyperkalemia, resolved   Hospital day # 12  Annie Main, NP [facility] See Amion for Pager information 02/28/2018 1:56 PM   ATTENDING NOTE: I reviewed above note and agree with the assessment and plan. I have made any additions or clarifications directly to the above note. Pt was seen and examined.  Pt neuro stable, no acute event overnight. Lab stable, no hyperkalemia or worsening anemia. Cre improved. Had HD today again. BP much improved and now at 150-160s after two consecutive HD. still pending placement. Pt has to be able to sit more than 4 hours before transfer to SNF.    Marvel Plan, MD PhD Stroke Neurology 02/27/2018 12:57 PM    To contact Stroke Continuity provider, please refer to [email or web address]. After hours, contact General Neurology

## 2018-02-28 NOTE — Progress Notes (Signed)
[facility] Progress Note   52 year old with uncontrolled hypertension and diabetes and progressive renal failure   He presented with acute basal ganglion hemorrhage and was found to have progressed to stage 5 chronic renal disease and will need dialysis  Appreciate assistance fromDr Early AVF Left and Dialysis catheter  He has a Right field cut and left hemiplegia As a result of the bleed  He has a decreased EF 45 % Diffuse hypokinesia    Assessment/ Plan:    New ESRD Appreciate assistance of DrEarlyWill also need CLIP  Seen on HD today No complaints 3K/2.25Ca  - Awaiting CLIP (will be TTS) -> plan on HD for UF tomorrow and then switch to TTS given that will be the outpt regimen  Seen on HD RIJ TC QB300 2k/2.5Ca   HTN will follow will remove fluid on dialysis  Metabolic acidosis should correct with dialysis  Bones Phos continuephoslo 2 with meals (phos 3/27 3.4)PTH 60   Anemia iron sats Low will give IV iron  Hemorrhagic CVA with hemiplegia    Subjective:   Denies any f/c/n/v/dyspnea.   Objective:   BP (!) 190/96 (BP Location: Right Arm)   Pulse 75   Temp 97.6 F (36.4 C) (Oral)   Resp 20   Ht 6' 2.02" (1.88 m)   Wt (!) 164.2 kg (362 lb 1.6 oz) Comment: Bed weight is working now  SpO2 93%   BMI 46.47 kg/m   Intake/Output Summary (Last 24 hours) at 02/28/2018 2157 Last data filed at 02/28/2018 1708 Gross per 24 hour  Intake 120 ml  Output 4405 ml  Net -4285 ml   Weight change: -6.169 kg (-13 lb 9.6 oz)  Physical Exam: GEN - NAD, pleasant CVS- RRR RS- CTA ABD- BS present soft non-distended EXT- Anasarca Lower extremities, rt TMA, lt cimino good bruit, left arm swollen    Imaging: No results found.  Labs: BMET Recent Labs  Lab 02/24/18 2050 02/25/18 0321 02/25/18 [id number] 02/26/18 0752 02/27/18 [id number] 02/28/18 0746 02/28/18 1821 02/28/18 2024  NA 151* 152* 151* 152* 152* 144  --  139  K  5.9* 5.7* 5.3* 4.6 4.3 4.1  --  3.9  CL 123* 121* 121* 122* 124* 111  --  104  CO2 20* 21* 23 21* 21* 24  --  26  GLUCOSE 216* 181* 157* 136* 130* 143*  --  123*  BUN 51* 53* 47* 45* 44* 28*  --  16  CREATININE 5.15* 4.96* 4.55* 4.80* 5.01* 3.78*  --  2.52*  CALCIUM 8.7* 8.5* 8.6* 8.8* 8.7* 8.5*  --  8.3*  PHOS 5.9*  --   --   --  3.4  --  2.9 3.2   CBC Recent Labs  Lab 02/26/18 0752 02/27/18 0632 02/28/18 0746 02/28/18 2024  WBC 8.3 8.7 9.6 9.9  HGB 8.7* 8.7* 8.8* 9.7*  HCT 29.5* 28.9* 29.5* 31.1*  MCV 84.5 83.8 83.3 82.7  PLT 190 192 170 187    Medications:    . amLODipine  10 mg Per Tube Daily  . calcium acetate  1,334 mg Oral TID WC  . chlorhexidine  15 mL Mouth Rinse BID  . Chlorhexidine Gluconate Cloth  6 each Topical Q0600  . cloNIDine  0.3 mg Oral TID  . heparin injection (subcutaneous)  5,000 Units Subcutaneous Q8H  . hydrALAZINE  100 mg Oral Q8H  . insulin aspart  0-15 Units Subcutaneous Q4H  . labetalol  300 mg Oral TID  .  mouth rinse  15 mL Mouth Rinse q12n4p  . pantoprazole  40 mg Oral QHS  . senna-docusate  1 tablet Oral BID  . sodium chloride flush  10-40 mL Intracatheter Q12H      Paulene Floor, MD 02/28/2018, 9:57 PM

## 2018-02-28 NOTE — Progress Notes (Signed)
  Speech Language Pathology Treatment: Dysphagia;Cognitive-Linquistic  Patient Details Name: [name] [name] MRN: [id number] DOB: 1966-08-02 Today's Date: 02/28/2018 Time: [id number] SLP Time Calculation (min) (ACUTE ONLY): 17 min  Assessment / Plan / Recommendation Clinical Impression  Pt consumed sips of water with Min cues for smaller boluses but using a chin tuck with Mod I. No overt s/s of aspiration were observed. He then ate minimal amounts of his meal tray, still having prolonged bolus formation/transit, left buccal pocketing, and left anterior loss. SLP provided Mod cues for emergent awareness as well as management/clearance. Mod cues were also provided for basic, functional problem solving and for pt to find objects on the left side of his meal tray. Would continue with current plan of care.   HPI HPI: Pt is a 52 y.o.malewith history of hypertension, chronic kidney disease, diabetes, and right transmetatarsal amputationpresenting with headache, nausea, and elevated blood pressure, found to haveacute ICH located in the right basal ganglia with midline shift and surrounding edema.       SLP Plan  Continue with current plan of care       Recommendations  Diet recommendations: Dysphagia 1 (puree);Nectar-thick liquid;Other(comment)(sips of water in between meals after oral care) Liquids provided via: Cup;No straw;Teaspoon Medication Administration: Crushed with puree Supervision: Patient able to self feed;Full supervision/cueing for compensatory strategies Compensations: Minimize environmental distractions;Slow rate;Small sips/bites;Monitor for anterior loss;Lingual sweep for clearance of pocketing;Other (Comment)(chin tuck with water) Postural Changes and/or Swallow Maneuvers: Seated upright 90 degrees                Oral Care Recommendations: Oral care BID Follow up Recommendations: Skilled Nursing facility SLP Visit Diagnosis: Dysphagia, oropharyngeal phase  (R13.12) Plan: Continue with current plan of care       GO                [name] [name], [name] [name] 02/28/2018, 12:22 PM  [name] [name], M.A. CCC-SLP [phone]

## 2018-03-01 LAB — RENAL FUNCTION PANEL
ANION GAP: 10 (ref 5–15)
Albumin: 2.2 g/dL — ABNORMAL LOW (ref 3.5–5.0)
BUN: 18 mg/dL (ref 6–20)
CHLORIDE: 105 mmol/L (ref 101–111)
CO2: 25 mmol/L (ref 22–32)
Calcium: 8.4 mg/dL — ABNORMAL LOW (ref 8.9–10.3)
Creatinine, Ser: 2.77 mg/dL — ABNORMAL HIGH (ref 0.61–1.24)
GFR calc Af Amer: 29 mL/min — ABNORMAL LOW (ref >60)
GFR calc non Af Amer: 25 mL/min — ABNORMAL LOW (ref >60)
GLUCOSE: 121 mg/dL — AB (ref 65–99)
PHOSPHORUS: 3.3 mg/dL (ref 2.5–4.6)
POTASSIUM: 3.8 mmol/L (ref 3.5–5.1)
Sodium: 140 mmol/L (ref 135–145)

## 2018-03-01 LAB — CBC
HCT: 31.4 % — ABNORMAL LOW (ref 39.0–52.0)
HEMATOCRIT: 30.4 % — AB (ref 39.0–52.0)
HEMOGLOBIN: 9.7 g/dL — AB (ref 13.0–17.0)
HEMOGLOBIN: 9.7 g/dL — AB (ref 13.0–17.0)
MCH: 26.2 pg (ref 26.0–34.0)
MCH: 25.6 pg — ABNORMAL LOW (ref 26.0–34.0)
MCHC: 31.9 g/dL (ref 30.0–36.0)
MCHC: 30.9 g/dL (ref 30.0–36.0)
MCV: 82.2 fL (ref 78.0–100.0)
MCV: 82.8 fL (ref 78.0–100.0)
PLATELETS: 195 10*3/uL (ref 150–400)
Platelets: 180 10*3/uL (ref 150–400)
RBC: 3.7 MIL/uL — ABNORMAL LOW (ref 4.22–5.81)
RBC: 3.79 MIL/uL — AB (ref 4.22–5.81)
RDW: 15.8 % — AB (ref 11.5–15.5)
RDW: 15.5 % (ref 11.5–15.5)
WBC: 10 10*3/uL (ref 4.0–10.5)
WBC: 9.9 10*3/uL (ref 4.0–10.5)

## 2018-03-01 LAB — GLUCOSE, CAPILLARY
GLUCOSE-CAPILLARY: 105 mg/dL — AB (ref 65–99)
Glucose-Capillary: 121 mg/dL — ABNORMAL HIGH (ref 65–99)
Glucose-Capillary: 114 mg/dL — ABNORMAL HIGH (ref 65–99)
Glucose-Capillary: 110 mg/dL — ABNORMAL HIGH (ref 65–99)

## 2018-03-01 LAB — BASIC METABOLIC PANEL
ANION GAP: 10 (ref 5–15)
BUN: 19 mg/dL (ref 6–20)
CHLORIDE: 105 mmol/L (ref 101–111)
CO2: 25 mmol/L (ref 22–32)
Calcium: 8.5 mg/dL — ABNORMAL LOW (ref 8.9–10.3)
Creatinine, Ser: 2.94 mg/dL — ABNORMAL HIGH (ref 0.61–1.24)
GFR calc Af Amer: 27 mL/min — ABNORMAL LOW (ref >60)
GFR calc non Af Amer: 23 mL/min — ABNORMAL LOW (ref >60)
Glucose, Bld: 109 mg/dL — ABNORMAL HIGH (ref 65–99)
POTASSIUM: 3.9 mmol/L (ref 3.5–5.1)
Sodium: 140 mmol/L (ref 135–145)

## 2018-03-01 MED ORDER — CLONIDINE HCL 0.1 MG PO TABS
0.3000 mg | ORAL_TABLET | Freq: Three times a day (TID) | ORAL | Status: DC
  Administered 2018-03-01 – 2018-03-06 (×14): 0.3 mg via ORAL
  Filled 2018-03-01 (×14): qty 3

## 2018-03-01 NOTE — Progress Notes (Signed)
[facility] Progress Note   52 year old with uncontrolled hypertension and diabetes and progressive renal failure   He presented with acute basal ganglion hemorrhage and was found to have progressed to stage 5 chronic renal disease and will need dialysis  Appreciate assistance fromDr Early AVF Left and Dialysis catheter  He has a Right field cut and left hemiplegia As a result of the bleed  He has a decreased EF 45 % Diffuse hypokinesia   Assessment/ Plan:    New ESRD Appreciate assistance of DrEarlyWill also need CLIP  Seen on HD today No complaints 4K/2.25Ca bath 187/100  Qb/qd 300/600 Goal UF 3.5L (3L net)  Tolerating so far.  - Awaiting CLIP (will be TTS) -> plan on HD for UF tomorrow and then switch to TTS given that will be the outpt regimen   HTN will follow will remove fluid on dialysis  Metabolic acidosis should correct with dialysis  Bones Phos continuephoslo 2 with meals (phos 3/27 3.4)PTH 60   Anemia iron sats Low will give IV iron  Hemorrhagic CVA with hemiplegia    Subjective:   Denies any f/c/n/v/dyspnea. Mild HA; no visual disturbances   Objective:   BP (!) 185/105   Pulse 77   Temp 98.4 F (36.9 C) (Oral)   Resp 12   Ht 6' 2.02" (1.88 m)   Wt (!) 164.2 kg (362 lb 1.6 oz) Comment: Bed weight is working now  SpO2 99%   BMI 46.47 kg/m   Intake/Output Summary (Last 24 hours) at 03/01/2018 1211 Last data filed at 03/01/2018 0600 Gross per 24 hour  Intake 180 ml  Output 4855 ml  Net -4675 ml   Weight change:   Physical Exam: GEN - NAD, pleasant CVS- RRR RS- CTA ABD- BS present soft non-distended EXT- Anasarca Lower extremities, rt TMA, lt cimino good bruit, left arm swollen      Imaging: No results found.  Labs: BMET Recent Labs  Lab 02/24/18 2050  02/25/18 1610 02/26/18 9604 02/27/18 5409 02/28/18 0746 02/28/18 1821 02/28/18 2024 03/01/18 0117 03/01/18 0558  NA 151*    < > 151* 152* 152* 144  --  139 140 140  K 5.9*   < > 5.3* 4.6 4.3 4.1  --  3.9 3.8 3.9  CL 123*   < > 121* 122* 124* 111  --  104 105 105  CO2 20*   < > 23 21* 21* 24  --  26 25 25   GLUCOSE 216*   < > 157* 136* 130* 143*  --  123* 121* 109*  BUN 51*   < > 47* 45* 44* 28*  --  16 18 19   CREATININE 5.15*   < > 4.55* 4.80* 5.01* 3.78*  --  2.52* 2.77* 2.94*  CALCIUM 8.7*   < > 8.6* 8.8* 8.7* 8.5*  --  8.3* 8.4* 8.5*  PHOS 5.9*  --   --   --  3.4  --  2.9 3.2 3.3  --    < > = values in this interval not displayed.   CBC Recent Labs  Lab 02/28/18 0746 02/28/18 2024 03/01/18 0117 03/01/18 0558  WBC 9.6 9.9 10.0 9.9  HGB 8.8* 9.7* 9.7* 9.7*  HCT 29.5* 31.1* 30.4* 31.4*  MCV 83.3 82.7 82.2 82.8  PLT 170 187 180 195    Medications:    . amLODipine  10 mg Per Tube Daily  . calcium acetate  1,334 mg Oral TID WC  . chlorhexidine  15 mL Mouth Rinse BID  . Chlorhexidine Gluconate Cloth  6 each Topical Q0600  . cloNIDine  0.3 mg Oral TID  . heparin injection (subcutaneous)  5,000 Units Subcutaneous Q8H  . hydrALAZINE  100 mg Oral Q8H  . insulin aspart  0-15 Units Subcutaneous Q4H  . labetalol  300 mg Oral TID  . mouth rinse  15 mL Mouth Rinse q12n4p  . pantoprazole  40 mg Oral QHS  . senna-docusate  1 tablet Oral BID  . sodium chloride flush  10-40 mL Intracatheter Q12H      Paulene FloorJames Jaymison Luber, MD 03/01/2018, 12:11 PM

## 2018-03-01 NOTE — Progress Notes (Signed)
Patient assisted into chair using maxi-move, pat tolerated well

## 2018-03-01 NOTE — Progress Notes (Signed)
Spoke with Martie LeeSabrina in pharmacy at 1610 about give the Labetalol so close after he received a late dose in Dialysis. Pharmacy gave the okay due his BP being in the 180's

## 2018-03-01 NOTE — Progress Notes (Signed)
STROKE TEAM PROGRESS NOTE   History Per H&P [name] [name] is an 52 y.o. male  AA RH with PMH of uncontrolled HTN, CKD, insulin-dependent T2DM presented to [facility] ER .  Pt reports around 6PM last night, he was sitting ina desk chair and dropped his phone and slipped to the floor. His family tried to get him up but he refused saying he was fine and would get up. He was still on the floor today and so they called EMS.Now he endorses mild headacheand mild nausea. BP was >200 systolic on arrival. He was started on Cerdene gtt at [facility] and later switched to Kleveprix drip - but still not under control on arrival [facility].    Date last known well: 3.16.19 Time last known well: 6pm tPA Given: no, ICH NIHSS: 17 Baseline MRS 0    SUBJECTIVE (INTERVAL HISTORY) Patient w/o complaints. Comfortable. Going to HD again today. No family at bedside. Stable overnight..     CBC:  Recent Labs  Lab 03/01/18 0117 03/01/18 0558  WBC 10.0 9.9  HGB 9.7* 9.7*  HCT 30.4* 31.4*  MCV 82.2 82.8  PLT 180 195    Basic Metabolic Panel:  Recent Labs  Lab 02/28/18 2024 03/01/18 0117 03/01/18 0558  NA 139 140 140  K 3.9 3.8 3.9  CL 104 105 105  CO2 26 25 25   GLUCOSE 123* 121* 109*  BUN 16 18 19   CREATININE 2.52* 2.77* 2.94*  CALCIUM 8.3* 8.4* 8.5*  PHOS 3.2 3.3  --     IMAGING  No results found.   Physical exam: Vitals:   02/28/18 1708 02/28/18 2038 02/28/18 2350 03/01/18 0405  BP: (!) 161/83 (!) 190/96 (!) 169/89 (!) 185/88  Pulse: 72 75 71 74  Resp: (!) 22 20 18 18   Temp: 98 F (36.7 C) 97.6 F (36.4 C) 98 F (36.7 C) 97.7 F (36.5 C)  TempSrc: Oral Oral Oral Axillary  SpO2: 98% 93% 93% 94%  Weight:      Height:        Exam: Gen: NAD, well nourished, morbidly obese, warm to touch CV: RRR, no MRG. No Carotid Bruits. No peripheral edema, warm, nontender Eyes: Conjunctivae clear without exudates or hemorrhage Extremities: No toes R foot (amputation). L FA  w/ graph site CDI w/ +bruit/thrill  Neuro: Speech:    Speech is dysarthric with normal comprehension.  Cognition:    The patient is oriented to person, month and year Cranial Nerves:    The pupils are equal, round, and reactive to light.  Visual fields are full to finger confrontation. Right gaze preference but can cross the midline. Left lower face weakness. Left lower qudrantanopia.  Hearing intact. Voice is normal. Shoulder shrug is normal.   Coordination:    No dysmetria  Motor Observation:  no involuntary movements noted. Tone:    Decreased muscle tone left.    Strength and sensation    Dense left hemiparesis and hemisensory loss, withdraws minimally on left, leg > arm.      Reflex Exam: Toes: Left upgoing    No change in exam  ASSESSMENT/PLAN Mr. [name] ChanceReginald F Kampe is a 52 y.o. male with history of hypertension, chronic kidney disease, diabetes, and right transmetatarsal amputation presenting with headache, nausea, and elevated blood pressure. He did not receive IV t-PA due to ICH.  Right BG ICH secondary to uncontrolled hypertension in the setting of CKD stage V  Resultant  Right gaze preference, L lower quadrant  field cut, L hemiplegia, dysphagia  CT head 02/16/18  Acute right basal ganglia hemorrhage with estimated blood volume of 28 mL.  CT head 02/17/18 R BG IPH w/ 7mm R to L shift. Evolving L frontotemporal scalp contusion. Small vessel disease. Atrophy. 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  2D Echo - Decreased EF 45-50%. Diffuse hypokinesis.  LDL - 108   HgbA1c - 6.4  VTE prophylaxis - Heparin 5000 units sq tid added, risk outweighs benefit Fall precautions DIET - DYS 1 Room service appropriate? Yes; Fluid consistency: Nectar Thick. ST following  No antithrombotic prior to admission, now on No antithrombotic d/t hmg  Ongoing aggressive stroke risk factor management  Therapy recommendations:  SNF, w/c w/  cushion  Disposition: SNF pending medical stability from renal standpoint  Induced hypernatremia for Cerebral Edema  Treated with hypertonic saline, now off  Sodium 150->144  Continue to monitor  Hypertensive Emergency  BP on arrival 251/158. Initially treated with cardene, then changed to cleviprex. Drip now off.  SBP goal < 180  PTA meds: norvasc 10, lasix 40, labetalol 200 bid  Now on:  norvasc 10, clonidine 0.3 tid, labetolol 300 tid, hydralazine 100 q 8 hours (increased yesterday)  1st HD 02/25/2018. Hope HD will improve overall BP  BP improved, < 180 since late yesterday  Continue to Monitor BP  Long-term BP goal normotensive  CKD stage V  Renal US - No hydronephrosis. Left lower pole 9.8 mm nonobstructing stone.  Elevated creatinine. Treated with bicarb supplement, now off Cr 3.78  VVS placed permacath and L FA shunt 02/24/2018  Fist dialysis 02/25/2017, at HD 02/27/2018. Plans HD again later todsay  Needs CLIP   Nephrology following  Hyperlipidemia  Lipid lowering medication PTA:  none  LDL 108  Consider statin at discharge   Type II Diabetes  HgbA1c 6.4, at goal < 7.0  CBGs 123-153  Controlled  SSI  Other Stroke Risk Factors  Morbid Obesity, Body mass index is 46.47 kg/m.  Hx Stroke:  Previous right PCA infarct by imaging.  Hx diastolic dysfuction. No CHF. EF 45 - 50%   Other Active Problems  Anemia 9.0->8.8->9.7 - due to CKD. Replacing Fe IV  Hyperkalemia, resolved   ESRD - dialysis  Hospital day # 13   Personally examined patient and images, and have participated in and made any corrections needed to history, physical, neuro exam,assessment and plan as stated above.  I have personally obtained the history, evaluated lab date, reviewed imaging studies and agree with radiology interpretations.    Naomie Dean, MD Stroke Neurology  To contact Stroke Continuity provider, please refer to [email or web address]. After hours, contact General  Neurology

## 2018-03-02 ENCOUNTER — Inpatient Hospital Stay (HOSPITAL_COMMUNITY): Payer: 59

## 2018-03-02 DIAGNOSIS — M79609 Pain in unspecified limb: Secondary | ICD-10-CM

## 2018-03-02 DIAGNOSIS — M7989 Other specified soft tissue disorders: Secondary | ICD-10-CM

## 2018-03-02 LAB — CBC
HEMATOCRIT: 33.3 % — AB (ref 39.0–52.0)
Hemoglobin: 10.3 g/dL — ABNORMAL LOW (ref 13.0–17.0)
MCH: 25.1 pg — ABNORMAL LOW (ref 26.0–34.0)
MCHC: 30.9 g/dL (ref 30.0–36.0)
MCV: 81.2 fL (ref 78.0–100.0)
Platelets: 196 10*3/uL (ref 150–400)
RBC: 4.1 MIL/uL — ABNORMAL LOW (ref 4.22–5.81)
RDW: 15.3 % (ref 11.5–15.5)
WBC: 10.5 10*3/uL (ref 4.0–10.5)

## 2018-03-02 LAB — BASIC METABOLIC PANEL
ANION GAP: 12 (ref 5–15)
BUN: 18 mg/dL (ref 6–20)
CALCIUM: 8.7 mg/dL — AB (ref 8.9–10.3)
CO2: 23 mmol/L (ref 22–32)
Chloride: 102 mmol/L (ref 101–111)
Creatinine, Ser: 2.97 mg/dL — ABNORMAL HIGH (ref 0.61–1.24)
GFR calc Af Amer: 27 mL/min — ABNORMAL LOW (ref >60)
GFR calc non Af Amer: 23 mL/min — ABNORMAL LOW (ref >60)
Glucose, Bld: 131 mg/dL — ABNORMAL HIGH (ref 65–99)
POTASSIUM: 3.9 mmol/L (ref 3.5–5.1)
Sodium: 137 mmol/L (ref 135–145)

## 2018-03-02 LAB — GLUCOSE, CAPILLARY
GLUCOSE-CAPILLARY: 118 mg/dL — AB (ref 65–99)
GLUCOSE-CAPILLARY: 125 mg/dL — AB (ref 65–99)
GLUCOSE-CAPILLARY: 135 mg/dL — AB (ref 65–99)
GLUCOSE-CAPILLARY: 136 mg/dL — AB (ref 65–99)
Glucose-Capillary: 113 mg/dL — ABNORMAL HIGH (ref 65–99)
Glucose-Capillary: 114 mg/dL — ABNORMAL HIGH (ref 65–99)
Glucose-Capillary: 119 mg/dL — ABNORMAL HIGH (ref 65–99)

## 2018-03-02 NOTE — Progress Notes (Signed)
Patient has the appearance of a knot on the (R) forearm, MD notifed.

## 2018-03-02 NOTE — Progress Notes (Signed)
STROKE TEAM PROGRESS NOTE   History Per H&P [name] [name] is an 52 y.o. male  AA RH with PMH of uncontrolled HTN, CKD, insulin-dependent T2DM presented to [facility] ER .  Pt reports around 6PM last night, he was sitting ina desk chair and dropped his phone and slipped to the floor. His family tried to get him up but he refused saying he was fine and would get up. He was still on the floor today and so they called EMS.Now he endorses mild headacheand mild nausea. BP was >200 systolic on arrival. He was started on Cerdene gtt at [place] and later switched to Kleveprix drip - but still not under control on arrival [facility].    Date last known well: 3.16.19 Time last known well: 6pm tPA Given: no, ICH NIHSS: 17 Baseline MRS 0    SUBJECTIVE (INTERVAL HISTORY) Patient has swelling in the right UE, non mobile cord, ordering ultrasound. Otherwise stable without any overnight events.    CBC:  Recent Labs  Lab 03/01/18 0558 03/02/18 0327  WBC 9.9 10.5  HGB 9.7* 10.3*  HCT 31.4* 33.3*  MCV 82.8 81.2  PLT 195 196    Basic Metabolic Panel:  Recent Labs  Lab 02/28/18 2024 03/01/18 0117 03/01/18 0558 03/02/18 0327  NA 139 140 140 137  K 3.9 3.8 3.9 3.9  CL 104 105 105 102  CO2 26 25 25 23   GLUCOSE 123* 121* 109* 131*  BUN 16 18 19 18   CREATININE 2.52* 2.77* 2.94* 2.97*  CALCIUM 8.3* 8.4* 8.5* 8.7*  PHOS 3.2 3.3  --   --     IMAGING  No results found.   Physical exam: Vitals:   03/01/18 2020 03/02/18 0029 03/02/18 0359 03/02/18 0851  BP: (!) 192/89 (!) 178/86 (!) 191/78 (!) 188/97  Pulse: 75 73 76 79  Resp: 18 18 18 19   Temp: 98.3 F (36.8 C) 98.3 F (36.8 C) 98.3 F (36.8 C) 98.6 F (37 C)  TempSrc: Oral Oral Oral Oral  SpO2: 96% 93% 95% 93%  Weight:      Height:        Exam: Gen: NAD, well nourished, morbidly obese, warm to touch CV: RRR, no MRG. No Carotid Bruits. No peripheral edema, warm, nontender Eyes: Conjunctivae clear without  exudates or hemorrhage Extremities: No toes R foot (amputation). L FA w/ graph site CDI w/ +bruit/thrill  Neuro: Speech:    Speech is dysarthric with normal comprehension.  Cognition:    The patient is oriented to person, month and year Cranial Nerves:    The pupils are equal, round, and reactive to light.  Visual fields are full to finger confrontation. EOMI. Left lower face weakness. Left lower qudrantanopia.  Hearing intact. Voice is normal. Shoulder shrug is normal.     Motor Observation:  no involuntary movements noted.   Strength and sensation    Dense left hemiparesis and hemisensory loss     Reflex Exam: Toes: Left upgoing    No change in exam  ASSESSMENT/PLAN [name] [name] is a 52 y.o. male with history of hypertension, chronic kidney disease, diabetes, and right transmetatarsal amputation presenting with headache, nausea, and elevated blood pressure. He did not receive IV t-PA due to ICH.  Right BG ICH secondary to uncontrolled hypertension in the setting of CKD stage V  Resultant  Right gaze preference, L lower quadrant field cut, L hemiplegia, dysphagia  CT head 02/16/18  Acute right basal ganglia hemorrhage  with estimated blood volume of 28 mL.  CT head 02/17/18 R BG IPH w/ 7mm R to L shift. Evolving L frontotemporal scalp contusion. Small vessel disease. Atrophy. 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  2D Echo - Decreased EF 45-50%. Diffuse hypokinesis.  LDL - 108   HgbA1c - 6.4  VTE prophylaxis - Heparin 5000 units sq tid added, risk outweighs benefit Fall precautions DIET - DYS 1 Room service appropriate? Yes; Fluid consistency: Nectar Thick. ST following  No antithrombotic prior to admission, now on No antithrombotic d/t hmg  Ongoing aggressive stroke risk factor management  Therapy recommendations:  SNF, w/c w/ cushion  Disposition: SNF pending medical stability from renal standpoint  Induced  hypernatremia for Cerebral Edema  Treated with hypertonic saline, now off  Sodium 150->144  Continue to monitor  Hypertensive Emergency  BP on arrival 251/158. Initially treated with cardene, then changed to cleviprex. Drip now off.  SBP goal < 180  PTA meds: norvasc 10, lasix 40, labetalol 200 bid  Now on:  norvasc 10, clonidine 0.3 tid, labetolol 300 tid, hydralazine 100 q 8 hours (increased yesterday)  1st HD 02/25/2018. Hope HD will improve overall BP  BP improved, < 180 since late yesterday  Continue to Monitor BP  Long-term BP goal normotensive  CKD stage V  Renal US - No hydronephrosis. Left lower pole 9.8 mm nonobstructing stone.  Elevated creatinine. Treated with bicarb supplement, now off Cr 3.78  VVS placed permacath and L FA shunt 02/24/2018  Fist dialysis 02/25/2017, at HD 02/27/2018. Plans HD again later today  Needs CLIP   Nephrology following  Hyperlipidemia  Lipid lowering medication PTA:  none  LDL 108  Consider statin at discharge   Type II Diabetes  HgbA1c 6.4, at goal < 7.0  CBGs 123-153  Controlled  SSI  Other Stroke Risk Factors  Morbid Obesity, Body mass index is 46.47 kg/m.  Hx Stroke:  Previous right PCA infarct by imaging.  Hx diastolic dysfuction. No CHF. EF 45 - 50%   Other Active Problems  Anemia 9.0->8.8->9.7->10.3 - due to CKD. Replacing Fe IV  Hyperkalemia, resolved   ESRD - dialysis  RUE edema -> venous US  Awaiting SNF placement - Case Manager following.  Hospital day # 14   Personally examined patient and images, and have participated in and made any corrections needed to history, physical, neuro exam,assessment and plan as stated above.  I have personally obtained the history, evaluated lab date, reviewed imaging studies and agree with radiology interpretations.   Pending placement.   To contact Stroke Continuity provider, please refer to [email or web address]. After hours, contact General Neurology

## 2018-03-02 NOTE — Progress Notes (Signed)
MD came to assess, vascular order and result pending. Patient resting comfortably, will respond to sound of voice, pain 0/10, and right arm continue to be elevated with ice pack until further assessment can be done.

## 2018-03-02 NOTE — Progress Notes (Signed)
VASCULAR LAB PRELIMINARY  PRELIMINARY  PRELIMINARY  PRELIMINARY  Right upper extremity venous duplex completed.    Preliminary report:  There is no DVT noted in the right upper extremity.  There is superficial thrombophlebitis noted in the right cephalic and basilic veins, throughout the forearm.   Emilynn Srinivasan, RVT 03/02/2018, 6:34 PM

## 2018-03-02 NOTE — Progress Notes (Signed)
[facility] Progress Note   52 year old with uncontrolled hypertension and diabetes and progressive renal failure   He presented with acute basal ganglion hemorrhage and was found to have progressed to stage 5 chronic renal disease and will need dialysis  Appreciate assistance fromDr Early AVF Left and Dialysis catheter  He has a Right field cut and left hemiplegia As a result of the bleed  He has a decreased EF 45 % Diffuse hypokinesia   Assessment/ Plan:    New ESRD Appreciate assistance of DrEarlyWill also need CLIP  - Last HD #4 Sat (3/30)   Awaiting CLIP (will be TTS @ [facility]) - Next HD Mon on MTTS regimen for now to help reach EDW especially in setting of HTN   HTN - see above.   Metabolic acidosis should correct with dialysis  Bones Phos continuephoslo 2 with meals (phos 3/27 3.4)PTH 60   Anemia iron sats Low will give IV iron  Hemorrhagic CVA with hemiplegia     Subjective:   Denies any f/c/n/v/dyspnea. Mild HA; no new visual disturbances  Tolerated HD yest for additional UF   Objective:   BP (!) 178/79 (BP Location: Right Arm)   Pulse 78   Temp 98.9 F (37.2 C) (Oral)   Resp 19   Ht 6' 2.02" (1.88 m)   Wt (!) 164.2 kg (362 lb 1.6 oz) Comment: Bed weight is working now  SpO2 95%   BMI 46.47 kg/m   Intake/Output Summary (Last 24 hours) at 03/02/2018 1350 Last data filed at 03/02/2018 0800 Gross per 24 hour  Intake 0 ml  Output 75 ml  Net -75 ml   Weight change:   Physical Exam: GEN - NAD, pleasant CVS- RRR RS- CTA ABD- BS present soft non-distended EXT- 1+ Lower extremities, rt TMA, lt cimino good bruit, left arm swollen      Imaging: No results found.  Labs: BMET Recent Labs  Lab 02/24/18 2050  02/26/18 [id number] 02/27/18 [id number] 02/28/18 0746 02/28/18 1821 02/28/18 2024 03/01/18 0117 03/01/18 0558 03/02/18 0327  NA 151*   < > 152* 152* 144  --  139 140 140 137  K 5.9*   < > 4.6 4.3  4.1  --  3.9 3.8 3.9 3.9  CL 123*   < > 122* 124* 111  --  104 105 105 102  CO2 20*   < > 21* 21* 24  --  26 25 25 23   GLUCOSE 216*   < > 136* 130* 143*  --  123* 121* 109* 131*  BUN 51*   < > 45* 44* 28*  --  16 18 19 18   CREATININE 5.15*   < > 4.80* 5.01* 3.78*  --  2.52* 2.77* 2.94* 2.97*  CALCIUM 8.7*   < > 8.8* 8.7* 8.5*  --  8.3* 8.4* 8.5* 8.7*  PHOS 5.9*  --   --  3.4  --  2.9 3.2 3.3  --   --    < > = values in this interval not displayed.   CBC Recent Labs  Lab 02/28/18 2024 03/01/18 0117 03/01/18 0558 03/02/18 0327  WBC 9.9 10.0 9.9 10.5  HGB 9.7* 9.7* 9.7* 10.3*  HCT 31.1* 30.4* 31.4* 33.3*  MCV 82.7 82.2 82.8 81.2  PLT 187 180 195 196    Medications:    . amLODipine  10 mg Per Tube Daily  . calcium acetate  1,334 mg Oral TID WC  . chlorhexidine  15 mL Mouth  Rinse BID  . Chlorhexidine Gluconate Cloth  6 each Topical Q0600  . cloNIDine  0.3 mg Oral TID  . heparin injection (subcutaneous)  5,000 Units Subcutaneous Q8H  . hydrALAZINE  100 mg Oral Q8H  . insulin aspart  0-15 Units Subcutaneous Q4H  . labetalol  300 mg Oral TID  . mouth rinse  15 mL Mouth Rinse q12n4p  . pantoprazole  40 mg Oral QHS  . senna-docusate  1 tablet Oral BID  . sodium chloride flush  10-40 mL Intracatheter Q12H      Paulene Floor, MD 03/02/2018, 1:50 PM

## 2018-03-02 NOTE — Progress Notes (Signed)
BP 191/78. Hydralazine 20 mg IV administered. Will monitor for effectivness

## 2018-03-02 NOTE — Progress Notes (Signed)
B/P 184/90, HR 77. Patient sleeping.  Unable to give po hydralazine. Gave labetolol 20 mg IV. Will Pass this on to oncoming RN.

## 2018-03-03 LAB — GLUCOSE, CAPILLARY
GLUCOSE-CAPILLARY: 122 mg/dL — AB (ref 65–99)
GLUCOSE-CAPILLARY: 115 mg/dL — AB (ref 65–99)
Glucose-Capillary: 100 mg/dL — ABNORMAL HIGH (ref 65–99)
Glucose-Capillary: 121 mg/dL — ABNORMAL HIGH (ref 65–99)
Glucose-Capillary: 110 mg/dL — ABNORMAL HIGH (ref 65–99)
Glucose-Capillary: 129 mg/dL — ABNORMAL HIGH (ref 65–99)

## 2018-03-03 LAB — CBC
HCT: 33.8 % — ABNORMAL LOW (ref 39.0–52.0)
HEMOGLOBIN: 10.5 g/dL — AB (ref 13.0–17.0)
MCH: 25.1 pg — AB (ref 26.0–34.0)
MCHC: 31.1 g/dL (ref 30.0–36.0)
MCV: 80.9 fL (ref 78.0–100.0)
Platelets: 237 10*3/uL (ref 150–400)
RBC: 4.18 MIL/uL — ABNORMAL LOW (ref 4.22–5.81)
RDW: 15.8 % — AB (ref 11.5–15.5)
WBC: 12.2 10*3/uL — ABNORMAL HIGH (ref 4.0–10.5)

## 2018-03-03 LAB — BASIC METABOLIC PANEL
Anion gap: 11 (ref 5–15)
BUN: 26 mg/dL — AB (ref 6–20)
CALCIUM: 9.1 mg/dL (ref 8.9–10.3)
CHLORIDE: 106 mmol/L (ref 101–111)
CO2: 23 mmol/L (ref 22–32)
CREATININE: 3.67 mg/dL — AB (ref 0.61–1.24)
GFR calc Af Amer: 21 mL/min — ABNORMAL LOW (ref >60)
GFR calc non Af Amer: 18 mL/min — ABNORMAL LOW (ref >60)
Glucose, Bld: 107 mg/dL — ABNORMAL HIGH (ref 65–99)
Potassium: 3.9 mmol/L (ref 3.5–5.1)
Sodium: 140 mmol/L (ref 135–145)

## 2018-03-03 MED ORDER — ACETAMINOPHEN 325 MG PO TABS
ORAL_TABLET | ORAL | Status: AC
  Filled 2018-03-03: qty 2

## 2018-03-03 MED ORDER — ASPIRIN EC 81 MG PO TBEC
81.0000 mg | DELAYED_RELEASE_TABLET | Freq: Every day | ORAL | Status: DC
  Administered 2018-03-03 – 2018-03-06 (×4): 81 mg via ORAL
  Filled 2018-03-03 (×3): qty 1

## 2018-03-03 MED ORDER — CEPHALEXIN 500 MG PO CAPS
500.0000 mg | ORAL_CAPSULE | Freq: Two times a day (BID) | ORAL | Status: DC
  Administered 2018-03-03 – 2018-03-06 (×7): 500 mg via ORAL
  Filled 2018-03-03 (×6): qty 1

## 2018-03-03 NOTE — Progress Notes (Signed)
Patient assisted back into be using maxi move and to other staff members.  Vita BarleySarah Williams from [facility] called asking clinic questions about patient , and update given.

## 2018-03-03 NOTE — Progress Notes (Signed)
Speech Pathology:  Reason Eval/Treat Not Completed: Patient at procedure or test/unavailable Pt at Hemodialysis.  Sanjeev Main L. Samson Fredericouture, [place] CCC/SLP Pager [phone]

## 2018-03-03 NOTE — Progress Notes (Signed)
CSW following for discharge plan. Patient remains unable to maintain seated position for long enough to sit for outpatient dialysis; will need to be seated for over 4 hours prior to transition to SNF.  CSW will continue to follow.  Blenda NicelyElizabeth Shakinah Navis, [place] Clinical Social Worker [phone]

## 2018-03-03 NOTE — Progress Notes (Addendum)
STROKE TEAM PROGRESS NOTE   SUBJECTIVE (INTERVAL HISTORY) R FA sore and tender to touch, knot just below antecubital, red and arm. Increased WBC today. US neg for DVT, superficial thrombosis.   CBC:  Recent Labs  Lab 03/02/18 0327 03/03/18 0354  WBC 10.5 12.2*  HGB 10.3* 10.5*  HCT 33.3* 33.8*  MCV 81.2 80.9  PLT 196 237    Basic Metabolic Panel:  Recent Labs  Lab 02/28/18 2024 03/01/18 0117  03/02/18 0327 03/03/18 0354  NA 139 140   < > 137 140  K 3.9 3.8   < > 3.9 3.9  CL 104 105   < > 102 106  CO2 26 25   < > 23 23  GLUCOSE 123* 121*   < > 131* 107*  BUN 16 18   < > 18 26*  CREATININE 2.52* 2.77*   < > 2.97* 3.67*  CALCIUM 8.3* 8.4*   < > 8.7* 9.1  PHOS 3.2 3.3  --   --   --    < > = values in this interval not displayed.    IMAGING  No results found.    Physical exam: Vitals:   03/02/18 2357 03/03/18 0429 03/03/18 0600 03/03/18 0816  BP: (!) 192/93 (!) 196/82  (!) 189/78  Pulse: 82 81  83  Resp: 18 18  20   Temp: 98.7 F (37.1 C) 98.6 F (37 C)  98.3 F (36.8 C)  TempSrc: Oral Oral  Oral  SpO2: 94% 93%  92%  Weight:   (!) 149.7 kg (330 lb)   Height:       Exam: Gen:  , obese middle-aged African-American male, right forearm warm to touch CV: RRR, no MRG. No Carotid Bruits. No peripheral edema, warm, nontender Eyes: Conjunctivae clear without exudates or hemorrhage Extremities: No toes R foot (amputation). L FA w/ graph site CDI w/ +bruit/thrill. R FA red, warm to touch with large nodule anterior just below antecubital. Neuro: Speech:    Speech is dysarthric with normal comprehension.  Cognition:    The patient is oriented to person, month and year Cranial Nerves:    The pupils are equal, round, and reactive to light.  Visual fields are full to finger confrontation. EOMI. Left lower face weakness. Left lower qudrantanopia.  Hearing intact. Voice is normal. Shoulder shrug is normal.  Motor Observation:  no involuntary movements noted. Strength and  sensation    Dense left hemiparesis and hemisensory loss grade 0/5 strength on the left and normal on the right Reflex Exam: Toes: Left upgoing     ASSESSMENT/PLAN Mr. Loleta ChanceReginald F Schow is a 52 y.o. male with history of hypertension, chronic kidney disease, diabetes, and right transmetatarsal amputation presenting with headache, nausea, and elevated blood pressure. He did not receive IV t-PA due to ICH.  Right BG ICH secondary to uncontrolled hypertension in the setting of CKD stage V  Resultant  Right gaze preference, L lower quadrant field cut, L hemiplegia, dysphagia  CT head 02/16/18  Acute right basal ganglia hemorrhage with estimated blood volume of 28 mL.  CT head 02/17/18 R BG IPH w/ 7mm R to L shift. Evolving L frontotemporal scalp contusion. Small vessel disease. Atrophy. 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  2D Echo - Decreased EF 45-50%. Diffuse hypokinesis.  LDL - 108   HgbA1c - 6.4  VTE prophylaxis - Heparin 5000 units sq tid added, risk outweighs benefit Fall precautions DIET - DYS 1  Room service appropriate? Yes; Fluid consistency: Nectar Thick. ST following  No antithrombotic prior to admission, now on No antithrombotic d/t hmg  Ongoing aggressive stroke risk factor management  Therapy recommendations:  SNF, w/c w/ cushion  Disposition: SNF pending medical stability from renal standpoint  Superficial thrombophlebitis R FA  RUE edema -> venous US neg for DVT, show Superficial thrombophlebitis in R cephalic and basilic FA veins   Leukocytosis, WBC 12.2, increased over weekend  afebrile  Add aspirin 81 mg daily - discussed with Dr. Pearlean Brownie - given hemorrhage was 2 weeks ago, benefits of aspirin treatment of thrombophlebitis outweighs risk  Add keflex 500 mg daily x 7 days  Induced hypernatremia for Cerebral Edema, resolved  Treated with hypertonic saline, now off  Sodium 140  Hypertensive Emergency  BP on  arrival 251/158. Initially treated with cardene, then changed to cleviprex. Drip now off.  SBP goal < 180  PTA meds: norvasc 10, lasix 40, labetalol 200 bid  Now on:  norvasc 10, clonidine 0.3 tid, labetolol 300 tid, hydralazine 100 q 8 hours (increased yesterday)  1st HD 02/25/2018. Hope HD will improve overall BP  BP remains >180, received 2 doses labetolol during the night  189/78 this am, currently in HD  Continue to Monitor BP  Long-term BP goal normotensive  Defer to renal to assist with BP management  CKD stage V, ESRD  Renal US - No hydronephrosis. Left lower pole 9.8 mm nonobstructing stone.  Elevated creatinine. Treated with bicarb supplement, now off Cr 3.78  VVS placed permacath and L FA shunt 02/24/2018  New dialysis 02/25/2017. Plan HD today then TTS per renal  Needs CLIP (will be TTS @ [facility])  Cr 3.67  Nephrology following  Hyperlipidemia  Lipid lowering medication PTA:  none  LDL 108  Consider statin at discharge   Type II Diabetes  HgbA1c 6.4, at goal < 7.0  CBGs 123-153  Controlled  SSI  Other Stroke Risk Factors  Morbid Obesity, Body mass index is 42.35 kg/m.  Hx Stroke:  Previous right PCA infarct by imaging.  Hx diastolic dysfuction. No CHF. EF 45 - 50%  Other Active Problems  Anemia 9.0->8.8->9.7->10.3 - due to CKD. Replacing Fe IV  Hyperkalemia, resolved   Hospital day # 15  Annie Main, MSN, APRN, ANVP-BC, AGPCNP-BC Advanced Practice Stroke Nurse [facility] See Amion for Schedule & Pager information 03/03/2018 9:17 AM  I have personally examined this patient, reviewed notes, independently viewed imaging studies, participated in medical decision making and plan of care.ROS completed by me personally and pertinent positives fully documented  I have made any additions or clarifications directly to the above note. Agree with note above. Recommend treatment for right forearm pain and swelling with cephalexin for 1 week to  treat thrombophlebitis. Patient is unable to sit for 4 hours in a chair and hence will not be able to go to skilled nursing facility and transferred to LTAC would be more appropriate. Greater than 50% time during this 25 minute visit was spent on planning care and making treatment decisions.  Delia Heady, MD Medical Director [facility] Pager: [phone] 03/03/2018 4:53 PM  To contact Stroke Continuity provider, please refer to [email or web address]. After hours, contact General Neurology

## 2018-03-03 NOTE — Progress Notes (Signed)
[facility] Progress Note    Subjective:   No complaints.  Seen on HD while in bed.  No complaints   Objective:   BP (!) 189/78 (BP Location: Right Wrist)   Pulse 83   Temp 98.3 F (36.8 C) (Oral)   Resp 20   Ht 6' 2.02" (1.88 m)   Wt (!) 149.7 kg (330 lb)   SpO2 92%   BMI 42.35 kg/m   Intake/Output: I/O last 3 completed shifts: In: 30 [Other:30] Out: 975 [Urine:925; Stool:50]   Intake/Output this shift:  Total I/O In: 240 [P.O.:240] Out: -  Weight change:   Physical Exam: Gen: NAD CVS: no rub Resp: cta Abd: benign Ext: no edema, L AVF +T/B,  Neuro left hemiparesis  Labs: BMET Recent Labs  Lab 02/24/18 2050  02/27/18 7829 02/28/18 0746 02/28/18 1821 02/28/18 2024 03/01/18 0117 03/01/18 0558 03/02/18 0327 03/03/18 0354  NA 151*   < > 152* 144  --  139 140 140 137 140  K 5.9*   < > 4.3 4.1  --  3.9 3.8 3.9 3.9 3.9  CL 123*   < > 124* 111  --  104 105 105 102 106  CO2 20*   < > 21* 24  --  26 25 25 23 23   GLUCOSE 216*   < > 130* 143*  --  123* 121* 109* 131* 107*  BUN 51*   < > 44* 28*  --  16 18 19 18  26*  CREATININE 5.15*   < > 5.01* 3.78*  --  2.52* 2.77* 2.94* 2.97* 3.67*  ALBUMIN 2.2*  --  2.3*  --   --  2.3* 2.2*  --   --   --   CALCIUM 8.7*   < > 8.7* 8.5*  --  8.3* 8.4* 8.5* 8.7* 9.1  PHOS 5.9*  --  3.4  --  2.9 3.2 3.3  --   --   --    < > = values in this interval not displayed.   CBC Recent Labs  Lab 03/01/18 0117 03/01/18 0558 03/02/18 0327 03/03/18 0354  WBC 10.0 9.9 10.5 12.2*  HGB 9.7* 9.7* 10.3* 10.5*  HCT 30.4* 31.4* 33.3* 33.8*  MCV 82.2 82.8 81.2 80.9  PLT 180 195 196 237    @IMGRELPRIORS @ Medications:    . amLODipine  10 mg Per Tube Daily  . calcium acetate  1,334 mg Oral TID WC  . chlorhexidine  15 mL Mouth Rinse BID  . Chlorhexidine Gluconate Cloth  6 each Topical Q0600  . cloNIDine  0.3 mg Oral TID  . heparin injection (subcutaneous)  5,000 Units Subcutaneous Q8H  . hydrALAZINE  100 mg Oral Q8H   . insulin aspart  0-15 Units Subcutaneous Q4H  . labetalol  300 mg Oral TID  . mouth rinse  15 mL Mouth Rinse q12n4p  . pantoprazole  40 mg Oral QHS  . senna-docusate  1 tablet Oral BID  . sodium chloride flush  10-40 mL Intracatheter Q12H     Assessment/ Plan:   1. 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 ESRD tolerating HD.  Has been accepted at [facility], however he needs to be able to sit in a chair for more than 4 hours before he can go to outpatient dialysis.  He has dense left hemiparesis and not sure he will be able to tolerate outpatient dialysis.   3. Anemia: stable 4. CKD-MBD: stable,  on phoslo 5. Nutrition: renal diet 6. Hypertension: cont to challenge with HD and UF but remains elevated. 7. RUE edema- had dopplers done waiting for results, [facility].   8. Disposition- will need to be able to tolerate over 4 hours in a chair before he can go to outpatient dialysis.  He may require inpatient rehab or LTC facility placement if not able to do so.  Irena CordsJoseph A. Annina Piotrowski, MD [facility], [facility] Pager [phone] 03/03/2018, 9:19 AM

## 2018-03-03 NOTE — Progress Notes (Signed)
Patient off unit for Dialysis and medications held. Received call from dialysis unit indicating patient BP was high and if I could tube BP medicines to station 84. Family arrived and update given. Report received( 3.5hr and 3 liters removed). Patient returned to uni and assessed,t medications held until patient more awake to safely swallow.   PT arrived at and assisted patient into recliner

## 2018-03-03 NOTE — Progress Notes (Signed)
PT Cancellation Note  Patient Details Name: [name] [name] MRN: [id number] DOB: 1966/02/10   Cancelled Treatment:    Reason Eval/Treat Not Completed: (P) Patient at procedure or test/unavailable Pt at Hemodialysis. Will check back this afternoon as able.  [name] [name] PT, DPT Acute Rehabilitation  [phone] Pager [phone]     [name] [name] 03/03/2018, 10:28 AM

## 2018-03-03 NOTE — Progress Notes (Signed)
Pt has to be able to tolerate transport and HD in a chair to receive outpatient dialysis. CM has spoken to PT and bedside RN's about patient being out of the bed greater than 4 hours daily and they are working this into his daily activities.  At this time d/t pts weakness, [facility] Dr Pearlean BrownieSethi and Dr Jacky KindleAronson in agreement.  CM called and spoke to patients wife about LTACHs and she was in agreement. She is willing for the patient to d/c to either facility. CM reached out to [facility] and they have no HD beds available. CM spoke to Loury at [facility] and he is going to look into a potential admission. CM left voice mail for Mrs Rayfield CitizenLeake informing her that [facility] would be contacting her. CM continuing to follow.

## 2018-03-03 NOTE — Progress Notes (Signed)
Physical Therapy Treatment Patient Details Name: [name] [name] MRN: [id number] DOB: 05-30-1966 Today's Date: 03/03/2018    History of Present Illness 52 yr old male with sig history of CKD Stage Iv, HTN, DM, HFpEF, presents with an acute ICH located in the right basal ganglia with midline shift and surrounding edema. s/p HD catheter placed 3/25.    PT Comments    Pt returned from HD and slightly lethargic, answering questions intermittently. Pt able to provide modAx2 for rolling to his L and still requires maxAx2 for rolling to his R for pad placement. Pt requires Maximove for transfer to recliner. Pt unable to keep eyes open to participate in any further PT today. Given pt slow progress towards goals PT recommends LTACH level rehab for progressing his safe mobility. Goals updated today.    Follow Up Recommendations  LTACH     Equipment Recommendations  (TBD)       Precautions / Restrictions Precautions Precautions: Fall Restrictions Weight Bearing Restrictions: No    Mobility  Bed Mobility Overal bed mobility: Needs Assistance Bed Mobility: Rolling Rolling: Total assist;+2 for physical assistance;Mod assist         General bed mobility comments: maxAx2 for rolling R, modAx2 for rolling L for pad placement  Transfers Overall transfer level: Needs assistance               General transfer comment: lift from bed to recliner with maximove, pt seated on Geomat in recliner, RN notified to let pt sit up for 4 hours however visit every half hour to assist in weightshift   Modified Rankin (Stroke Patients Only) Modified Rankin (Stroke Patients Only) Pre-Morbid Rankin Score: No symptoms Modified Rankin: Severe disability     Balance Overall balance assessment: Needs assistance   Sitting balance-Leahy Scale: Poor Sitting balance - Comments: sitting in recliner with head righted                                     Cognition Arousal/Alertness:  Lethargic Behavior During Therapy: Flat affect Overall Cognitive Status: Difficult to assess Area of Impairment: Attention;Following commands                   Current Attention Level: Sustained   Following Commands: Follows one step commands with increased time Safety/Judgement: Decreased awareness of deficits   Problem Solving: Slow processing;Decreased initiation;Requires verbal cues           General Comments General comments (skin integrity, edema, etc.): VSS throughout, pt lethargic from HD and unable to participate in therex once in recliner       Pertinent Vitals/Pain Pain Assessment: No/denies pain           PT Goals (current goals can now be found in the care plan section) Acute Rehab PT Goals PT Goal Formulation: With patient Time For Goal Achievement:  Potential to Achieve Goals: Fair Progress towards PT goals: Progressing toward goals    Frequency    Min 3X/week      PT Plan Discharge plan needs to be updated    Co-evaluation PT/OT/SLP Co-Evaluation/Treatment: Yes            AM-PAC PT "6 Clicks" Daily Activity  Outcome Measure  Difficulty turning over in bed (including adjusting bedclothes, sheets and blankets)?: Unable Difficulty moving from lying on back to sitting on the side of the bed? : Unable Difficulty sitting down on and standing  up from a chair with arms (e.g., wheelchair, bedside commode, etc,.)?: Unable Help needed moving to and from a bed to chair (including a wheelchair)?: Total Help needed walking in hospital room?: Total Help needed climbing 3-5 steps with a railing? : Total 6 Click Score: 6    End of Session   Activity Tolerance: Patient limited by lethargy Patient left: with call bell/phone within reach;in chair Nurse Communication: Mobility status;Need for lift equipment(weightshift) PT Visit Diagnosis: Hemiplegia and hemiparesis;Other abnormalities of gait and mobility (R26.89);Other symptoms and signs  involving the nervous system (R29.898) Hemiplegia - Right/Left: Left Hemiplegia - dominant/non-dominant: Non-dominant Hemiplegia - caused by: Nontraumatic SAH     Time: 1610-9604 PT Time Calculation (min) (ACUTE ONLY): 21 min  Charges:  $Therapeutic Activity: 8-22 mins                    G Codes:       [name], DPT Acute Rehabilitation  [phone] Pager [phone]     [name] 03/03/2018, 5:02 PM

## 2018-03-04 LAB — GLUCOSE, CAPILLARY
GLUCOSE-CAPILLARY: 116 mg/dL — AB (ref 65–99)
GLUCOSE-CAPILLARY: 152 mg/dL — AB (ref 65–99)
GLUCOSE-CAPILLARY: 150 mg/dL — AB (ref 65–99)
Glucose-Capillary: 101 mg/dL — ABNORMAL HIGH (ref 65–99)
Glucose-Capillary: 130 mg/dL — ABNORMAL HIGH (ref 65–99)
Glucose-Capillary: 157 mg/dL — ABNORMAL HIGH (ref 65–99)
Glucose-Capillary: 181 mg/dL — ABNORMAL HIGH (ref 65–99)

## 2018-03-04 MED ORDER — WHITE PETROLATUM EX OINT
TOPICAL_OINTMENT | CUTANEOUS | Status: AC
  Administered 2018-03-04: 0.2
  Filled 2018-03-04: qty 28.35

## 2018-03-04 NOTE — Progress Notes (Signed)
  Speech Language Pathology Treatment: Dysphagia;Cognitive-Linquistic  Patient Details Name: [name] [name] MRN: [id number] DOB: 29-Nov-1966 Today's Date: 03/04/2018 Time: [id number] SLP Time Calculation (min) (ACUTE ONLY): 33 min  Assessment / Plan / Recommendation Clinical Impression  Pt today is full alert and tolerating intake of cracker, fruit, applesauce and water without indication of airway compromise.  He does continue with fairly significant oral pocketing on left, however with verbal cues (mod) to clear, he is effective with multiple strategies including use of spoon sweep, swish and swallow with water, applesauce consumption.  As pt did cough  During aspiration of thin on FEES study and today NO indications whatsoever - recommend advance diet to dys3/ground meats/thin with strict precautions.  Pt demonstrating improved awareness to his dysphagia and using teach back able to verbalize precautions and clinical reasoning.  He also demonstrates good awareness of extensive left sided weakness and provides assistance to SLP for repositioning.  He is continuing to progress but recommend continued full supervision to maximize airway protection with intake.   Of note, pt unable to read the swallow precaution sign I left for him and her reports he only has contacts (which are not currently in place).  Advised him to see if family could get him a pair of glasses with his prescription*call optometrist* as he can not wear his glasses currently and this is limiting some participation.  Thanks.     HPI HPI: Pt is a 52 y.o.malewith history of hypertension, chronic kidney disease, diabetes, and right transmetatarsal amputationpresenting with headache, nausea, and elevated blood pressure, found to haveacute ICH located in the right basal ganglia with midline shift and surrounding edema.       SLP Plan  Continue with current plan of care       Recommendations  Diet recommendations: Dysphagia 3  (mechanical soft);Thin liquid Liquids provided via: Cup;No straw;Teaspoon Medication Administration: Whole meds with puree(applesauce) Supervision: Patient able to self feed;Full supervision/cueing for compensatory strategies Compensations: Minimize environmental distractions;Slow rate;Small sips/bites;Lingual sweep for clearance of pocketing(clean mouth after meals) Postural Changes and/or Swallow Maneuvers: Seated upright 90 degrees;Upright 30-60 min after meal                Oral Care Recommendations: Oral care BID Follow up Recommendations: Skilled Nursing facility SLP Visit Diagnosis: Dysphagia, oropharyngeal phase (R13.12) Plan: Continue with current plan of care       GO                [name], Shaena Parkerson Ann 03/04/2018, 10:52 AM [name] Kinser Fellman, MS [facility] SLP [phone]

## 2018-03-04 NOTE — Progress Notes (Signed)
[facility] Progress Note    Subjective:   Reported some neck stiffness at end of dialysis yesterday but better today   Objective:   BP 136/71 (BP Location: Right Arm)   Pulse 74   Temp 98.4 F (36.9 C) (Oral)   Resp 18   Ht 6' 2.02" (1.88 m)   Wt (!) 146.6 kg (323 lb 3.1 oz)   SpO2 94%   BMI 41.48 kg/m   Intake/Output: I/O last 3 completed shifts: In: 378 [P.O.:345; I.V.:3; Other:30] Out: 3050 [Other:3000; Stool:50]   Intake/Output this shift:  No intake/output data recorded. Weight change: 0 kg (0 lb)  Physical Exam: Gen: nad CVS: no rub Resp: cta Abd: benign Ext: no edema, LAVF +T/B  Labs: BMET Recent Labs  Lab 02/27/18 [id number] 02/28/18 0746 02/28/18 1821 02/28/18 2024 03/01/18 0117 03/01/18 0558 03/02/18 0327 03/03/18 0354  NA 152* 144  --  139 140 140 137 140  K 4.3 4.1  --  3.9 3.8 3.9 3.9 3.9  CL 124* 111  --  104 105 105 102 106  CO2 21* 24  --  26 25 25 23 23   GLUCOSE 130* 143*  --  123* 121* 109* 131* 107*  BUN 44* 28*  --  16 18 19 18  26*  CREATININE 5.01* 3.78*  --  2.52* 2.77* 2.94* 2.97* 3.67*  ALBUMIN 2.3*  --   --  2.3* 2.2*  --   --   --   CALCIUM 8.7* 8.5*  --  8.3* 8.4* 8.5* 8.7* 9.1  PHOS 3.4  --  2.9 3.2 3.3  --   --   --    CBC Recent Labs  Lab 03/01/18 0117 03/01/18 0558 03/02/18 0327 03/03/18 0354  WBC 10.0 9.9 10.5 12.2*  HGB 9.7* 9.7* 10.3* 10.5*  HCT 30.4* 31.4* 33.3* 33.8*  MCV 82.2 82.8 81.2 80.9  PLT 180 195 196 237    @IMGRELPRIORS @ Medications:    . amLODipine  10 mg Per Tube Daily  . aspirin EC  81 mg Oral Daily  . calcium acetate  1,334 mg Oral TID WC  . cephALEXin  500 mg Oral Q12H  . chlorhexidine  15 mL Mouth Rinse BID  . Chlorhexidine Gluconate Cloth  6 each Topical Q0600  . cloNIDine  0.3 mg Oral TID  . heparin injection (subcutaneous)  5,000 Units Subcutaneous Q8H  . hydrALAZINE  100 mg Oral Q8H  . insulin aspart  0-15 Units Subcutaneous Q4H  . labetalol  300 mg Oral TID  .  mouth rinse  15 mL Mouth Rinse q12n4p  . pantoprazole  40 mg Oral QHS  . senna-docusate  1 tablet Oral BID  . sodium chloride flush  10-40 mL Intracatheter Q12H     Assessment/ Plan:   1. 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 ESRD tolerating HD.  [facility], however he needs to be able to sit in a chair for more than 4 hours before he can go to outpatient dialysis.  He has dense left hemiparesis/hemiplegia and not sure he will be able to tolerate outpatient dialysis.   3. Anemia: stable 4. CKD-MBD: stable, on phoslo 5. Nutrition: renal diet 6. Vascular access- RIJ TDC and LAVF created on 02/24/18 by Dr. Arbie CookeyEarly 7. Hypertension: cont to challenge with HD and UF but remains elevated. 8. RUE edema- had dopplers done waiting for results, [facility].  9. Disposition- will need to be able to tolerate over 4 hours in a chair before he can go to outpatient dialysis.  He may require inpatient rehab or LTC facility placement if not able to do so.  According to the notes, family is amenable to LTC placement.    Irena Cords, MD [facility], [facility] Pager [phone] 03/04/2018, 12:07 PM

## 2018-03-04 NOTE — Progress Notes (Signed)
Physical Therapy Treatment Patient Details Name: [name] [name] MRN: [id number] DOB: 07/10/1966 Today's Date: 03/04/2018    History of Present Illness 52 yr old male with sig history of CKD Stage Iv, HTN, DM, HFpEF, presents with an acute ICH located in the right basal ganglia with midline shift and surrounding edema. s/p HD catheter placed 3/25.    PT Comments    Pt moved to recliner with Maximove. Once in recliner pt able to participate in seated exercises. AROM on R side, AAROM for L hip and PROM for L knee and ankle. Pt to sit up in recliner for 4 hours to build tolerance for HD. Discharge plans remain appropriate at this time.    Follow Up Recommendations  LTACH     Equipment Recommendations  (TBD)    Recommendations for Other Services       Precautions / Restrictions Precautions Precautions: Fall Restrictions Weight Bearing Restrictions: No    Mobility  Bed Mobility Overal bed mobility: Needs Assistance Bed Mobility: Rolling Rolling: Total assist;+2 for physical assistance;Mod assist         General bed mobility comments: maxAx2 for rolling R, modAx2 for rolling L for pad placement  Transfers Overall transfer level: Needs assistance               General transfer comment: lift from bed to recliner with maximove, pt seated on Geomat in recliner, RN notified to let pt sit up for 4 hours however visit every half hour to assist in weightshift   Modified Rankin (Stroke Patients Only) Modified Rankin (Stroke Patients Only) Pre-Morbid Rankin Score: No symptoms Modified Rankin: Severe disability     Balance Overall balance assessment: Needs assistance   Sitting balance-Leahy Scale: Poor Sitting balance - Comments: sitting in recliner with head righted                                     Cognition Arousal/Alertness: Lethargic Behavior During Therapy: Flat affect Overall Cognitive Status: Difficult to assess Area of Impairment:  Attention;Following commands                   Current Attention Level: Sustained   Following Commands: Follows one step commands with increased time Safety/Judgement: Decreased awareness of deficits   Problem Solving: Slow processing;Decreased initiation;Requires verbal cues        Exercises General Exercises - Lower Extremity Quad Sets: AROM;Right;5 reps;Seated Gluteal Sets: AROM;Both;10 reps;Seated(pt reports he feels some L glute activation ) Long Arc Quad: AROM;Right;AAROM;Left;10 reps;Seated Hip ABduction/ADduction: AROM;Right;10 reps;Seated Hip Flexion/Marching: AROM;Right;AAROM;Left;10 reps;Seated Heel Raises: AROM;Right;10 reps;Seated Mini-Sqauts: AROM;Right;10 reps;Seated    General Comments General comments (skin integrity, edema, etc.): VSS       Pertinent Vitals/Pain Pain Assessment: No/denies pain           PT Goals (current goals can now be found in the care plan section) Acute Rehab PT Goals PT Goal Formulation: With patient Time For Goal Achievement:  Potential to Achieve Goals: Fair    Frequency    Min 3X/week      PT Plan Discharge plan needs to be updated    Co-evaluation              AM-PAC PT "6 Clicks" Daily Activity  Outcome Measure  Difficulty turning over in bed (including adjusting bedclothes, sheets and blankets)?: Unable Difficulty moving from lying on back to sitting on the side of the bed? :  Unable Difficulty sitting down on and standing up from a chair with arms (e.g., wheelchair, bedside commode, etc,.)?: Unable Help needed moving to and from a bed to chair (including a wheelchair)?: Total Help needed walking in hospital room?: Total Help needed climbing 3-5 steps with a railing? : Total 6 Click Score: 6    End of Session   Activity Tolerance: Patient limited by lethargy Patient left: with call bell/phone within reach;in chair Nurse Communication: Mobility status;Need for lift  equipment(weightshift) PT Visit Diagnosis: Hemiplegia and hemiparesis;Other abnormalities of gait and mobility (R26.89);Other symptoms and signs involving the nervous system (R29.898) Hemiplegia - Right/Left: Left Hemiplegia - dominant/non-dominant: Non-dominant Hemiplegia - caused by: Nontraumatic SAH     Time: 1610-9604 PT Time Calculation (min) (ACUTE ONLY): 20 min  Charges:  $Therapeutic Exercise: 8-22 mins                    G Codes:       Stiles Maxcy B. Beverely [name], DPT Acute Rehabilitation  [phone] Pager [phone]    [name] 03/04/2018, 1:07 PM

## 2018-03-04 NOTE — Progress Notes (Signed)
Patient sat in the recliner from 11:30am to 4pm ,  Tolerated sitting fairly well.

## 2018-03-04 NOTE — Progress Notes (Addendum)
STROKE TEAM PROGRESS NOTE   SUBJECTIVE (INTERVAL HISTORY) R FA less red and tender to touch today. Patient up in chair working with physical therapist. Aware plan looking for LTAC versus SNIF. No new complaints.  Passed swallow today, now able to have thin liquids   CBC:  Recent Labs  Lab 03/02/18 0327 03/03/18 0354  WBC 10.5 12.2*  HGB 10.3* 10.5*  HCT 33.3* 33.8*  MCV 81.2 80.9  PLT 196 237    Basic Metabolic Panel:  Recent Labs  Lab 02/28/18 2024 03/01/18 0117  03/02/18 0327 03/03/18 0354  NA 139 140   < > 137 140  K 3.9 3.8   < > 3.9 3.9  CL 104 105   < > 102 106  CO2 26 25   < > 23 23  GLUCOSE 123* 121*   < > 131* 107*  BUN 16 18   < > 18 26*  CREATININE 2.52* 2.77*   < > 2.97* 3.67*  CALCIUM 8.3* 8.4*   < > 8.7* 9.1  PHOS 3.2 3.3  --   --   --    < > = values in this interval not displayed.    IMAGING  No results found.   Vitals:   03/04/18 0041 03/04/18 0425 03/04/18 0900 03/04/18 1209  BP: (!) 176/74 139/84 136/71 (!) 162/75  Pulse: 79 72 74 73  Resp: 20 20 18 18   Temp: 98.5 F (36.9 C) 98.5 F (36.9 C) 98.4 F (36.9 C) 98.1 F (36.7 C)  TempSrc: Oral Oral Oral Oral  SpO2: 97% 92% 94% 92%  Weight:      Height:       Exam: Gen:  obese middle-aged African-American male, comfortable appearance CV: RRR, no MRG. No Carotid Bruits. No peripheral edema, warm, nontender Eyes: Conjunctivae clear without exudates or hemorrhage Extremities: No toes R foot (amputation). L FA w/ graph site CDI w/ +bruit/thrill. R FA still red but less so, warm to touch with large nodule anterior just below antecubital. Neuro: Speech:    Speech is dysarthric with normal comprehension.  Cognition:    The patient is oriented to person, month and year Cranial Nerves:    The pupils are equal, round, and reactive to light.  Visual fields are full to finger confrontation. EOMI. Left lower face weakness. Left lower qudrantanopia.  Hearing intact. Voice is normal. Shoulder shrug  is normal.  Motor Observation:  no involuntary movements noted. Strength and sensation    Dense left hemiparesis and hemisensory loss grade 0/5 strength on the left and normal on the right. Increasing hip flexor strength on the left Reflex Exam: Toes: Left upgoing     ASSESSMENT/PLAN [name] [name] is a 52 y.o. male with history of hypertension, chronic kidney disease, diabetes, and right transmetatarsal amputation presenting with headache, nausea, and elevated blood pressure. He did not receive IV t-PA due to ICH.  Right BG ICH secondary to uncontrolled hypertension in the setting of CKD stage V  Resultant  Right gaze preference, L lower quadrant field cut, L hemiplegia, dysphagia  CT head 02/16/18  Acute right basal ganglia hemorrhage with estimated blood volume of 28 mL.  CT head 02/17/18 R BG IPH w/ 7mm R to L shift. Evolving L frontotemporal scalp contusion. Small vessel disease. Atrophy. 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  2D Echo - Decreased EF 45-50%. Diffuse hypokinesis.  LDL - 108   HgbA1c - 6.4  VTE prophylaxis -  Heparin 5000 units sq tid  Fall precautions DIET DYS 3 Room service appropriate? Yes; Fluid consistency: Thin. ST following  No antithrombotic prior to admission, now on No antithrombotic d/t hmg  Ongoing aggressive stroke risk factor management  Therapy recommendations:  SNF vs LTAC, w/c w/ cushion  Disposition: SNF vs LTAC. Case Production designer, theatre/television/filmmanager. Following  Superficial thrombophlebitis R FA  RUE edema -> venous US neg for DVT, show Superficial thrombophlebitis in R cephalic and basilic FA veins. Improved redness in right forearm since yesterday.  Leukocytosis, WBC 12.2, increased over weekend. Repeat Wednesday  Afebrile  Added aspirin 81 mg daily   Added keflex 500 mg daily x 7 days, D2/7    Hypertensive Emergency  BP on arrival 251/158. Initially treated with cardene, then changed to cleviprex. Drip  now off.  SBP goal < 180  PTA meds: norvasc 10, lasix 40, labetalol 200 bid  Now on:  norvasc 10, clonidine 0.3 tid, labetolol 300 tid, hydralazine 100 q 8 hours (increased yesterday)  1st HD 02/25/2018. Hope HD will improve overall BP  Blood pressure less than 180. Since yesterday afternoon.  Continue to Monitor BP  Long-term BP goal normotensive  Dr. Pearlean BrownieSethi discussed with pharmacy. Consider increasing labetalol should blood pressure continue to be greater than 180  CKD stage V, ESRD  Renal US - No hydronephrosis. Left lower pole 9.8 mm nonobstructing stone.  Elevated creatinine. Treated with bicarb supplement, now off Cr 3.78  VVS placed permacath and L FA shunt 02/24/2018  New dialysis 02/25/2017. Plan HD today then TTS per renal  Needs CLIP (will be TTS @ [facility])  Cr 3.67  Nephrology following  Hyperlipidemia  Lipid lowering medication PTA:  none  LDL 108  Consider statin at discharge   Type II Diabetes  HgbA1c 6.4, at goal < 7.0  CBGs 101-152  Controlled  SSI  Other Stroke Risk Factors  Morbid Obesity, Body mass index is 41.48 kg/m.  Hx Stroke:  Previous right PCA infarct by imaging.  Hx diastolic dysfuction. No CHF. EF 45 - 50%  Other Active Problems  Anemia 9.0->8.8->9.7->10.3 - due to CKD. Replacing Fe IV  Hyperkalemia, resolved   Hospital day # 16  Annie MainSharon Biby, MSN, APRN, ANVP-BC, AGPCNP-BC Advanced Practice Stroke Nurse [facility] See Amion for Schedule & Pager information 03/04/2018 3:05 PM  I have personally examined this patient, reviewed notes, independently viewed imaging studies, participated in medical decision making and plan of care.ROS completed by me personally and pertinent positives fully documented  I have made any additions or clarifications directly to the above note. Agree with note above.   Delia HeadyPramod Nadine Ryle, MD Medical Director [facility] Pager: [phone] 03/04/2018 4:06 PM  To contact Stroke  Continuity provider, please refer to [email or web address]. After hours, contact General Neurology

## 2018-03-05 LAB — GLUCOSE, CAPILLARY
GLUCOSE-CAPILLARY: 123 mg/dL — AB (ref 65–99)
Glucose-Capillary: 141 mg/dL — ABNORMAL HIGH (ref 65–99)
Glucose-Capillary: 125 mg/dL — ABNORMAL HIGH (ref 65–99)
Glucose-Capillary: 141 mg/dL — ABNORMAL HIGH (ref 65–99)
Glucose-Capillary: 159 mg/dL — ABNORMAL HIGH (ref 65–99)

## 2018-03-05 LAB — RENAL FUNCTION PANEL
ALBUMIN: 2.3 g/dL — AB (ref 3.5–5.0)
Anion gap: 12 (ref 5–15)
BUN: 38 mg/dL — ABNORMAL HIGH (ref 6–20)
CALCIUM: 9.2 mg/dL (ref 8.9–10.3)
CO2: 24 mmol/L (ref 22–32)
Chloride: 98 mmol/L — ABNORMAL LOW (ref 101–111)
Creatinine, Ser: 5.46 mg/dL — ABNORMAL HIGH (ref 0.61–1.24)
GFR calc Af Amer: 13 mL/min — ABNORMAL LOW (ref >60)
GFR calc non Af Amer: 11 mL/min — ABNORMAL LOW (ref >60)
GLUCOSE: 149 mg/dL — AB (ref 65–99)
PHOSPHORUS: 6.1 mg/dL — AB (ref 2.5–4.6)
Potassium: 4.1 mmol/L (ref 3.5–5.1)
SODIUM: 134 mmol/L — AB (ref 135–145)

## 2018-03-05 LAB — CBC
HCT: 32.4 % — ABNORMAL LOW (ref 39.0–52.0)
Hemoglobin: 10.6 g/dL — ABNORMAL LOW (ref 13.0–17.0)
MCH: 26 pg (ref 26.0–34.0)
MCHC: 32.7 g/dL (ref 30.0–36.0)
MCV: 79.4 fL (ref 78.0–100.0)
PLATELETS: 245 10*3/uL (ref 150–400)
RBC: 4.08 MIL/uL — AB (ref 4.22–5.81)
RDW: 15.8 % — AB (ref 11.5–15.5)
WBC: 11.5 10*3/uL — AB (ref 4.0–10.5)

## 2018-03-05 NOTE — Progress Notes (Addendum)
Pt transported to HD  Pt returned from HD @ 13:40

## 2018-03-05 NOTE — Progress Notes (Addendum)
I was present at this dialysis session. I have reviewed the session itself and made appropriate changes.  He is currently sitting in recliner and tolerating HD, however he required [employer] to get in recliner and not sure he will be able to go to outpatient HD as they are not equipped to handle someone with max assistance.  Filed Weights   03/03/18 0850 03/03/18 1230 03/05/18 0836  Weight: (!) 149.7 kg (330 lb) (!) 146.6 kg (323 lb 3.1 oz) (!) 147 kg (324 lb 1.2 oz)    Recent Labs  Lab 03/01/18 0117  03/03/18 0354  NA 140   < > 140  K 3.8   < > 3.9  CL 105   < > 106  CO2 25   < > 23  GLUCOSE 121*   < > 107*  BUN 18   < > 26*  CREATININE 2.77*   < > 3.67*  CALCIUM 8.4*   < > 9.1  PHOS 3.3  --   --    < > = values in this interval not displayed.    Recent Labs  Lab 03/02/18 0327 03/03/18 0354 03/05/18 0300  WBC 10.5 12.2* 11.5*  HGB 10.3* 10.5* 10.6*  HCT 33.3* 33.8* 32.4*  MCV 81.2 80.9 79.4  PLT 196 237 245    Scheduled Meds: . amLODipine  10 mg Per Tube Daily  . aspirin EC  81 mg Oral Daily  . calcium acetate  1,334 mg Oral TID WC  . cephALEXin  500 mg Oral Q12H  . chlorhexidine  15 mL Mouth Rinse BID  . Chlorhexidine Gluconate Cloth  6 each Topical Q0600  . cloNIDine  0.3 mg Oral TID  . heparin injection (subcutaneous)  5,000 Units Subcutaneous Q8H  . hydrALAZINE  100 mg Oral Q8H  . insulin aspart  0-15 Units Subcutaneous Q4H  . labetalol  300 mg Oral TID  . mouth rinse  15 mL Mouth Rinse q12n4p  . pantoprazole  40 mg Oral QHS  . senna-docusate  1 tablet Oral BID  . sodium chloride flush  10-40 mL Intracatheter Q12H   Continuous Infusions: . sodium chloride    . sodium chloride Stopped (02/25/18 2200)   PRN Meds:.sodium chloride, acetaminophen **OR** acetaminophen (TYLENOL) oral liquid 160 mg/5 mL **OR** acetaminophen, hydrALAZINE, labetalol, phenol, RESOURCE THICKENUP CLEAR, sodium chloride flush    Assessment and Plan: 1. Right Basal ganglia hemorrhagic  stroke with left hemiparesis due to poorly controlled HTN 2. ESRDtolerating HD. [facility], however he needs to be able to sit in a chair for more than 4 hours before he can go to outpatient dialysis. He has dense left hemiparesis/hemiplegia and not sure he will be able to tolerate outpatient dialysis or be able to transfer from wheelchair to recliner at the outpatient setting..  3. Anemia:stable 4. CKD-MBD:stable, on phoslo 5. Nutrition:renal diet 6. Vascular access- RIJ TDC and LAVF created on 02/24/18 by Dr. Arbie CookeyEarly 7. Hypertension:cont to challenge with HD and UF but remains elevated. 8. RUE edema- had dopplers done waiting for results, [facility].  9. Disposition- will need to be able to tolerate over 4 hours in a chair before he can go to outpatient dialysis. He may require inpatient rehab or LTC facility placement if not able to do so.  According to the notes, family is amenable to LTC placement.   Irena CordsJoseph A Ho Parisi,  MD 03/05/2018, 9:18 AM

## 2018-03-05 NOTE — Progress Notes (Addendum)
STROKE TEAM PROGRESS NOTE   SUBJECTIVE (INTERVAL HISTORY) Patient in HD. Sleepy but awakes easily. R FA less red and less tender. Pt with no new complaints.   CBC:  Recent Labs  Lab 03/03/18 0354 03/05/18 0300  WBC 12.2* 11.5*  HGB 10.5* 10.6*  HCT 33.8* 32.4*  MCV 80.9 79.4  PLT 237 245    Basic Metabolic Panel:  Recent Labs  Lab 03/01/18 0117  03/03/18 0354 03/05/18 0854  NA 140   < > 140 134*  K 3.8   < > 3.9 4.1  CL 105   < > 106 98*  CO2 25   < > 23 24  GLUCOSE 121*   < > 107* 149*  BUN 18   < > 26* 38*  CREATININE 2.77*   < > 3.67* 5.46*  CALCIUM 8.4*   < > 9.1 9.2  PHOS 3.3  --   --  6.1*   < > = values in this interval not displayed.    IMAGING  No results found.   Vitals:   03/05/18 0836 03/05/18 0900 03/05/18 0930 03/05/18 1000  BP: (!) 150/80 (!) 155/85 (!) 148/93 (!) 144/88  Pulse: 72 72 72 70  Resp: 20 20 18 18   Temp:      TempSrc:      SpO2:      Weight: (!) 147 kg (324 lb 1.2 oz)     Height:       Exam: Gen:  obese middle-aged African-American male, comfortable appearance CV: RRR, no MRG. No Carotid Bruits. No peripheral edema, warm, nontender Eyes: Conjunctivae clear without exudates or hemorrhage Extremities: No toes R foot (amputation). L FA w/ graph site CDI w/ +bruit/thrill. R FA discolored but redness fading, no longer warm to touch, large nodule anterior just below antecubital remains Skin: base of R neck with sutures, CD&I. Mild erythema improving Neuro: Speech:    Speech is dysarthric with normal comprehension.  Cognition:    The patient is oriented to person, month and year Cranial Nerves:    The pupils are equal, round, and reactive to light.  Visual fields are full to finger confrontation. EOMI. Left lower face weakness. Left lower qudrantanopia.  Hearing intact. Voice is normal. Shoulder shrug is normal.  Motor Observation:  no involuntary movements noted. Strength and sensation    Dense left hemiparesis and hemisensory  loss grade 0/5 strength on the left and normal on the right. Increasing hip flexor strength on the left Reflex Exam: Toes: Left upgoing     ASSESSMENT/PLAN [name] with history of hypertension, chronic kidney disease, diabetes, and right transmetatarsal amputation presenting with headache, nausea, and elevated blood pressure. He did not receive IV t-PA due to ICH.  Right BG ICH secondary to uncontrolled hypertension in the setting of CKD stage V  Resultant  Right gaze preference, L lower quadrant field cut, L hemiplegia, dysphagia  CT head 02/16/18  Acute right basal ganglia hemorrhage with estimated blood volume of 28 mL.  CT head 02/17/18 R BG IPH w/ 7mm R to L shift. Evolving L frontotemporal scalp contusion. Small vessel disease. Atrophy. 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  2D Echo - Decreased EF 45-50%. Diffuse hypokinesis.  LDL - 108   HgbA1c - 6.4  VTE prophylaxis - Heparin 5000 units sq tid  Fall precautions DIET DYS 3 Room service appropriate? Yes; Fluid consistency: Thin. ST following  No  antithrombotic prior to admission, now on No antithrombotic d/t hmg  Ongoing aggressive stroke risk factor management  Therapy recommendations:  SNF vs LTAC, w/c w/ cushion  Disposition: SNF vs LTAC. Case manager following  Superficial thrombophlebitis R FA  RUE edema -> venous US neg for DVT, show Superficial thrombophlebitis in R cephalic and basilic FA veins. Improved redness in right forearm since yesterday.  Leukocytosis, WBC 12.2->11.5 after 2 days abx  Afebrile  Treating with aspirin 81 mg daily   keflex 500 mg daily x 7 days, D3/7   Hypertensive Emergency  BP on arrival 251/158. Initially treated with cardene, then changed to cleviprex. Drip now off.  SBP goal < 180  PTA meds: norvasc 10, lasix 40, labetalol 200 bid  Now on:  norvasc 10, clonidine 0.3 tid, labetolol 300 tid, hydralazine  100 q 8 hours (increased yesterday)  1st HD 02/25/2018  Blood pressure now stabilizing less than 180 consistently  Continue to Monitor BP.   Long-term BP goal normotensive  May need to adjust BP meds if BP continues to drop and remain low  CKD stage V, ESRD  Renal US - No hydronephrosis. Left lower pole 9.8 mm nonobstructing stone.  Elevated creatinine. Treated with bicarb supplement  VVS placed permacath and L FA shunt 02/24/2018  New dialysis 02/25/2017.   To HD today  CLIP (will be TTS @ [facility])  Nephrology following  Hyperlipidemia  Lipid lowering medication PTA:  none  LDL 108  Consider statin at discharge   Type II Diabetes  HgbA1c 6.4, at goal < 7.0  CBGs 125-157  Controlled  SSI  Other Stroke Risk Factors  Morbid Obesity, Body mass index is 41.59 kg/m.  Hx Stroke:  Previous right PCA infarct by imaging.  Hx diastolic dysfuction. No CHF. EF 45 - 50%  Other Active Problems  Anemia 9.0->10.6 - due to CKD. Improving. Replacing Fe IV  Hyperkalemia, resolved   Hospital day # 17  Annie Main, MSN, APRN, ANVP-BC, AGPCNP-BC Advanced Practice Stroke Nurse [facility] See Amion for Schedule & Pager information 03/05/2018 11:17 AM  I have personally examined this patient, reviewed notes, independently viewed imaging studies, participated in medical decision making and plan of care.ROS completed by me personally and pertinent positives fully documented  I have made any additions or clarifications directly to the above note. Agree with note above. Await possible [facility] to [facility] when bed available  Delia Heady, MD Medical Director [facility] Pager: [phone] 03/05/2018 11:17 AM  To contact Stroke Continuity provider, please refer to [email or web address]. After hours, contact General Neurology

## 2018-03-05 NOTE — Progress Notes (Addendum)
  Speech Language Pathology Treatment: Dysphagia  Patient Details Name: [name] [name]: [id number] DOB: Sep 16, 1966 Today's Date: 03/05/2018 Time: 0725-0755 SLP Time Calculation (min) (ACUTE ONLY): 30 min  Assessment / Plan / Recommendation Clinical Impression  Today pt expressed frustration with medication administration stating that the RN was "shoving medicine into his mouth" although he was stating he hadn't cleared yet and needed time.  Advised pt to recommendations for medications to be given with applesauce and pt to administer it himself.    Provided written instructions posted above and across from bed.  Advised RN to pt's concerns and reviewed medication administration procedure.    Pt assisted to brush his teeth - and was educated to importance of dental brushing [name] and pm.  Observed pt with intake of pancakes, water and milk.   No significant oral pocketing today after pt was cued to assess and assure clearance using Mod I.    Recommend continue intermittent supervision as pt able to state precautions but benefited from min cues to follow as he is impulsive.  Will continue to follow for dysphagia and cognitive linguistic treatment.        HPI HPI: Pt is a 52 y.o.malewith history of hypertension, chronic kidney disease, diabetes, and right transmetatarsal amputationpresenting with headache, nausea, and elevated blood pressure, found to haveacute ICH located in the right basal ganglia with midline shift and surrounding edema. Pt has undergone two FEES studies and last study showed aspiration of thin with cough response.  RN made this SLP aware of pt's displeasure with his diet and medication administration.        SLP Plan  Continue with current plan of care       Recommendations  Liquids provided via: Cup;No straw;Teaspoon Medication Administration: Whole meds with puree(applesauce) Supervision: Patient able to self feed;Full supervision/cueing for compensatory  strategies Compensations: Minimize environmental distractions;Slow rate;Small sips/bites;Lingual sweep for clearance of pocketing(clean mouth after meals) Postural Changes and/or Swallow Maneuvers: Seated upright 90 degrees;Upright 30-60 min after meal                Oral Care Recommendations: Oral care BID Follow up Recommendations: Skilled Nursing facility SLP Visit Diagnosis: Dysphagia, oropharyngeal phase (R13.12) Plan: Continue with current plan of care       GO               [name] Burnetamara [name], MS [facility] SLP [phone]  [name] AbrahamsKimball, [name] [name] 03/05/2018, 8:04 [name]

## 2018-03-06 DIAGNOSIS — D509 Iron deficiency anemia, unspecified: Secondary | ICD-10-CM

## 2018-03-06 DIAGNOSIS — E785 Hyperlipidemia, unspecified: Secondary | ICD-10-CM

## 2018-03-06 DIAGNOSIS — I809 Phlebitis and thrombophlebitis of unspecified site: Secondary | ICD-10-CM

## 2018-03-06 LAB — GLUCOSE, CAPILLARY
GLUCOSE-CAPILLARY: 125 mg/dL — AB (ref 65–99)
GLUCOSE-CAPILLARY: 168 mg/dL — AB (ref 65–99)
Glucose-Capillary: 127 mg/dL — ABNORMAL HIGH (ref 65–99)
Glucose-Capillary: 165 mg/dL — ABNORMAL HIGH (ref 65–99)

## 2018-03-06 MED ORDER — SENNOSIDES-DOCUSATE SODIUM 8.6-50 MG PO TABS: 1.0000 | ORAL_TABLET | Freq: Two times a day (BID) | ORAL | 2 refills | Status: AC

## 2018-03-06 MED ORDER — ASPIRIN 81 MG PO TBEC: 81.0000 mg | DELAYED_RELEASE_TABLET | Freq: Every day | ORAL | 2 refills | Status: AC

## 2018-03-06 MED ORDER — ATORVASTATIN CALCIUM 10 MG PO TABS: 10.0000 mg | ORAL_TABLET | Freq: Every day | ORAL | 2 refills | Status: AC

## 2018-03-06 MED ORDER — CEPHALEXIN 500 MG PO CAPS: 500.0000 mg | ORAL_CAPSULE | Freq: Two times a day (BID) | ORAL | 0 refills | Status: AC

## 2018-03-06 MED ORDER — CALCIUM ACETATE (PHOS BINDER) 667 MG PO CAPS: 1334.0000 mg | ORAL_CAPSULE | Freq: Three times a day (TID) | ORAL | 2 refills | Status: AC

## 2018-03-06 MED ORDER — AMLODIPINE BESYLATE 10 MG PO TABS: 10.0000 mg | ORAL_TABLET | Freq: Every day | ORAL | 2 refills | Status: AC

## 2018-03-06 MED ORDER — ATORVASTATIN CALCIUM 10 MG PO TABS: 10.0000 mg | ORAL_TABLET | Freq: Every day | ORAL | Status: DC

## 2018-03-06 MED ORDER — CLONIDINE HCL 0.3 MG PO TABS: 0.3000 mg | ORAL_TABLET | Freq: Three times a day (TID) | ORAL | 11 refills | Status: AC

## 2018-03-06 MED ORDER — LABETALOL HCL 300 MG PO TABS: 300.0000 mg | ORAL_TABLET | Freq: Three times a day (TID) | ORAL | 2 refills | Status: AC

## 2018-03-06 MED ORDER — HYDRALAZINE HCL 100 MG PO TABS: 100.0000 mg | ORAL_TABLET | Freq: Three times a day (TID) | ORAL | 22 refills | Status: AC

## 2018-03-06 MED ORDER — PANTOPRAZOLE SODIUM 40 MG PO TBEC: 40.0000 mg | DELAYED_RELEASE_TABLET | Freq: Every day | ORAL | 2 refills | Status: AC

## 2018-03-06 NOTE — Progress Notes (Signed)
[facility]  CSW signing off.  Blenda NicelyElizabeth Zavion Sleight, [place]CS[place] Clinical Social Worker [phone]

## 2018-03-06 NOTE — Discharge Summary (Addendum)
Stroke Discharge Summary  Patient ID: [name] [name]   MRN: [id number]      DOB: 1966/11/19  Date of Admission: 02/16/2018 Date of Discharge: 03/06/2018  Attending Physician:  Micki Riley, MD, Stroke MD Consultant(s):   Renal service, Fabienne Bruns Vascular surgeon, Faith Rogue, MD (Physical Medicine & Rehabtilitation), critical care medicine team Patient's PCP:  [name], MD  DISCHARGE DIAGNOSIS:  Principal Problem:   Acute intracerebral hemorrhage (HCC)Right BG ICH secondary to uncontrolled hypertension in the setting of CKD stage V Active Problems:   Hypertensive emergency   Diabetes mellitus, type 2 (HCC)   Morbid obesity (HCC)   CKD (chronic kidney disease) stage 4, GFR 15-29 ml/min (HCC)   Acute renal failure (HCC)   ESRD on dialysis (HCC)   Hyperkalemia   Thrombophlebitis R forearm   Anemia, iron deficiency   Past Medical History:  Diagnosis Date  . Cellulitis and abscess of leg 01/08/2014   RT LEG  . Diabetes mellitus   . Diverticulitis   . Hypertension    Past Surgical History:  Procedure Laterality Date  . AV FISTULA PLACEMENT Left 02/24/2018   Procedure: ARTERIOVENOUS (AV) FISTULA CREATION;  Surgeon: Larina Earthly, MD;  Location: [facility] OR;  Service: Vascular;  Laterality: Left;  . COLON SURGERY    . I&D EXTREMITY Right 01/09/2014   Procedure: IRRIGATION AND DEBRIDEMENT EXTREMITY;  Surgeon: Senaida Lange, MD;  Location: [facility] OR;  Service: Orthopedics;  Laterality: Right;  . INSERTION OF DIALYSIS CATHETER Right 02/24/2018   Procedure: INSERTION OF DIALYSIS CATHETER - RIGHT INTERNAL JUGULAR PLACEMENT;  Surgeon: Larina Earthly, MD;  Location: [facility] OR;  Service: Vascular;  Laterality: Right;  . TRANSMETATARSAL AMPUTATION Right 01/15/2014   Procedure: TRANSMETATARSAL AMPUTATION;  Surgeon: Nadara Mustard, MD;  Location[facility] OR;  Service: Orthopedics;  Laterality: Right;    Allergies as of 03/06/2018      Reactions   Minocycline Anaphylaxis   SOB, itching,  hives.   Shellfish Allergy Nausea And Vomiting      Medication List    STOP taking these medications   furosemide 20 MG tablet Commonly known as:  LASIX   Insulin Detemir 100 UNIT/ML Pen Commonly known as:  LEVEMIR   LORazepam 0.5 MG tablet Commonly known as:  ATIVAN   tamsulosin 0.4 MG Caps capsule Commonly known as:  FLOMAX     TAKE these medications   amLODipine 10 MG tablet Commonly known as:  NORVASC Place 1 tablet (10 mg total) into feeding tube daily. Start taking on:  03/07/2018 What changed:  how to take this   aspirin 81 MG EC tablet Take 1 tablet (81 mg total) by mouth daily. Start taking on:  03/07/2018   atorvastatin 10 MG tablet Commonly known as:  LIPITOR Take 1 tablet (10 mg total) by mouth daily at 6 PM.   calcium acetate 667 MG capsule Commonly known as:  PHOSLO Take 2 capsules (1,334 mg total) by mouth 3 (three) times daily with meals.   cephALEXin 500 MG capsule Commonly known as:  KEFLEX Take 1 capsule (500 mg total) by mouth every 12 (twelve) hours for 3 days.   cloNIDine 0.3 MG tablet Commonly known as:  CATAPRES Take 1 tablet (0.3 mg total) by mouth 3 (three) times daily.   hydrALAZINE 100 MG tablet Commonly known as:  APRESOLINE Take 1 tablet (100 mg total) by mouth every 8 (eight) hours.   labetalol 300 MG tablet Commonly known as:  NORMODYNE Take 1 tablet (300 mg total) by mouth 3 (three) times daily. What changed:    medication strength  how much to take  when to take this   pantoprazole 40 MG tablet Commonly known as:  PROTONIX Take 1 tablet (40 mg total) by mouth at bedtime.   senna-docusate 8.6-50 MG tablet Commonly known as:  Senokot-S Take 1 tablet by mouth 2 (two) times daily.       LABORATORY STUDIES CBC    Component Value Date/Time   WBC 11.5 (H) 03/05/2018 0300   RBC 4.08 (L) 03/05/2018 0300   HGB 10.6 (L) 03/05/2018 0300   HCT 32.4 (L) 03/05/2018 0300   PLT 245 03/05/2018 0300   MCV 79.4 03/05/2018  0300   MCH 26.0 03/05/2018 0300   MCHC 32.7 03/05/2018 0300   RDW 15.8 (H) 03/05/2018 0300   LYMPHSABS 1.5 02/20/2018 0416   MONOABS 0.8 02/20/2018 0416   EOSABS 0.3 02/20/2018 0416   BASOSABS 0.0 02/20/2018 0416   CMP    Component Value Date/Time   NA 134 (L) 03/05/2018 0854   K 4.1 03/05/2018 0854   CL 98 (L) 03/05/2018 0854   CO2 24 03/05/2018 0854   GLUCOSE 149 (H) 03/05/2018 0854   BUN 38 (H) 03/05/2018 0854   CREATININE 5.46 (H) 03/05/2018 0854   CALCIUM 9.2 03/05/2018 0854   PROT 7.6 02/16/2018 1714   ALBUMIN 2.3 (L) 03/05/2018 0854   AST 17 02/16/2018 1714   ALT 10 (L) 02/25/2018 0321   ALKPHOS 93 02/16/2018 1714   BILITOT 1.0 02/16/2018 1714   GFRNONAA 11 (L) 03/05/2018 0854   GFRAA 13 (L) 03/05/2018 0854   COAGS Lab Results  Component Value Date   INR 1.17 02/24/2018   INR 1.45 01/19/2014   INR 1.40 01/18/2014   Lipid Panel    Component Value Date/Time   CHOL 164 02/18/2018 0502   TRIG 99 02/18/2018 0502   HDL 36 (L) 02/18/2018 0502   CHOLHDL 4.6 02/18/2018 0502   VLDL 20 02/18/2018 0502   LDLCALC 108 (H) 02/18/2018 0502   HgbA1C  Lab Results  Component Value Date   HGBA1C 6.4 (H) 02/18/2018   Urinalysis    Component Value Date/Time   COLORURINE YELLOW 02/18/2018 0822   APPEARANCEUR CLOUDY (A) 02/18/2018 0822   LABSPEC 1.011 02/18/2018 0822   PHURINE 5.0 02/18/2018 0822   GLUCOSEU 50 (A) 02/18/2018 0822   HGBUR LARGE (A) 02/18/2018 0822   BILIRUBINUR NEGATIVE 02/18/2018 0822   KETONESUR NEGATIVE 02/18/2018 0822   PROTEINUR >=300 (A) 02/18/2018 0822   UROBILINOGEN 0.2 07/31/2015 1735   NITRITE NEGATIVE 02/18/2018 0822   LEUKOCYTESUR SMALL (A) 02/18/2018 0822   Urine Drug Screen     Component Value Date/Time   LABOPIA NONE DETECTED 02/16/2018 1842   COCAINSCRNUR NONE DETECTED 02/16/2018 1842   LABBENZ NONE DETECTED 02/16/2018 1842   AMPHETMU NONE DETECTED 02/16/2018 1842   THCU NONE DETECTED 02/16/2018 1842   LABBARB NONE DETECTED  02/16/2018 1842    Alcohol Level    Component Value Date/Time   ETH <10 02/16/2018 2220      HISTORY OF PRESENT ILLNESS [name] [name] is an 52 y.o. right handed male  with PMH of uncontrolled HTN, CKD, insulin-dependent T2DM who [facility]  Pt reports around 6PM last night 02-15-2018 (last known well), he was sitting ina desk chair and dropped his phone and slipped to the floor. His family tried to get him up but he refused  saying he was fine and would get up. He was still in the floor today 02-16-2018 and so they called EMS.on arrival, he endorsed mild headacheand mild nausea. BP was >200 systolic on arrival. He was started on Cerdene gtt at [place] [facility] and later switched to Cleveprix drip - but still not under control on arrival to [facility]. CT showed a R basal ganglia hemorrhage. NIHSS: 17. Baseline MRS 0. Intracerebral Hemorrhage (ICH) Score 1. He was admitted to the neuro ICU for further evaluation and treatment.     HOSPITAL COURSE [name] [name] is a 52 y.o. male with history of hypertension, chronic kidney disease, diabetes, and right transmetatarsal amputation presenting with headache, nausea, and elevated blood pressure.   Right BG ICH secondary to uncontrolled hypertension in the setting of CKD stage V  Resultant  Right gaze preference, L lower quadrant field cut, L hemiplegia, dysphagia  CT head 02/16/18  Acute right basal ganglia hemorrhage with estimated blood volume of 28 mL.  CT head 02/17/18 R BG IPH w/ 7mm R to L shift. Evolving L frontotemporal scalp contusion. Small vessel disease. Atrophy. 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  2D Echo - Decreased EF 45-50%. Diffuse hypokinesis.  LDL - 108   HgbA1c - 6.4  No antithrombotic prior to admission, now on No antithrombotic d/t hmg.   Ongoing aggressive stroke risk factor management  Therapy recommendations:  LTAC  Disposition: LTAC  at [facility]   Superficial thrombophlebitis R FA  RUE edema -> venous US neg for DVT, show Superficial thrombophlebitis in R cephalic and basilic FA veins. Improving  Leukocytosis, WBC 12.2->11.5 after 2 days abx  Afebrile  Treating with aspirin 81 mg daily - benefits of thrombophlebitis treatment outweighs risk  keflex 500 mg daily x 7 days, D4/7. Continue for total of 7 days   Hypertensive Emergency  BP on arrival 251/158. Initially treated with cardene, then changed to cleviprex. Drip now off.  SBP goal < 180  PTA meds: norvasc 10, lasix 40, labetalol 200 bid  Now on:  norvasc 10, clonidine 0.3 tid, labetolol 300 tid, hydralazine 100 q 8 hours (increased yesterday)  1st HD 02/25/2018  Blood pressure now stabilizing less than 180 consistently  Continue to Monitor BP.   Long-term BP goal normotensive  May need to adjust BP meds if BP continues to drop and remain low  CKD stage V, ESRD  Renal US - No hydronephrosis. Left lower pole 9.8 mm nonobstructing stone.  Elevated creatinine. Treated with bicarb supplement  VVS placed permacath and L FA shunt 02/24/2018  New dialysis 02/25/2017.   CLIP (will be TTS @ [facility])  Hyperlipidemia  Lipid lowering medication PTA:  none  LDL 108  Add low dose statin  Type II Diabetes  HgbA1c 6.4, at goal < 7.0  CBGs 123-168  Was on levimir prior to admission. May need to resume as diet increases  Other Stroke Risk Factors  Morbid Obesity, Body mass index is 41.59 kg/m.  Hx Stroke:  Previous right PCA infarct by imaging.  Hx diastolic dysfuction. No CHF. EF 45 - 50%  Other Active Problems  Anemia 9.0->10.6 - due to CKD. Improving. Replacing Fe IV  Hyperkalemia, resolved    DISCHARGE EXAM Blood pressure (!) 166/82, pulse 82, temperature 99 F (37.2 C), temperature source Oral, resp. rate 19, height 6' 2.02" (1.88 m), weight (!) 147 kg (324 lb 1.2 oz), SpO2 98 %. Exam: Gen:  obese  middle-aged African-American  male, comfortable appearance CV: RRR, no MRG. No Carotid Bruits. No peripheral edema, warm, nontender Eyes: Conjunctivae clear without exudates or hemorrhage Extremities: No toes R foot (amputation). L FA w/ graph site CDI w/ +bruit/thrill. R FA less discolored, no redness or dark areas. Not warm to touch, large nodule anterior just below antecubital remains but is decreasing in size Skin: base of R neck with sutures, CD&I. Mild erythema improving Neuro: Speech:    Speech is dysarthric with normal comprehension.  Cognition:    The patient is oriented to person, month and year Cranial Nerves:    The pupils are equal, round, and reactive to light.  Visual fields are full to finger confrontation. EOMI. Left lower face weakness. Left lower qudrantanopia.  Hearing intact. Voice is normal. Shoulder shrug is normal.  Motor Observation:  no involuntary movements noted. Strength and sensation    Dense left hemiparesis and hemisensory loss grade 0/5 strength on the left and normal on the right. Increasing hip flexor strength on the left Reflex Exam: Toes: Left upgoing    Discharge Diet   DIET DYS 3 renal Thin liquids  DISCHARGE PLAN  Disposition:  LTAC for ongoing PT, OT and SLP  Due to hemorrhage and risk of bleeding, do not take aspirin, aspirin-containing medications, or ibuprofen products. No anticoagulant treatment life long  Hemodialysis three days/week  Ongoing risk factor control by LTAC Physician at time of discharge  Follow-up in [facility] in 4 weeks, office to schedule an appointment.   35 minutes were spent preparing discharge.  Annie Main, MSN, APRN, ANVP-BC, AGPCNP-BC Advanced Practice Stroke Nurse [facility] St[facility] See Amion for Schedule & Pager information 03/06/2018 3:54 PM  I have personally examined this patient, reviewed notes, independently viewed imaging studies, participated in medical decision making and plan of care.ROS  completed by me personally and pertinent positives fully documented  I have made any additions or clarifications directly to the above note. Agree with note above.    Delia Heady, MD Medical Director [facility] Stroke Center Pager: [phone] 03/06/2018 4:12 PM

## 2018-03-06 NOTE — Progress Notes (Signed)
Report called to RN Mikey BussingHaywood at [facility]

## 2018-03-06 NOTE — Progress Notes (Signed)
Physical Therapy Treatment Patient Details Name: [name] ChanceReginald F Myers MRN: [id number] DOB: Oct 13, 1966 Today's Date: 03/06/2018    History of Present Illness 52 yr old male with sig history of CKD Stage Iv, HTN, DM, HFpEF, presents with an acute ICH located in the right basal ganglia with midline shift and surrounding edema. s/p HD catheter placed 3/25.    PT Comments    Pt much more responsive today carrying on conversation with PT/OT during session. Pt participated in UE and LE exercises and directed self care of L side with assist from OT. Pt able to achieve minimal activation of hip flexors and gluteal muscles on L side. Pt requires maxAx2 for rolling to R and modA for rolling to L with assist of bedrail. Continue to recommend L TACH level care at discharge.     Follow Up Recommendations  LTACH     Equipment Recommendations  Other (comment)(TBD)    Recommendations for Other Services       Precautions / Restrictions Precautions Precautions: Fall Restrictions Weight Bearing Restrictions: No    Mobility  Bed Mobility Overal bed mobility: Needs Assistance Bed Mobility: Rolling Rolling: Max assist;Min assist(min A to roll toward L; Max A to rollR)                Balance     Sitting balance-Leahy Scale: Fair Sitting balance - Comments: R bias but able to self correct to midlien with cues                                    Cognition Arousal/Alertness: Awake/alert Behavior During Therapy: Flat affect Overall Cognitive Status: Impaired/Different from baseline Area of Impairment: Attention;Safety/judgement;Awareness;Problem solving                   Current Attention Level: Selective   Following Commands: Follows one step commands consistently Safety/Judgement: Decreased awareness of deficits Awareness: Emergent Problem Solving: Slow processing;Difficulty sequencing;Requires verbal cues General Comments: engaged in session. 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 [employer], has 2 childresn and discussed what his kids are doing in school      Exercises General Exercises - Lower Extremity Ankle Circles/Pumps: Right;AROM;10 reps;Supine Quad Sets: AROM;10 reps;Right;Supine Gluteal Sets: AROM;10 reps;Supine;Both Heel Slides: AROM;Right;10 reps;Supine Straight Leg Raises: AROM;Right;10 reps;Supine Other Exercises Other Exercises: LUE PROM as tolerated Other Exercises: A/AAROM gross grasp - pt demosntrates minimal active finger and thumb flexion after facilitation Other Exercises: after facilitation, pt demosntrates initiation of shoulder elevation        Pertinent Vitals/Pain Pain Assessment: Faces Faces Pain Scale: Hurts little more Pain Location: (with LU/LE ROM) Pain Descriptors / Indicators: Grimacing Pain Intervention(s): Limited activity within patient's tolerance;Monitored during session           PT Goals (current goals can now be found in the care plan section) Acute Rehab PT Goals Patient Stated Goal: get better PT Goal Formulation: With patient Time For Goal Achievement:  Potential to Achieve Goals: Fair Progress towards PT goals: Progressing toward goals    Frequency    Min 3X/week      PT Plan Current plan remains appropriate    Co-evaluation PT/OT/SLP Co-Evaluation/Treatment: Yes Reason for Co-Treatment: Complexity of the patient's impairments (multi-system involvement) PT goals addressed during session: Strengthening/ROM        AM-PAC PT "6 Clicks" Daily Activity  Outcome Measure  Difficulty turning over in bed (including adjusting bedclothes, sheets and blankets)?: Unable  Difficulty moving from lying on back to sitting on the side of the bed? : Unable Difficulty sitting down on and standing up from a chair with arms (e.g., wheelchair, bedside commode, etc,.)?: Unable Help needed moving to and from a bed to chair (including a wheelchair)?: Total Help needed walking in hospital room?:  Total Help needed climbing 3-5 steps with a railing? : Total 6 Click Score: 6    End of Session   Activity Tolerance: Patient tolerated treatment well Patient left: in bed;with call bell/phone within reach;with bed alarm set Nurse Communication: Mobility status;Need for lift equipment PT Visit Diagnosis: Hemiplegia and hemiparesis;Other abnormalities of gait and mobility (R26.89);Other symptoms and signs involving the nervous system (R29.898) Hemiplegia - Right/Left: Left Hemiplegia - dominant/non-dominant: Non-dominant Hemiplegia - caused by: Nontraumatic SAH     Time: 4098-1191 PT Time Calculation (min) (ACUTE ONLY): 35 min  Charges:  $Therapeutic Exercise: 8-22 mins                    G Codes:       Samariah Hokenson B. Beverely [name], DPT Acute Rehabilitation  [phone] Pager [phone]     Elon Alas [facility] 03/06/2018, 6:19 PM

## 2018-03-06 NOTE — Progress Notes (Addendum)
STROKE TEAM PROGRESS NOTE   SUBJECTIVE (INTERVAL HISTORY) Patient sleeping. Startled when I woke him. No new complaints, stable. Await LTAC bed.  CBC:  Recent Labs  Lab 03/03/18 0354 03/05/18 0300  WBC 12.2* 11.5*  HGB 10.5* 10.6*  HCT 33.8* 32.4*  MCV 80.9 79.4  PLT 237 245    Basic Metabolic Panel:  Recent Labs  Lab 03/01/18 0117  03/03/18 0354 03/05/18 0854  NA 140   < > 140 134*  K 3.8   < > 3.9 4.1  CL 105   < > 106 98*  CO2 25   < > 23 24  GLUCOSE 121*   < > 107* 149*  BUN 18   < > 26* 38*  CREATININE 2.77*   < > 3.67* 5.46*  CALCIUM 8.4*   < > 9.1 9.2  PHOS 3.3  --   --  6.1*   < > = values in this interval not displayed.    IMAGING  No results found.   Vitals:   03/05/18 1655 03/05/18 2035 03/06/18 0451 03/06/18 0832  BP: (!) 168/96 (!) 149/85 (!) 154/91 (!) 148/82  Pulse:  80 76 74  Resp:  18 18 19   Temp:  98.2 F (36.8 C) 98.4 F (36.9 C) 98.5 F (36.9 C)  TempSrc:  Oral Oral Oral  SpO2:  98% 94% 95%  Weight:      Height:       Exam: Gen:  obese middle-aged African-American male, comfortable appearance CV: RRR, no MRG. No Carotid Bruits. No peripheral edema, warm, nontender Eyes: Conjunctivae clear without exudates or hemorrhage Extremities: No toes R foot (amputation). L FA w/ graph site CDI w/ +bruit/thrill. R FA less discolored, no redness or dark areas. Not warm to touch, large nodule anterior just below antecubital remains but is decreasing in size Skin: base of R neck with sutures, CD&I. Mild erythema improving Neuro: Speech:    Speech is dysarthric with normal comprehension.  Cognition:    The patient is oriented to person, month and year Cranial Nerves:    The pupils are equal, round, and reactive to light.  Visual fields are full to finger confrontation. EOMI. Left lower face weakness. Left lower qudrantanopia.  Hearing intact. Voice is normal. Shoulder shrug is normal.  Motor Observation:  no involuntary movements noted. Strength  and sensation    Dense left hemiparesis and hemisensory loss grade 0/5 strength on the left and normal on the right. Increasing hip flexor strength on the left Reflex Exam: Toes: Left upgoing     ASSESSMENT/PLAN [name] [name] is a 52 y.o. male with history of hypertension, chronic kidney disease, diabetes, and right transmetatarsal amputation presenting with headache, nausea, and elevated blood pressure.   Right BG ICH secondary to uncontrolled hypertension in the setting of CKD stage V  Resultant  Right gaze preference, L lower quadrant field cut, L hemiplegia, dysphagia  CT head 02/16/18  Acute right basal ganglia hemorrhage with estimated blood volume of 28 mL.  CT head 02/17/18 R BG IPH w/ 7mm R to L shift. Evolving L frontotemporal scalp contusion. Small vessel disease. Atrophy. 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  2D Echo - Decreased EF 45-50%. Diffuse hypokinesis.  LDL - 108   HgbA1c - 6.4  VTE prophylaxis - Heparin 5000 units sq tid  Fall precautions DIET DYS 3 Room service appropriate? Yes; Fluid consistency: Thin. ST following  No antithrombotic prior to admission,  now on No antithrombotic d/t hmg  Ongoing aggressive stroke risk factor management  Therapy recommendations:  SNF vs LTAC, w/c w/ cushion  Disposition: atncipate LTAC at [facility] tomorrow. Case manager following  Superficial thrombophlebitis R FA  RUE edema -> venous US neg for DVT, show Superficial thrombophlebitis in R cephalic and basilic FA veins. Improving  Leukocytosis, WBC 12.2->11.5 after 2 days abx  Afebrile  Treating with aspirin 81 mg daily   keflex 500 mg daily x 7 days, D4/7   Hypertensive Emergency  BP on arrival 251/158. Initially treated with cardene, then changed to cleviprex. Drip now off.  SBP goal < 180  PTA meds: norvasc 10, lasix 40, labetalol 200 bid  Now on:  norvasc 10, clonidine 0.3 tid, labetolol 300 tid, hydralazine  100 q 8 hours (increased yesterday)  1st HD 02/25/2018  Blood pressure now stabilizing less than 180 consistently  Continue to Monitor BP.   Long-term BP goal normotensive  May need to adjust BP meds if BP continues to drop and remain low  CKD stage V, ESRD  Renal US - No hydronephrosis. Left lower pole 9.8 mm nonobstructing stone.  Elevated creatinine. Treated with bicarb supplement  VVS placed permacath and L FA shunt 02/24/2018  New dialysis 02/25/2017.   To HD today  CLIP (will be TTS @ [facility])  Nephrology following  Hyperlipidemia  Lipid lowering medication PTA:  none  LDL 108  Consider statin at discharge   Type II Diabetes  HgbA1c 6.4, at goal < 7.0  CBGs 123-168  Controlled  SSI  Other Stroke Risk Factors  Morbid Obesity, Body mass index is 41.59 kg/m.  Hx Stroke:  Previous right PCA infarct by imaging.  Hx diastolic dysfuction. No CHF. EF 45 - 50%  Other Active Problems  Anemia 9.0->10.6 - due to CKD. Improving. Replacing Fe IV  Hyperkalemia, resolved   Hospital day # 18  [name] Main, MSN, APRN, ANVP-BC, AGPCNP-BC Advanced Practice Stroke Nurse [facility] Stroke Center See Amion for Schedule & Pager information 03/06/2018 10:17 AM  I have personally examined this patient, reviewed notes, independently viewed imaging studies, participated in medical decision making and plan of care.ROS completed by me personally and pertinent positives fully documented  I have made any additions or clarifications directly to the above note. Agree with note above. Discussed with Child psychotherapist. Possibility of bed at [facility] today.  Delia Heady, MD Medical Director [facility] Stroke Center Pager: [phone] 03/06/2018 3:38 PM  To contact Stroke Continuity provider, please refer to [email or web address]. After hours, contact General Neurology

## 2018-03-06 NOTE — Progress Notes (Signed)
Transported to [facility] by [employer]

## 2018-03-06 NOTE — Progress Notes (Addendum)
Late yesterday: Received phone call from PassapatanzyMillie at [employer] that [facility] is not in Network with [employer]. She states that [facility] is in network and that an out of network facility would be a 50%/50% split with the policy holder. Millie asked that I reach out to Mrs Rayfield CitizenLeake about this cost.  CM spoke to Mrs Rayfield CitizenLeake and she states she nor her spouse can afford this. She will need an in network facility. Mrs Rayfield CitizenLeake stated she would do some research and CM notified her that CM would reach out to [facility] for other facilities near [place] since [place] beds. Carinna with [facility] states they still have no HD beds and that [facility] doesn't either.   This am: Reached out to [facility] and they have beds and are in network but will not offer a bed d/t d/c disposition issues. They feel they would be unable to place him at d/c back in the [place] area.  CM received phone call from FultonKatie at [employer] and she states that since there are no LTACH facilities with in [place] of [place] with available beds that they will work on an out of network (network deficiency) with [facility]. They state this may take until tomorrow to hear the results. Monia Pouchetna still would then need to authorize him going to SNF. CM updated Loury with [facility] and Mrs Rayfield CitizenLeake.   1630: Received authorization for patient to go to [facility] under in network cost. Auth number:: [id number] MD, Pt, bedside RN and [facility] at [facility] notified. Awaiting accepting MD and room number to update Mrs Rayfield CitizenLeake.   1700: received room number at [facility] (205). Left voice mail for Mrs Rayfield CitizenLeake informing her of transfer and room number and phone number for [facility]. CM provided bedside RN with room number and number to call for report. CM placed transfer form on front of chart and called [facility] for transport. Bedside RN to complete EMTALA form and send with pt.

## 2018-03-06 NOTE — Progress Notes (Signed)
Occupational Therapy Treatment Patient Details Name: [name] [name] MRN: [id number] DOB: 1966-06-16 Today's Date: 03/06/2018    History of present illness [age] yr old male with sig history of CKD Stage Iv, HTN, DM, HFpEF, presents with an acute ICH located in the right basal ganglia with midline shift and surrounding edema. s/p HD catheter placed 3/25.   OT comments  Pt seen for 2 visits today, initially as a co-treat with PT then to mobilize to chair using Maximove. 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 Required Min A to roll toward L and max A toward R. Demonstrates minimal active movement in LUE with finger movement and shoulder shrug when movement facilitated and pt is attending to LUE. Good participation today. Recommend rehab at SNF.   Follow Up Recommendations  SNF    Equipment Recommendations  Other (comment)(TBA)    Recommendations for Other Services      Precautions / Restrictions Precautions Precautions: Fall       Mobility Bed Mobility Overal bed mobility: Needs Assistance Bed Mobility: Rolling Rolling: Max assist;Min assist(min A to roll toward L; Max A to rollR)    Worked on facilitation of trunk and shoulder girdle during bed mobility        Transfers                 General transfer comment: used maximove to mobilize pt to chair    Balance     Sitting balance-Leahy Scale: Fair Sitting balance - Comments: R bias but able to self correct to midlien with cues                                   ADL either performed or assessed with clinical judgement   ADL Overall ADL's : Needs assistance/impaired     Grooming: Moderate assistance;Bed level Grooming Details (indicate cue type and reason): assisted with washing under LUE and applying deoderant    Hand over hand used to incorporate use of LUE. vc to visually attend to  LUE during task                                 Vision       Perception     Praxis      Cognition Arousal/Alertness: Awake/alert Behavior During Therapy: Flat affect Overall Cognitive Status: Impaired/Different from baseline Area of Impairment: Attention;Safety/judgement;Awareness;Problem solving                   Current Attention Level: Selective   Following Commands: Follows one step commands consistently Safety/Judgement: Decreased awareness of deficits Awareness: Emergent Problem Solving: Slow processing;Difficulty sequencing;Requires verbal cues General Comments: engaged in session. 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 [employer], has 2 childresn and discussed what his kids are doing in school        Exercises Exercises: Other exercises Other Exercises Other Exercises: LUE PROM as tolerated Other Exercises: A/AAROM gross grasp - pt demonstrates minimal active finger and thumb flexion after facilitation Other Exercises: after facilitation, pt demosntrates initiation of shoulder elevation   Shoulder Instructions       General Comments      Pertinent Vitals/ Pain       Pain Assessment: Faces Faces Pain Scale: Hurts little more  Pain Location: (with LU/LE ROM) Pain Descriptors / Indicators: Grimacing Pain Intervention(s): Limited activity within patient's tolerance  Home Living                                          Prior Functioning/Environment              Frequency  Min 3X/week        Progress Toward Goals  OT Goals(current goals can now be found in the care plan section)  Progress towards OT goals: Progressing toward goals  Acute Rehab OT Goals Patient Stated Goal: get better OT Goal Formulation: With patient Time For Goal Achievement: 03/20/18 Potential to Achieve Goals: Good ADL Goals Pt Will Perform Grooming: with min assist Pt/caregiver will Perform Home Exercise Program: Left upper extremity Additional  ADL Goal #1: Pt will be Mod A +2 for up to EOB to work on sitting balance, ADLs, and vision at beside Additional ADL Goal #2: Pt will be able to find items in all visual fields when asked with less than or equal to 1 vc per item Additional ADL Goal #3: Pt will be able sit EOB with min A consistently  Plan Discharge plan needs to be updated    Co-evaluation    PT/OT/SLP Co-Evaluation/Treatment: Yes Reason for Co-Treatment: Complexity of the patient's impairments (multi-system involvement);To address functional/ADL transfers   OT goals addressed during session: ADL's and self-care;Strengthening/ROM      AM-PAC PT "6 Clicks" Daily Activity     Outcome Measure   Help from another person eating meals?: A Little Help from another person taking care of personal grooming?: A Little Help from another person toileting, which includes using toliet, bedpan, or urinal?: A Lot Help from another person bathing (including washing, rinsing, drying)?: A Lot Help from another person to put on and taking off regular upper body clothing?: A Lot Help from another person to put on and taking off regular lower body clothing?: Total 6 Click Score: 13    End of Session    OT Visit Diagnosis: Hemiplegia and hemiparesis;Other symptoms and signs involving cognitive function;Muscle weakness (generalized) (M62.81);Other abnormalities of gait and mobility (R26.89) Hemiplegia - Right/Left: Left Hemiplegia - dominant/non-dominant: Dominant Hemiplegia - caused by: Nontraumatic intracerebral hemorrhage   Activity Tolerance Patient tolerated treatment well   Patient Left in chair;with call bell/phone within [name];with chair alarm set   Nurse Communication Mobility status;Need for lift equipment(Maximove)        Time: 1610-9604  OT Time Calculation (min): 35 min  Time: 2nd session 1320-1334 (14 min)  Charges: OT General Charges $OT Visit: (2 visits) OT Treatments $Self Care/Home Management : 8-22  mins $Therapeutic Activity: 8-22 mins  [facility], OT/L  [phone] 03/06/2018   [name] [name],[name] [name] 03/06/2018, 2:35 PM

## 2018-03-06 NOTE — Progress Notes (Signed)
[facility] Progress Note    Subjective:   No complaints   Objective:   BP (!) 148/82 (BP Location: Right Arm)   Pulse 74   Temp 98.5 F (36.9 C) (Oral)   Resp 19   Ht 6' 2.02" (1.88 m)   Wt (!) 147 kg (324 lb 1.2 oz)   SpO2 95%   BMI 41.59 kg/m   Intake/Output: I/O last 3 completed shifts: In: 490 [P.O.:480; I.V.:10] Out: 3500 [Other:3500]   Intake/Output this shift:  No intake/output data recorded. Weight change:   Physical Exam: Gen: NAD CVS: no rub Resp: cta  ZOX:WRUEAVAbd:benign Ext: LAVF +T/B, trace pretibial edema  Labs: BMET Recent Labs  Lab 02/28/18 0746 02/28/18 1821 02/28/18 2024 03/01/18 0117 03/01/18 0558 03/02/18 0327 03/03/18 0354 03/05/18 0854  NA 144  --  139 140 140 137 140 134*  K 4.1  --  3.9 3.8 3.9 3.9 3.9 4.1  CL 111  --  104 105 105 102 106 98*  CO2 24  --  26 25 25 23 23 24   GLUCOSE 143*  --  123* 121* 109* 131* 107* 149*  BUN 28*  --  16 18 19 18  26* 38*  CREATININE 3.78*  --  2.52* 2.77* 2.94* 2.97* 3.67* 5.46*  ALBUMIN  --   --  2.3* 2.2*  --   --   --  2.3*  CALCIUM 8.5*  --  8.3* 8.4* 8.5* 8.7* 9.1 9.2  PHOS  --  2.9 3.2 3.3  --   --   --  6.1*   CBC Recent Labs  Lab 03/01/18 0558 03/02/18 0327 03/03/18 0354 03/05/18 0300  WBC 9.9 10.5 12.2* 11.5*  HGB 9.7* 10.3* 10.5* 10.6*  HCT 31.4* 33.3* 33.8* 32.4*  MCV 82.8 81.2 80.9 79.4  PLT 195 196 237 245    @IMGRELPRIORS @ Medications:    . amLODipine  10 mg Per Tube Daily  . aspirin EC  81 mg Oral Daily  . calcium acetate  1,334 mg Oral TID WC  . cephALEXin  500 mg Oral Q12H  . chlorhexidine  15 mL Mouth Rinse BID  . Chlorhexidine Gluconate Cloth  6 each Topical Q0600  . cloNIDine  0.3 mg Oral TID  . heparin injection (subcutaneous)  5,000 Units Subcutaneous Q8H  . hydrALAZINE  100 mg Oral Q8H  . insulin aspart  0-15 Units Subcutaneous Q4H  . labetalol  300 mg Oral TID  . mouth rinse  15 mL Mouth Rinse q12n4p  . pantoprazole  40 mg Oral QHS  .  senna-docusate  1 tablet Oral BID  . sodium chloride flush  10-40 mL Intracatheter Q12H     Assessment/ Plan:   1. 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 ESRDtolerating HD. Has been accepted at [facility], however he needs to be able to sit in a chair for more than 4 hours before he can go to outpatient dialysis. He has dense left hemiparesis/hemiplegiaand not sure he will be able to tolerate outpatient dialysis or be able to transfer from wheelchair to recliner at the outpatient setting..  3. Anemia:stable 4. CKD-MBD:stable, on phoslo 5. Nutrition:renal diet 6. Vascular access- RIJ TDC and LAVF created on 02/24/18 by Dr. Arbie CookeyEarly 7. Hypertension:cont to challenge with HD and UF but remains elevated. 8. RUE edema- had dopplers done waiting for results, [facility].  9. Disposition- will need to be able to tolerate  over 4 hours in a chair before he can go to outpatient dialysis. He may require inpatient rehab or LTC facility placement if not able to do so.According to the notes, family is amenable to LTC placement.    Irena Cords, MD [facility], [facility] Pager [phone] 03/06/2018, 11:20 AM

## 2018-03-17 ENCOUNTER — Inpatient Hospital Stay
Admission: AD | Admit: 2018-03-17 | Payer: Self-pay | Source: Other Acute Inpatient Hospital | Admitting: Pulmonary Disease

## 2018-04-02 DEATH — deceased

## 2019-05-28 IMAGING — DX DG CHEST 1V PORT
1 series · 1 of 1 positions shown · non-contrast
Comparison: Study obtained earlier in the day

CLINICAL DATA: Central catheter placement

EXAM:
PORTABLE CHEST 1 VIEW

[chest]
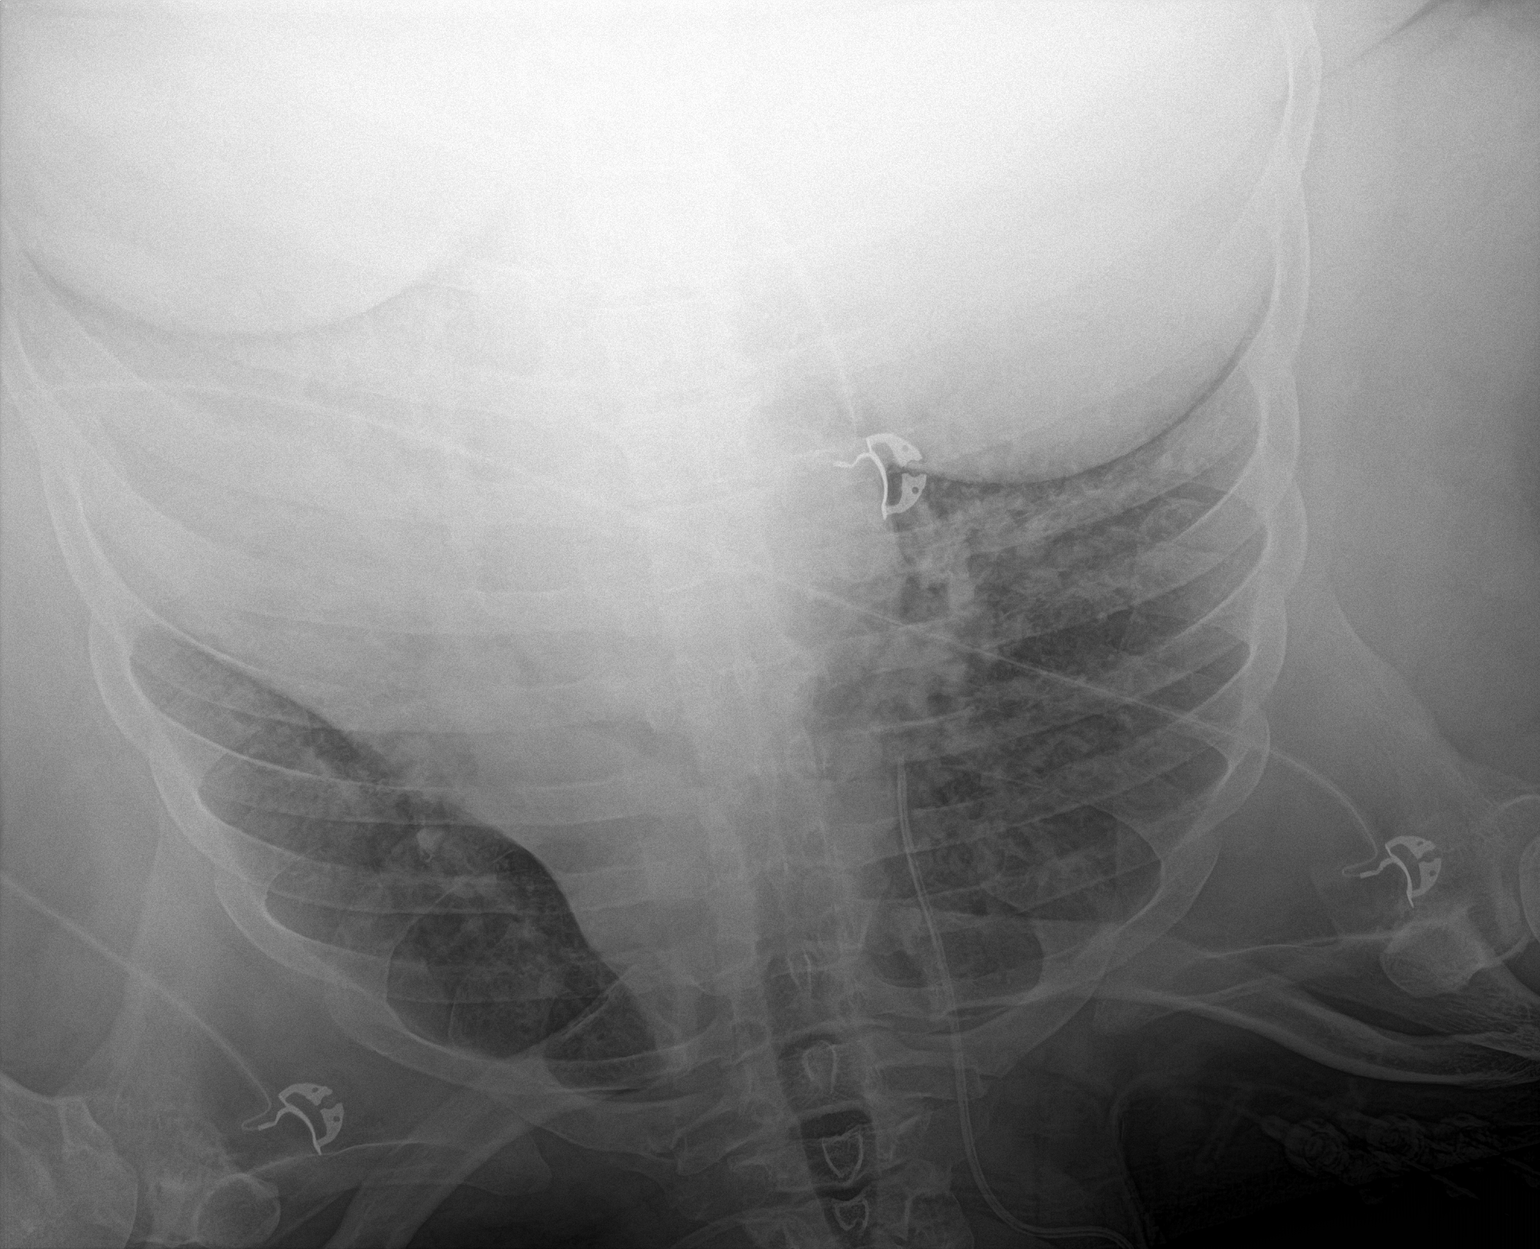

[1 of 1 positions shown; findings below may reference images not displayed]

FINDINGS: Central catheter tip is in the superior vena cava. No pneumothorax.
There is no appreciable edema or consolidation. There is stable
cardiomegaly. Pulmonary vascular is normal. No adenopathy. No bone
lesions.
IMPRESSION: Central catheter tip in superior vena cava. No pneumothorax. Stable
cardiomegaly. No edema or consolidation.

## 2019-05-28 IMAGING — DX DG CHEST 1V PORT
1 series · 1 of 1 positions shown · non-contrast
Comparison: Radiograph earlier this day.

CLINICAL DATA: Acute respiratory failure with hypoxia.

EXAM:
PORTABLE CHEST 1 VIEW

[chest ap]
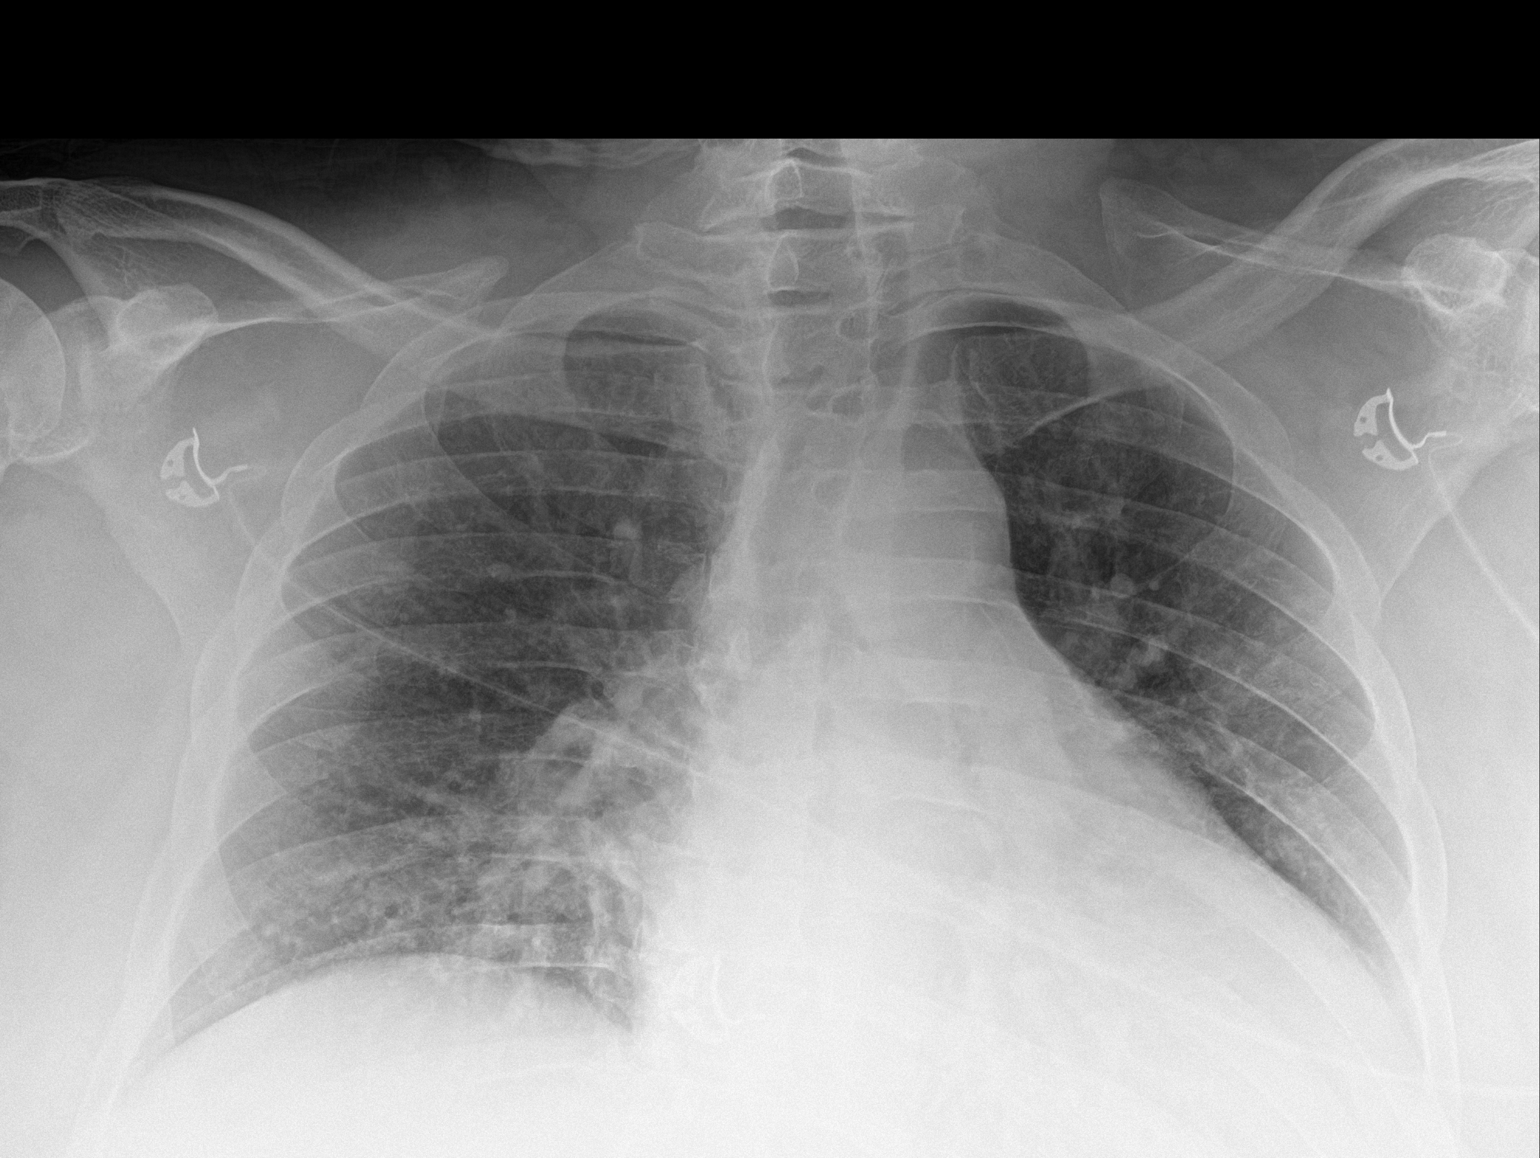

[1 of 1 positions shown; findings below may reference images not displayed]

FINDINGS: Unchanged cardiomegaly and mediastinal contours. Unchanged
retrocardiac opacity. Mild bronchial thickening. No new focal
airspace disease. No pneumothorax.
IMPRESSION: 1. Unchanged retrocardiac opacity which may be combination of
pleural fluid and atelectasis or airspace disease.
2. Unchanged cardiomegaly.
3. Mild central bronchial thickening.

## 2019-05-28 IMAGING — CT CT CERVICAL SPINE W/O CM
4 of 11 series · 10 of 33 positions shown, 11 images · non-contrast
Comparison: None.

ADDENDUM:
Critical Value/emergent results were called by telephone at the time
of interpretation on 02/16/2018 at 2460 hours. to Dr. SUSANA MATOS NOSBUSCH , who
verbally acknowledged these results.
CLINICAL DATA: 52-year-old male found down.

EXAM:
CT HEAD WITHOUT CONTRAST
CT CERVICAL SPINE WITHOUT CONTRAST
TECHNIQUE: Multidetector CT imaging of the head and cervical spine was
performed following the standard protocol without intravenous
contrast. Multiplanar CT image reconstructions of the cervical spine
were also generated.

[Series 9: coronal soft tissue · coronal · 0.24mm/px · 1 of 84 slices shown]
[im 42/84  bone]
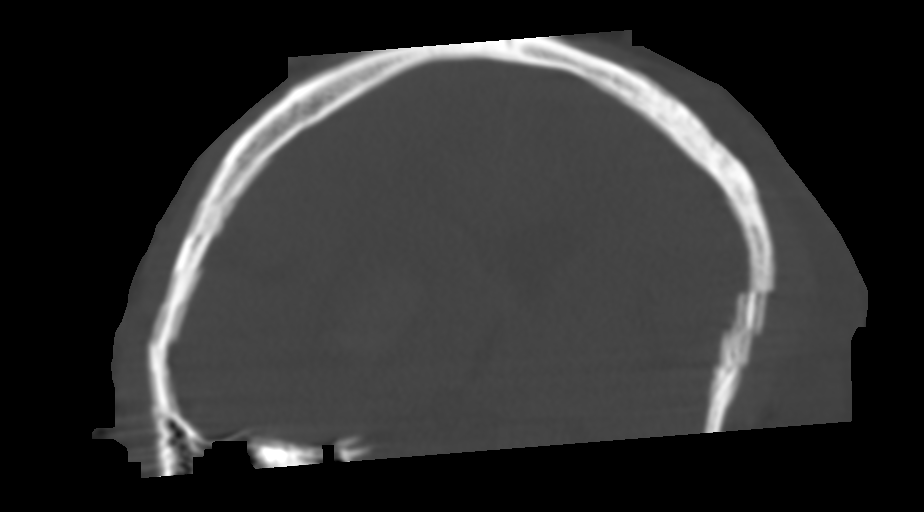

[Series 13: c spine soft · axial · 0.29mm/px · z∈[+288,+368]mm · 3 of 81 slices shown]
[im 21/81  soft-tissue]
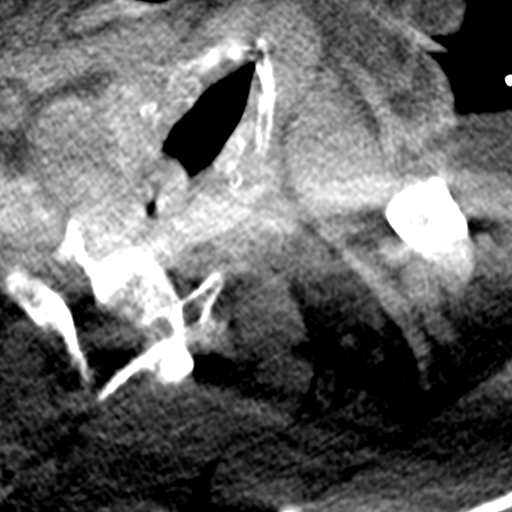
[im 41/81  soft-tissue]
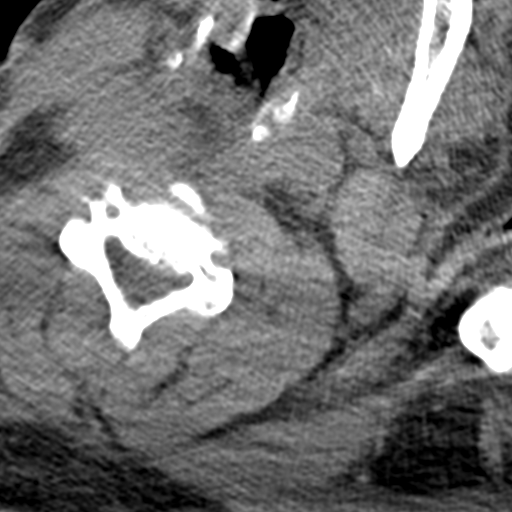
[im 61/81  soft-tissue]
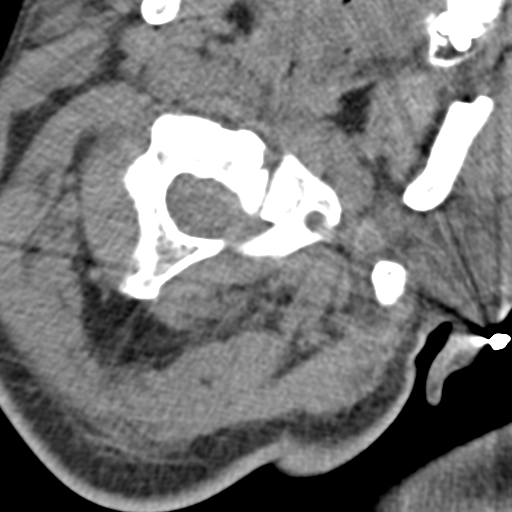

[Series 14: orthogonal bone · axial · 0.28mm/px · z∈[+259,+358]mm · 4 of 89 slices shown, 5 images]
[im 18/89  soft-tissue]
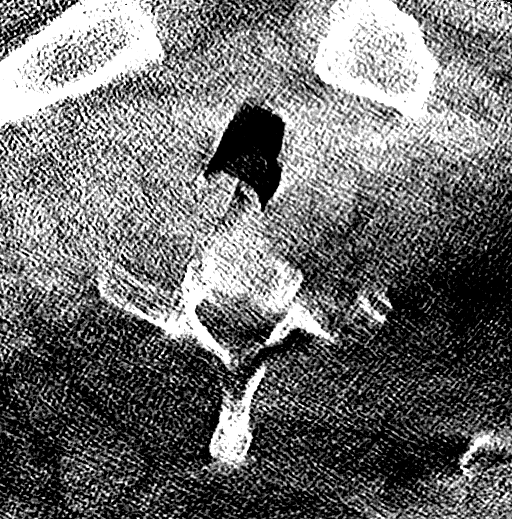
[im 18/89  bone]
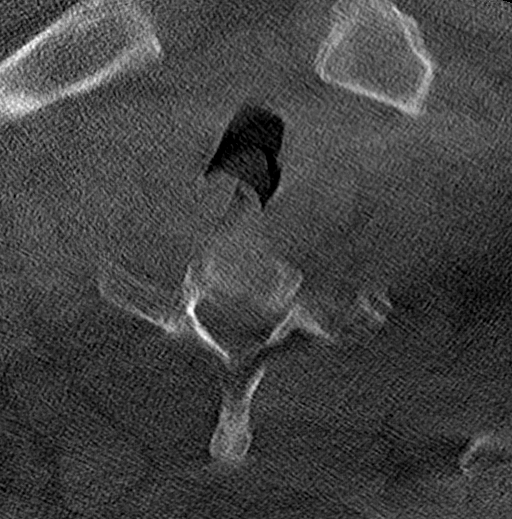
[im 36/89  bone]
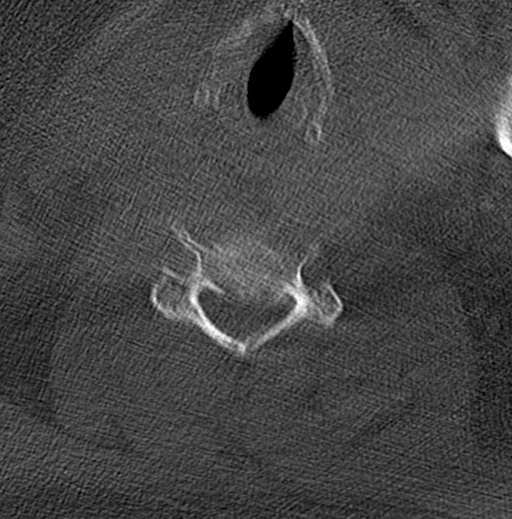
[im 53/89  bone]
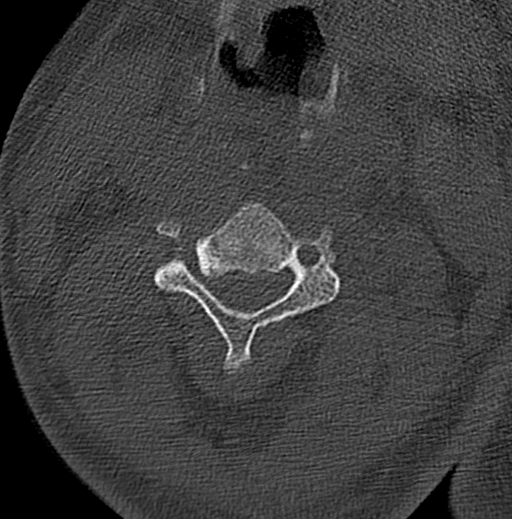
[im 71/89  bone]
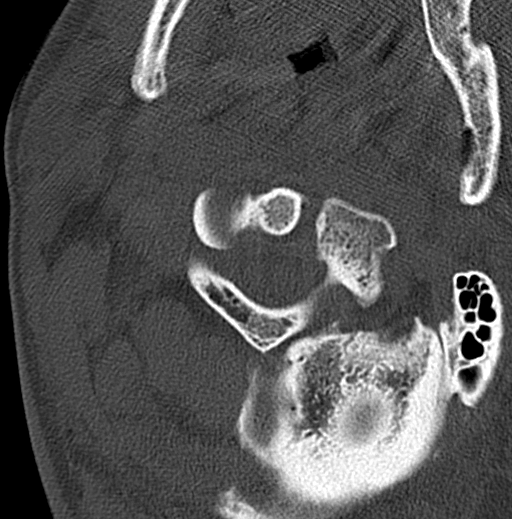

[Series 16: sagittal bone · sagittal · 0.31mm/px · 2 of 61 slices shown]
[im 21/61  bone]
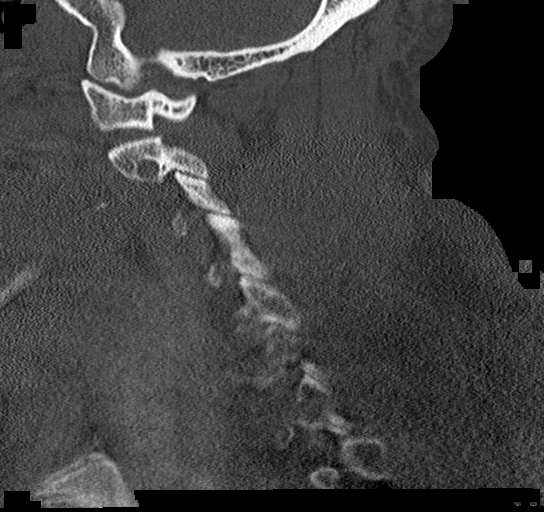
[im 41/61  bone]
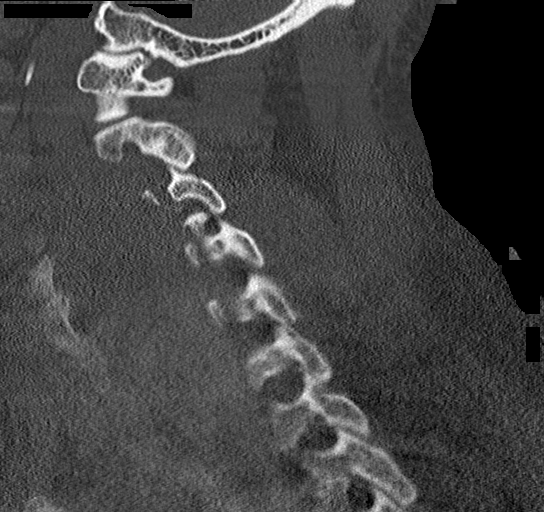

[10 of 33 positions shown; findings below may reference images not displayed]

FINDINGS: Study is mildly degraded by motion artifact despite repeated imaging
attempts.

CT HEAD FINDINGS

Brain: Oval hyperdense intra-axial hemorrhage centered at the right
basal ganglia encompassing 57 x 30 x 33 millimeters (AP by
transverse by CC) for an estimated blood volume of 28 milliliters.
Surrounding edema. Mass effect with leftward midline shift of 8-9
millimeters.

Effaced right lateral ventricle, but no intraventricular hemorrhage.
No ventriculomegaly.

No other acute intracranial hemorrhage identified. The basilar
cisterns are normal.

Chronic appearing encephalomalacia in the right occipital pole.
Confluent bilateral cerebral white matter hypodensity.

Vascular: Degraded by motion, No suspicious intracranial vascular
hyperdensity.

Skull: Bone detail limited by motion, no acute osseous abnormality
identified.

Sinuses/Orbits: Clear.

Other: Large generalized left side scalp and face superficial soft
tissue hematoma measuring up to 11 millimeters in thickness. The
forehead is affected. The findings continue along the lateral and
anterior left orbit. Grossly negative orbits soft tissues, degraded
by motion. The right scalp appears relatively spared.

CT CERVICAL SPINE FINDINGS

Alignment: Straightening and mild reversal of cervical lordosis.
Cervicothoracic junction alignment is within normal limits.
Bilateral posterior element alignment is within normal limits.

Skull base and vertebrae: Lower cervical spine bone detail is
decreased by large body habitus. Visualized skull base is intact. No
atlanto-occipital dissociation. No cervical spine fracture
identified.

Soft tissues and spinal canal: There is a retropharyngeal course of
both carotid arteries in the neck. Grossly negative noncontrast deep
paraspinal soft tissues.

Disc levels: There is broad-based right foraminal disc osteophyte
degeneration at C3-C4 with severe right C4 foraminal stenosis. No
other significant cervical spine degeneration is evident.

Upper chest: The visible T1 level appears intact.
IMPRESSION: 1. Acute right basal ganglia hemorrhage with estimated blood volume
of 28 mL. Underlying chronic cerebral ischemia, including previous
right PCA infarct.
2. Surrounding edema with regional mass effect including leftward
midline shift up to 9 mm. Basilar cisterns are patent.
3. Subtotal effacement of the right lateral ventricle, but no
intraventricular extension of blood and no ventriculomegaly.
4. Large left scalp hematoma. No underlying skull fracture
identified.
5.  No acute fracture in the cervical spine.
